# Patient Record
Sex: Male | Born: 1955 | Race: Black or African American | Hispanic: No | Marital: Single | Smoking: Current every day smoker
Health system: Southern US, Community
[De-identification: ages and names within clinical notes are randomized; demographics above are authoritative.]

## PROBLEM LIST (undated history)

## (undated) DIAGNOSIS — K4030 Unilateral inguinal hernia, with obstruction, without gangrene, not specified as recurrent: Secondary | ICD-10-CM

## (undated) DIAGNOSIS — Z9289 Personal history of other medical treatment: Secondary | ICD-10-CM

## (undated) DIAGNOSIS — F1020 Alcohol dependence, uncomplicated: Secondary | ICD-10-CM

## (undated) HISTORY — PX: NO PAST SURGERIES: SHX2092

---

## 2013-08-23 ENCOUNTER — Emergency Department: Payer: Self-pay | Admitting: Emergency Medicine

## 2013-08-23 LAB — BASIC METABOLIC PANEL
ANION GAP: 7 (ref 7–16)
BUN: 26 mg/dL — AB (ref 7–18)
CALCIUM: 9.5 mg/dL (ref 8.5–10.1)
Chloride: 96 mmol/L — ABNORMAL LOW (ref 98–107)
Co2: 25 mmol/L (ref 21–32)
Creatinine: 0.94 mg/dL (ref 0.60–1.30)
EGFR (African American): >60
EGFR (Non-African Amer.): >60
GLUCOSE: 113 mg/dL — AB (ref 65–99)
Osmolality: 263 (ref 275–301)
Potassium: 3.3 mmol/L — ABNORMAL LOW (ref 3.5–5.1)
SODIUM: 128 mmol/L — AB (ref 136–145)

## 2013-08-23 LAB — URINALYSIS, COMPLETE
BILIRUBIN, UR: NEGATIVE
Glucose,UR: NEGATIVE mg/dL (ref 0–75)
Granular Cast: 3
Leukocyte Esterase: NEGATIVE
NITRITE: NEGATIVE
Ph: 5 (ref 4.5–8.0)
SPECIFIC GRAVITY: 1.025 (ref 1.003–1.030)
Squamous Epithelial: 1

## 2013-08-23 LAB — DRUG SCREEN, URINE

## 2013-08-23 LAB — ETHANOL
Ethanol %: <0.003 % (ref 0.000–0.080)
Ethanol: <3 mg/dL

## 2013-08-23 LAB — PROTIME-INR
INR: 0.9
Prothrombin Time: 12.5 secs (ref 11.5–14.7)

## 2013-08-23 LAB — CBC
HCT: 38.3 % — ABNORMAL LOW (ref 40.0–52.0)
HGB: 13 g/dL (ref 13.0–18.0)
MCH: 33.7 pg (ref 26.0–34.0)
MCHC: 33.8 g/dL (ref 32.0–36.0)
MCV: 100 fL (ref 80–100)
PLATELETS: 106 10*3/uL — AB (ref 150–440)
RBC: 3.85 10*6/uL — ABNORMAL LOW (ref 4.40–5.90)
RDW: 13.2 % (ref 11.5–14.5)
WBC: 8.7 10*3/uL (ref 3.8–10.6)

## 2013-08-23 LAB — PHOSPHORUS: Phosphorus: 2.2 mg/dL — ABNORMAL LOW (ref 2.5–4.9)

## 2013-08-23 LAB — PRO B NATRIURETIC PEPTIDE: B-Type Natriuretic Peptide: 52 pg/mL (ref 0–125)

## 2013-08-23 LAB — MAGNESIUM: MAGNESIUM: 1.4 mg/dL — AB

## 2013-08-23 LAB — TROPONIN I

## 2013-08-28 LAB — CULTURE, BLOOD (SINGLE)

## 2016-06-21 ENCOUNTER — Inpatient Hospital Stay
Admission: EM | Admit: 2016-06-21 | Discharge: 2016-06-25 | DRG: 897 | Disposition: A | Discharge status: Home Health | Payer: Self-pay | Attending: Internal Medicine | Admitting: Internal Medicine

## 2016-06-21 ENCOUNTER — Encounter: Payer: Self-pay | Admitting: *Deleted

## 2016-06-21 ENCOUNTER — Emergency Department: Payer: Self-pay

## 2016-06-21 DIAGNOSIS — F10230 Alcohol dependence with withdrawal, uncomplicated: Principal | ICD-10-CM | POA: Diagnosis present

## 2016-06-21 DIAGNOSIS — Y93E1 Activity, personal bathing and showering: Secondary | ICD-10-CM

## 2016-06-21 DIAGNOSIS — F10939 Alcohol use, unspecified with withdrawal, unspecified: Secondary | ICD-10-CM | POA: Diagnosis present

## 2016-06-21 DIAGNOSIS — D6959 Other secondary thrombocytopenia: Secondary | ICD-10-CM | POA: Diagnosis present

## 2016-06-21 DIAGNOSIS — R197 Diarrhea, unspecified: Secondary | ICD-10-CM | POA: Diagnosis present

## 2016-06-21 DIAGNOSIS — R531 Weakness: Secondary | ICD-10-CM | POA: Diagnosis present

## 2016-06-21 DIAGNOSIS — F10239 Alcohol dependence with withdrawal, unspecified: Secondary | ICD-10-CM | POA: Diagnosis present

## 2016-06-21 DIAGNOSIS — F17210 Nicotine dependence, cigarettes, uncomplicated: Secondary | ICD-10-CM | POA: Diagnosis present

## 2016-06-21 DIAGNOSIS — R7989 Other specified abnormal findings of blood chemistry: Secondary | ICD-10-CM | POA: Diagnosis present

## 2016-06-21 DIAGNOSIS — Y92002 Bathroom of unspecified non-institutional (private) residence single-family (private) house as the place of occurrence of the external cause: Secondary | ICD-10-CM

## 2016-06-21 DIAGNOSIS — I861 Scrotal varices: Secondary | ICD-10-CM | POA: Diagnosis present

## 2016-06-21 DIAGNOSIS — N50819 Testicular pain, unspecified: Secondary | ICD-10-CM

## 2016-06-21 DIAGNOSIS — K4090 Unilateral inguinal hernia, without obstruction or gangrene, not specified as recurrent: Secondary | ICD-10-CM

## 2016-06-21 DIAGNOSIS — F10930 Alcohol use, unspecified with withdrawal, uncomplicated: Secondary | ICD-10-CM

## 2016-06-21 DIAGNOSIS — W182XXA Fall in (into) shower or empty bathtub, initial encounter: Secondary | ICD-10-CM | POA: Diagnosis present

## 2016-06-21 DIAGNOSIS — D7589 Other specified diseases of blood and blood-forming organs: Secondary | ICD-10-CM | POA: Diagnosis present

## 2016-06-21 HISTORY — DX: Alcohol dependence, uncomplicated: F10.20

## 2016-06-21 LAB — PHOSPHORUS: PHOSPHORUS: 3.7 mg/dL (ref 2.5–4.6)

## 2016-06-21 LAB — COMPREHENSIVE METABOLIC PANEL
ALBUMIN: 3.7 g/dL (ref 3.5–5.0)
ALK PHOS: 116 U/L (ref 38–126)
ALT: 86 U/L — AB (ref 17–63)
AST: 133 U/L — AB (ref 15–41)
Anion gap: 10 (ref 5–15)
BUN: 6 mg/dL (ref 6–20)
CALCIUM: 8.6 mg/dL — AB (ref 8.9–10.3)
CHLORIDE: 101 mmol/L (ref 101–111)
CO2: 24 mmol/L (ref 22–32)
CREATININE: 0.52 mg/dL — AB (ref 0.61–1.24)
GFR calc Af Amer: >60 mL/min (ref >60)
GFR calc non Af Amer: >60 mL/min (ref >60)
GLUCOSE: 96 mg/dL (ref 65–99)
Potassium: 4 mmol/L (ref 3.5–5.1)
SODIUM: 135 mmol/L (ref 135–145)
Total Bilirubin: 0.5 mg/dL (ref 0.3–1.2)
Total Protein: 7.1 g/dL (ref 6.5–8.1)

## 2016-06-21 LAB — URINALYSIS, COMPLETE (UACMP) WITH MICROSCOPIC
Bacteria, UA: NONE SEEN
Bilirubin Urine: NEGATIVE
GLUCOSE, UA: NEGATIVE mg/dL
Hgb urine dipstick: NEGATIVE
Ketones, ur: NEGATIVE mg/dL
Leukocytes, UA: NEGATIVE
Nitrite: NEGATIVE
PROTEIN: NEGATIVE mg/dL
RBC / HPF: NONE SEEN RBC/hpf (ref 0–5)
SPECIFIC GRAVITY, URINE: 1.005 (ref 1.005–1.030)
SQUAMOUS EPITHELIAL / LPF: NONE SEEN
WBC, UA: NONE SEEN WBC/hpf (ref 0–5)
pH: 6 (ref 5.0–8.0)

## 2016-06-21 LAB — CBC
HCT: 38.8 % — ABNORMAL LOW (ref 40.0–52.0)
Hemoglobin: 13.3 g/dL (ref 13.0–18.0)
MCH: 34.6 pg — AB (ref 26.0–34.0)
MCHC: 34.4 g/dL (ref 32.0–36.0)
MCV: 100.6 fL — AB (ref 80.0–100.0)
PLATELETS: 104 10*3/uL — AB (ref 150–440)
RBC: 3.86 MIL/uL — AB (ref 4.40–5.90)
RDW: 15.1 % — ABNORMAL HIGH (ref 11.5–14.5)
WBC: 4.8 10*3/uL (ref 3.8–10.6)

## 2016-06-21 LAB — AMMONIA: AMMONIA: 19 umol/L (ref 9–35)

## 2016-06-21 LAB — URINE DRUG SCREEN, QUALITATIVE (ARMC ONLY)
AMPHETAMINES, UR SCREEN: NOT DETECTED
BENZODIAZEPINE, UR SCRN: NOT DETECTED
Barbiturates, Ur Screen: NOT DETECTED
CANNABINOID 50 NG, UR ~~LOC~~: NOT DETECTED
Cocaine Metabolite,Ur ~~LOC~~: NOT DETECTED
MDMA (Ecstasy)Ur Screen: NOT DETECTED
Methadone Scn, Ur: NOT DETECTED
Opiate, Ur Screen: NOT DETECTED
PHENCYCLIDINE (PCP) UR S: NOT DETECTED
TRICYCLIC, UR SCREEN: NOT DETECTED

## 2016-06-21 LAB — ETHANOL: ALCOHOL ETHYL (B): 126 mg/dL — AB (ref <5)

## 2016-06-21 LAB — MAGNESIUM: Magnesium: 1.8 mg/dL (ref 1.7–2.4)

## 2016-06-21 LAB — LIPASE, BLOOD: Lipase: 42 U/L (ref 11–51)

## 2016-06-21 MED ORDER — LORAZEPAM 2 MG/ML IJ SOLN
1.0000 mg | Freq: Four times a day (QID) | INTRAMUSCULAR | Status: AC | PRN
  Administered 2016-06-21: 1 mg via INTRAVENOUS
  Filled 2016-06-21: qty 1

## 2016-06-21 MED ORDER — LORAZEPAM 1 MG PO TABS: 1.0000 mg | ORAL_TABLET | Freq: Four times a day (QID) | ORAL | Status: AC | PRN

## 2016-06-21 MED ORDER — LORAZEPAM 2 MG/ML IJ SOLN
4.0000 mg | Freq: Once | INTRAMUSCULAR | Status: AC
  Administered 2016-06-21: 4 mg via INTRAVENOUS
  Filled 2016-06-21: qty 2

## 2016-06-21 MED ORDER — DOCUSATE SODIUM 100 MG PO CAPS
100.0000 mg | ORAL_CAPSULE | Freq: Two times a day (BID) | ORAL | Status: DC
  Administered 2016-06-21 – 2016-06-22 (×2): 100 mg via ORAL
  Filled 2016-06-21 (×2): qty 1

## 2016-06-21 MED ORDER — LORAZEPAM 2 MG/ML IJ SOLN
0.0000 mg | Freq: Two times a day (BID) | INTRAMUSCULAR | Status: AC
  Administered 2016-06-23 – 2016-06-24 (×2): 2 mg via INTRAVENOUS
  Filled 2016-06-21: qty 1

## 2016-06-21 MED ORDER — ADULT MULTIVITAMIN W/MINERALS CH
1.0000 | ORAL_TABLET | Freq: Every day | ORAL | Status: DC
  Administered 2016-06-22 – 2016-06-25 (×4): 1 via ORAL
  Filled 2016-06-21 (×4): qty 1

## 2016-06-21 MED ORDER — TRAMADOL HCL 50 MG PO TABS: 50.0000 mg | ORAL_TABLET | Freq: Four times a day (QID) | ORAL | Status: DC | PRN

## 2016-06-21 MED ORDER — ONDANSETRON HCL 4 MG/2ML IJ SOLN
4.0000 mg | Freq: Four times a day (QID) | INTRAMUSCULAR | Status: DC | PRN
  Administered 2016-06-21: 4 mg via INTRAVENOUS
  Filled 2016-06-21: qty 2

## 2016-06-21 MED ORDER — SODIUM CHLORIDE 0.9 % IV SOLN: Freq: Once | INTRAVENOUS | Status: DC

## 2016-06-21 MED ORDER — VITAMIN B-1 100 MG PO TABS
100.0000 mg | ORAL_TABLET | Freq: Every day | ORAL | Status: DC
  Administered 2016-06-22 – 2016-06-25 (×4): 100 mg via ORAL
  Filled 2016-06-21 (×4): qty 1

## 2016-06-21 MED ORDER — THIAMINE HCL 100 MG/ML IJ SOLN: 100.0000 mg | Freq: Every day | INTRAMUSCULAR | Status: DC

## 2016-06-21 MED ORDER — LORAZEPAM 2 MG/ML IJ SOLN
0.0000 mg | Freq: Four times a day (QID) | INTRAMUSCULAR | Status: AC
  Administered 2016-06-21: 1 mg via INTRAVENOUS
  Administered 2016-06-21: 0 mg via INTRAVENOUS
  Administered 2016-06-22: 2 mg via INTRAVENOUS
  Administered 2016-06-22: 1 mg via INTRAVENOUS
  Administered 2016-06-22 – 2016-06-23 (×3): 2 mg via INTRAVENOUS
  Filled 2016-06-21 (×7): qty 1

## 2016-06-21 MED ORDER — FOLIC ACID 1 MG PO TABS
1.0000 mg | ORAL_TABLET | Freq: Every day | ORAL | Status: DC
  Administered 2016-06-22 – 2016-06-25 (×4): 1 mg via ORAL
  Filled 2016-06-21 (×4): qty 1

## 2016-06-21 MED ORDER — ENOXAPARIN SODIUM 40 MG/0.4ML ~~LOC~~ SOLN
40.0000 mg | SUBCUTANEOUS | Status: DC
  Administered 2016-06-21 – 2016-06-24 (×4): 40 mg via SUBCUTANEOUS
  Filled 2016-06-21 (×4): qty 0.4

## 2016-06-21 MED ORDER — ONDANSETRON HCL 4 MG PO TABS: 4.0000 mg | ORAL_TABLET | Freq: Four times a day (QID) | ORAL | Status: DC | PRN

## 2016-06-21 MED ORDER — LORAZEPAM 2 MG/ML IJ SOLN
2.0000 mg | Freq: Once | INTRAMUSCULAR | Status: AC
  Administered 2016-06-21: 2 mg via INTRAVENOUS
  Filled 2016-06-21: qty 1

## 2016-06-21 MED ORDER — THIAMINE HCL 100 MG/ML IJ SOLN
Freq: Once | INTRAVENOUS | Status: AC
  Administered 2016-06-21: 13:00:00 via INTRAVENOUS
  Filled 2016-06-21: qty 1000

## 2016-06-21 NOTE — ED Triage Notes (Signed)
Pt drinks a case of beer a day, pt reports diarrhea and fell last night in shower, pt denies hitting head or LOC, pt complains of swollen testicles, pt denies pain, pt speech is slurred, daughter reports speech is  normal for pt

## 2016-06-21 NOTE — ED Notes (Signed)
ED Provider at bedside. 

## 2016-06-21 NOTE — ED Provider Notes (Signed)
[facility] Emergency Department Provider Note  ____________________________________________   First MD Initiated Contact with Patient 06/21/16 1143     (approximate)  I have reviewed the triage vital signs and the nursing notes.   HISTORY  Chief Complaint Fall and Diarrhea    HPI [name] is a 61 y.o. male who comes to the emergency department with multiple issues. He says that for the past several days he "has felt kind of bad". He said that several years ago she had a pneumonia and he is concerned he may have pneumonia again. he's had no fevers or chills and no cough. He also reports 3 weeks or so of 2-3 episodes of loose watery stools a day. He drinks at least a 12 pack of beer every day and last drank this morning. He said he becomes tremulous whenever he does not drink alcohol. His daughter has 2 main concerns. She said that for the past 6 months or so he has had scrotal swelling or disease never had before. She primarily to come to the emergency department today because he slipped and fell and struck his head after getting out of the shower this morning.   Past Medical History:  Diagnosis Date  . EtOH dependence [facility])     Patient Active Problem List   Diagnosis Date Noted  . Alcohol withdrawal (HCC) 06/21/2016    History reviewed. No pertinent surgical history.  Prior to Admission medications   Not on File    Allergies Patient has no known allergies.  No family history on file.  Social History Social History  Substance Use Topics  . Smoking status: Current Every Day Smoker    Packs/day: 1.00    Types: Cigarettes  . Smokeless tobacco: Not on file  . Alcohol use Not on file    Review of Systems Constitutional: No fever/chills Eyes: No visual changes. ENT: No sore throat. Cardiovascular: Denies chest pain. Respiratory: Denies shortness of breath. Gastrointestinal: No abdominal pain.  Positive nausea, no vomiting.  No  diarrhea.  No constipation. Genitourinary: Negative for dysuria. Musculoskeletal: Negative for back pain. Skin: Negative for rash. Neurological: Negative for headaches, focal weakness or numbness.  10-point ROS otherwise negative.  ____________________________________________   PHYSICAL EXAM:  VITAL SIGNS: ED Triage Vitals  Enc Vitals Group     BP 06/21/16 1042 (!) 154/71     Pulse Rate 06/21/16 1042 94     Resp 06/21/16 1042 20     Temp 06/21/16 1042 98.8 F (37.1 C)     Temp Source 06/21/16 1042 Oral     SpO2 06/21/16 1042 97 %     Weight 06/21/16 1043 160 lb (72.6 kg)     Height 06/21/16 1043 5\' 7"  (1.702 m)     Head Circumference --      Peak Flow --      Pain Score --      Pain Loc --      Pain Edu? --      Excl. in GC? --     Constitutional: Pleasant in no distress. Speaks in slow methodical sentences. Cachectic Eyes: PERRL EOMI. Head: Atraumatic. Nose: No congestion/rhinnorhea. Mouth/Throat: No trismus significant tongue fasciculations with no lacerations Neck: No stridor.   Cardiovascular: Normal rate, regular rhythm. Grossly normal heart sounds.  Good peripheral circulation. Respiratory: Normal respiratory effort.  No retractions. Lungs CTAB and moving good air Gastrointestinal: Soft nondistended nontender no rebound no guarding no peritonitis no McBurney's tenderness negative Rovsing's no costovertebral  tenderness negative Murphy's Musculoskeletal: Moderate hand tremors Neurologic:   No gross focal neurologic deficits are appreciated. Skin:  Skin is warm, dry and intact. No rash noted.   ____________________________________________   LABS (all labs ordered are listed, but only abnormal results are displayed)  Labs Reviewed  COMPREHENSIVE METABOLIC PANEL - Abnormal; Notable for the following:       Result Value   Creatinine, Ser 0.52 (*)    Calcium 8.6 (*)    AST 133 (*)    ALT 86 (*)    All other components within normal limits  CBC - Abnormal;  Notable for the following:    RBC 3.86 (*)    HCT 38.8 (*)    MCV 100.6 (*)    MCH 34.6 (*)    RDW 15.1 (*)    Platelets 104 (*)    All other components within normal limits  URINALYSIS, COMPLETE (UACMP) WITH MICROSCOPIC - Abnormal; Notable for the following:    Color, Urine STRAW (*)    APPearance CLEAR (*)    All other components within normal limits  ETHANOL - Abnormal; Notable for the following:    Alcohol, Ethyl (B) 126 (*)    All other components within normal limits  LIPASE, BLOOD  AMMONIA  MAGNESIUM  PHOSPHORUS  URINE DRUG SCREEN, QUALITATIVE ([facility] ONLY)   ____________________________________________  EKG   ____________________________________________  RADIOLOGY  CT head with no acute disease ____________________________________________   PROCEDURES  Procedure(s) performed: no  Procedures  Critical Care performed: no  ____________________________________________   INITIAL IMPRESSION / ASSESSMENT AND PLAN / ED COURSE  Pertinent labs & imaging results that were available during my care of the patient were reviewed by me and considered in my medical decision making (see chart for details).  On arrival the patient is tremulous and cachectic. He has a significant alcohol history and says he gets the shakes whenever he stops drinking. He no longer wants to continue drinking. Head CT is fortunately negative for acute bleed. I discussed with the patient that we could either detox and outpatient or inpatient and he prefers to be admitted which I think is entirely reasonable given his escalating doses of intravenous Ativan. I discussed the case with the hospitalist was graciously agreed to admit the patient to her service.      ____________________________________________   FINAL CLINICAL IMPRESSION(S) / ED DIAGNOSES  Final diagnoses:  Alcohol withdrawal syndrome without complication (HCC)      NEW MEDICATIONS STARTED DURING THIS VISIT:  There are no  discharge medications for this patient.    Note:  This document was prepared using Dragon voice recognition software and may include unintentional dictation errors.     Merrily BrittleNeil Kenard Morawski, MD 06/22/16 1159

## 2016-06-21 NOTE — Progress Notes (Signed)
Clinical Child psychotherapistocial Worker (CSW) received ETOH consult. CSW attempted to meet with patient however he was not alert and oriented. RN at bedside reported that patient has not been able to answer questions and just mumbles. CSW will attempt to see patient at a later date.   Baker Hughes IncorporatedBailey Kahlani Graber, LCSW [phone]

## 2016-06-21 NOTE — Progress Notes (Signed)
Patient noted to have pulled out PIV. Nurse placed new PIV and documented this. New tubing hung and IV fluids started again. Patient was reoriented to place and situation and is now calm in room eating dinner. Will continue to monitor.

## 2016-06-21 NOTE — ED Notes (Signed)
Pt drinks 12-24 beers per day. Last drink 900 am. Mechanical fall in shower last night. Denies pain from fall. Here because of diarrhea.

## 2016-06-21 NOTE — H&P (Addendum)
[facility] [place] at [facility]   PATIENT NAME: [name]    MR#:  [id number]  DATE OF BIRTH:  November 02, 1955  DATE OF ADMISSION:  06/21/2016  PRIMARY CARE PHYSICIAN: No PCP Per Patient   REQUESTING/REFERRING PHYSICIAN:Dr rifenbarke  CHIEF COMPLAINT:   Fall and diarrhea.  HISTORY OF PRESENT ILLNESS:  [name]  is a 61 y.o. male with a known history ofChronic alcoholism comes to the emergency room with not feeling well and had a fall in the shower yesterday. Denies any injury. CT head negative. Patient consumes about 12 pack of beer every day and his last drink was this morning. He becomes very tremulous when he does not drink his alcohol.  No family present emergency room. Patient recently received 4 mg of IV Ativan he is sedated I am not able to wake him up. He appears to be somewhat tachycardic heart rate in the 150s. He is being admitted for alcohol withdrawal/delirium   CT head negative.  PAST MEDICAL HISTORY:   Past Medical History:  Diagnosis Date  . EtOH dependence (HCC)     PAST SURGICAL HISTOIRY:  History reviewed. No pertinent surgical history.  SOCIAL HISTORY:   Social History  Substance Use Topics  . Smoking status: Current Every Day Smoker    Packs/day: 1.00    Types: Cigarettes  . Smokeless tobacco: Not on file  . Alcohol use Not on file    FAMILY HISTORY:  No family history on file.  DRUG ALLERGIES:  No Known Allergies  REVIEW OF SYSTEMS:  Review of Systems  Unable to perform ROS: Mental status change     MEDICATIONS AT HOME:   Prior to Admission medications   Not on File      VITAL SIGNS:  Blood pressure (!) 150/79, pulse 86, temperature 98.8 F (37.1 C), temperature source Oral, resp. rate 16, height 5\' 7"  (1.702 m), weight 72.6 kg (160 lb), SpO2 93 %.  PHYSICAL EXAMINATION:  GENERAL:  61 y.o.-year-old patient lying in the bed with no acute distress.  EYES: Pupils equal, round, reactive to light and  accommodation. No scleral icterus. Extraocular muscles intact.  HEENT: Head atraumatic, normocephalic. Oropharynx and nasopharynx clear.  NECK:  Supple, no jugular venous distention. No thyroid enlargement, no tenderness.  LUNGS: Normal breath sounds bilaterally, no wheezing, rales,rhonchi or crepitation. No use of accessory muscles of respiration.  CARDIOVASCULAR: S1, S2 normal. No murmurs, rubs, or gallops. Tachycardia  ABDOMEN: Soft, nontender, nondistended. Bowel sounds present. No organomegaly or mass.  EXTREMITIES: No pedal edema, cyanosis, or clubbing.  NEUROLOGIC:Unable to assess since patient is sedated  psychiatry: The patient issedated with IV Ativan  SKIN: No obvious rash, lesion, or ulcer.   LABORATORY PANEL:   CBC  Recent Labs Lab 06/21/16 1053  WBC 4.8  HGB 13.3  HCT 38.8*  PLT 104*   ------------------------------------------------------------------------------------------------------------------  Chemistries   Recent Labs Lab 06/21/16 1053  NA 135  K 4.0  CL 101  CO2 24  GLUCOSE 96  BUN 6  CREATININE 0.52*  CALCIUM 8.6*  MG 1.8  AST 133*  ALT 86*  ALKPHOS 116  BILITOT 0.5   ------------------------------------------------------------------------------------------------------------------  Cardiac Enzymes No results for input(s): TROPONINI in the last 168 hours. ------------------------------------------------------------------------------------------------------------------  RADIOLOGY:  Dg Chest 1 View  Result Date: 06/21/2016 CLINICAL DATA:  Fall. EXAM: CHEST 1 VIEW COMPARISON:  08/23/2013 FINDINGS: Heart and mediastinal contours are within normal limits. No focal opacities or effusions. No acute bony abnormality. IMPRESSION: No  active disease. Electronically Signed   By: Charlett Nose M.D.   On: 06/21/2016 12:21   Ct Head Wo Contrast  Result Date: 06/21/2016 CLINICAL DATA:  61 year old male status post fall in shower last night. Diarrhea.  Initial encounter. EXAM: CT HEAD WITHOUT CONTRAST TECHNIQUE: Contiguous axial images were obtained from the base of the skull through the vertex without intravenous contrast. COMPARISON:  None. FINDINGS: Brain: Scattered dural calcifications. No midline shift, ventriculomegaly, mass effect, evidence of mass lesion, intracranial hemorrhage or evidence of cortically based acute infarction. Gray-white matter differentiation is within normal limits throughout the brain. No cortical encephalomalacia. Vascular: Mild Calcified atherosclerosis at the skull base. No suspicious intracranial vascular hyperdensity. Skull: No skull fracture identified. No acute osseous abnormality identified. Chronic left lamina papyracea fracture. Sinuses/Orbits: Partially visible sphenoid and maxillary mucoperiosteal thickening. Chronic left lamina papyracea fracture. Mild left ethmoid and bilateral frontal sinus mucosal thickening. Bilateral tympanic cavities and mastoids are clear. Other: No scalp hematoma identified. Visualized orbit soft tissues are within normal limits. IMPRESSION: 1. Negative non contrast appearance of the brain. No acute traumatic injury identified. 2. Chronic left lamina papyracea fracture. Partially visible chronic or acute on chronic paranasal sinusitis. Electronically Signed   By: Odessa Fleming M.D.   On: 06/21/2016 12:15    EKG:   Tachycardia noted on telemetry  IMPRESSION AND PLAN:   C[name]  is a 61 y.o. male with a known history ofChronic alcoholism comes to the emergency room with not feeling well and had a fall in the shower yesterday. Denies any injury. CT head negative. Patient consumes about 12 pack of beer every day and his last drink was this morning.   1. Acute alcohol intoxication with withdrawal/DTs -Patient presented with weakness and unsteady gait and had a fall in the shower yesterday. CT head negative. -Serum ethanol level pending. Urine drug screen pending -Per family earlier who spoke  with the ER physician patient gets goes into DTs if he does not drink. We'll admit patient to medical floor -IV Ativan -Initiate CIWA protocol  2. 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 Macrocytosis due to alcohol -Continue folic acid  4. DVT prophylaxis subcutaneous Lovenox  No family present in the emergency room. Daughter was here earlier who left according to the RN.  All the records are reviewed and case discussed with ED provider. Management plans discussed with the patient, family and they are in agreement.  CODE STATUS: full  TOTAL TIME TAKING CARE OF THIS PATIENT: *50 minutes.    Oluwatamilore Starnes M.D on 06/21/2016 at 1:46 PM  Between 7am to 6pm - Pager - [phone]  After 6pm go to [email or web address] - password [email or web address] [facility]  [facility]  Office  [phone]  CC: Primary care physician; No PCP Per Patient

## 2016-06-22 ENCOUNTER — Inpatient Hospital Stay: Payer: Self-pay

## 2016-06-22 MED ORDER — NICOTINE 21 MG/24HR TD PT24
21.0000 mg | MEDICATED_PATCH | Freq: Every day | TRANSDERMAL | Status: DC
  Administered 2016-06-22 – 2016-06-25 (×4): 21 mg via TRANSDERMAL
  Filled 2016-06-22 (×4): qty 1

## 2016-06-22 MED ORDER — TAMSULOSIN HCL 0.4 MG PO CAPS
0.4000 mg | ORAL_CAPSULE | Freq: Every day | ORAL | Status: DC
  Administered 2016-06-22 – 2016-06-24 (×3): 0.4 mg via ORAL
  Filled 2016-06-22 (×3): qty 1

## 2016-06-22 NOTE — Care Management Note (Addendum)
Case Management Note  Patient Details  Name: [name] MRN: [id number] Date of Birth: April 07, 1956  Subjective/Objective:   Met with patient at bedside to discuss discharge planning. He is uninsured. Application given for [facility] and [facility]. Referral sent to both facilities.  May need MATCH if discharges over the weekend.                Action/Plan:   Expected Discharge Date:                  Expected Discharge Plan:  Home/Self Care  In-House Referral:     Discharge planning Services  CM Consult, [facility], Medication Assistance  Post Acute Care Choice:    Choice offered to:  Patient  DME Arranged:    DME Agency:     HH Arranged:    [place] Agency:     Status of Service:  Completed, signed off  If discussed at [employer], dates discussed:    Additional Comments:  Jolly Mango, RN 06/22/2016, 2:20 PM

## 2016-06-22 NOTE — Clinical Social Work Note (Signed)
Clinical Social Work Assessment  Patient Details  Name: [name] MRN: [id number] Date of Birth: [birth year]  Date of referral:  06/22/16               Reason for consult:  Substance Use/ETOH Abuse                Permission sought to share information with:    Permission granted to share information::     Name::        Agency::     Relationship::     Contact Information:     Housing/Transportation Living arrangements for the past 2 months:  Single Family Home Source of Information:  Patient Patient Interpreter Needed:  None Criminal Activity/Legal Involvement Pertinent to Current Situation/Hospitalization:  No - Comment as needed Significant Relationships:  Adult Children Lives with:  Self Do you feel safe going back to the place where you live?  Yes Need for family participation in patient care:  Yes (Comment)  Care giving concerns:  [place]    Social Worker assessment / plan:  Holiday representative (CSW) received consult for ETOH. CSW attempted to meet with patient yesterday however he was not oriented. CSW met with patient this morning to complete assessment. Patient was alert and oriented X4 and was sitting up eating breakfast. CSW introduced self and explained role of CSW department. Patient reported that he lives alone in [name] and does not drive. Patient reported that he does not have a driver's license and is planning on taking a class to get it back. Patient reported that his daughter [name] is his primary support and provides transportation. Patient reported that he receives social security. Patient reported that he drinks a 12 pack of beer per day and denied drug use. Patient reported that he would like to get help to stop drinking. CSW provided patient with a list of outpatient substance abuse resources and talked to him about [facility] specifically. Patient appeared motivated to follow up. Patient reported that he has no health  insurance. Patient reported no other needs at this time. CSW will continue to follow and assist as needed.    Employment status:  Disabled (Comment on whether or not currently receiving Disability), Retired Forensic scientist:  Self Pay (Medicaid Pending) PT Recommendations:  Not assessed at this time Information / Referral to community resources:  Outpatient Substance Abuse Treatment Options  Patient/Family's Response to care:  Patient appeared motivated to follow up on substance abuse resources.   Patient/Family's Understanding of and Emotional Response to Diagnosis, Current Treatment, and Prognosis:  Patient was pleasant and thanked CSW for visit.   Emotional Assessment Appearance:  Appears stated age Attitude/Demeanor/Rapport:    Affect (typically observed):  Accepting, Adaptable, Pleasant Orientation:  Oriented to Self, Oriented to Place, Oriented to  Time, Oriented to Situation Alcohol / Substance use:  Alcohol Use Psych involvement (Current and /or in the community):  No (Comment)  Discharge Needs  Concerns to be addressed:  Discharge Planning Concerns Readmission within the last 30 days:  No Current discharge risk:  Substance Abuse Barriers to Discharge:  Continued Medical Work up   [employer], Veronia Beets, LCSW 06/22/2016, 9:52 AM

## 2016-06-22 NOTE — Progress Notes (Signed)
Notified Dr. Renae GlossWieting that patient has had 3 loose stools within the last couple hours. MD acknowledged. Orders placed

## 2016-06-22 NOTE — Progress Notes (Signed)
Pt continues to be impulsive, 1mg  ativan given per order. Swollen scrotom, US performed. Monitoring performed.

## 2016-06-22 NOTE — Evaluation (Signed)
Physical Therapy Evaluation Patient Details Name: [name] MRN: [id number] DOB: 08/02/1955 Today's Date: 06/22/2016   History of Present Illness  Pt is a 61 y.o. male presenting to hospital s/p fall out of shower hitting head, loose watery stools, and scrotal swelling.  Pt admitted with alcohol withdrawal/delirium.  PMH includes EtOH dependence (pt reports about 12 pack beer every day)  Clinical Impression  Prior to hospital admission, pt reports being independent with functional mobility.  Pt lives with his cousin in 1 level home with a couple steps to enter.  Currently pt is SBA with bed mobility and CGA with transfers and ambulation short distances in room with RW.  Improved steadiness with ambulation noted with distance/repetition.  Pt would benefit from skilled PT to address noted impairments and functional limitations.  Recommend pt discharge to home with support of family when medically appropriate.     Follow Up Recommendations Home health PT;Supervision for mobility/OOB    Equipment Recommendations  Rolling walker with 5" wheels    Recommendations for Other Services       Precautions / Restrictions Precautions Precautions: Fall Restrictions Weight Bearing Restrictions: No      Mobility  Bed Mobility Overal bed mobility: Needs Assistance Bed Mobility: Supine to Sit;Sit to Supine     Supine to sit: Supervision;HOB elevated Sit to supine: Supervision;HOB elevated   General bed mobility comments: no difficulty noted  Transfers Overall transfer level: Needs assistance Equipment used: Rolling walker (2 wheeled) Transfers: Sit to/from [employer] CorporationStand;Stand Pivot Transfers Sit to Stand: Min guard Stand pivot transfers: Min guard (no AD stand step turn bed to/from bedside commode)       General transfer comment: posterior lean noted upon standing 1st attempt so pt sat back down and then able to stand 2nd time (and subsequent times) without difficulty using  RW  Ambulation/Gait Ambulation/Gait assistance: Min guard Ambulation Distance (Feet):  (30 feet x3 trials) Assistive device: Rolling walker (2 wheeled)   Gait velocity: decreased   General Gait Details: pt requiring occasional cueing to stay within RW with turns; initially unsteady but improved balance noted with distance/repetition  Stairs            Wheelchair Mobility    Modified Rankin (Stroke Patients Only)       Balance Overall balance assessment: History of Falls;Needs assistance Sitting-balance support: No upper extremity supported;Feet supported Sitting balance-Leahy Scale: Good Sitting balance - Comments: sitting edge of bed reaching outside BOS   Standing balance support: Single extremity supported Standing balance-Leahy Scale: Good Standing balance comment: reaching outside BOS with other UE on RW                             Pertinent Vitals/Pain Pain Assessment: No/denies pain  Vitals stable and WFL throughout treatment session.    Home Living Family/patient expects to be discharged to:: Private residence Living Arrangements: Other relatives (Cousin)   Type of Home: House Home Access: Stairs to enter   [employer] CorporationEntrance Stairs-Number of Steps: 1-2 steps with B posts Home Layout: One level Home Equipment: None      Prior Function Level of Independence: Independent         Comments: (-) driving.  Pt denies other than most recent fall, no other falls in past 6 months.     Hand Dominance        Extremity/Trunk Assessment   Upper Extremity Assessment Upper Extremity Assessment: Generalized weakness    Lower Extremity  Assessment Lower Extremity Assessment: Generalized weakness       Communication   Communication: No difficulties  Cognition Arousal/Alertness: Awake/alert Behavior During Therapy: Impulsive Overall Cognitive Status:  (Oriented to person (pt did not answer other cognitive questions))                       General Comments   Nursing cleared pt for participation in physical therapy.  Pt agreeable to PT session.    Exercises  Transfer and functional gait training.   Assessment/Plan    PT Assessment Patient needs continued PT services  PT Problem List Decreased balance;Decreased mobility;Decreased knowledge of use of DME       PT Treatment Interventions DME instruction;Gait training;Stair training;Functional mobility training;Therapeutic activities;Therapeutic exercise;Balance training;Patient/family education    PT Goals (Current goals can be found in the Care Plan section)  Acute Rehab PT Goals Patient Stated Goal: to be able to walk more PT Goal Formulation: With patient Time For Goal Achievement: 07/06/16 Potential to Achieve Goals: Good    Frequency Min 2X/week   Barriers to discharge        Co-evaluation               End of Session Equipment Utilized During Treatment: Gait belt Activity Tolerance: Patient tolerated treatment well Patient left: in bed;with call bell/phone within reach;with bed alarm set Nurse Communication: Mobility status;Precautions PT Visit Diagnosis: History of falling (Z91.81);Other abnormalities of gait and mobility (R26.89)         Time: 1533-1610 PT Time Calculation (min) (ACUTE ONLY): 37 min   Charges:   PT Evaluation $PT Eval Low Complexity: 1 Procedure PT Treatments $Therapeutic Activity: 23-37 mins   PT G CodesHendricks Limes, PT 06/22/16, 4:51 PM [phone]

## 2016-06-22 NOTE — Progress Notes (Signed)
Patient ID: Curtis [name], male   DOB: 1955-04-24, 61 y.o.   MRN: [id number]  [facility] PROGRESS NOTE  Curtis [name] WUJ:[id number] DOB: 08-22-1955 DOA: 06/21/2016 PCP: No PCP Per Patient  HPI/Subjective: Patient feeling okay. Still very shaky. Swelling and scrotum.  Objective: Vitals:   06/22/16 0445 06/22/16 0809  BP: (!) 142/82 (!) 158/78  Pulse: 88 84  Resp: 18   Temp: 98.2 F (36.8 C) 98.2 F (36.8 C)    Filed Weights   06/21/16 1043 06/21/16 1511  Weight: 72.6 kg (160 lb) 72.6 kg (160 lb)    ROS: Review of Systems  Constitutional: Negative for chills and fever.  Eyes: Negative for blurred vision.  Respiratory: Negative for cough and shortness of breath.   Cardiovascular: Negative for chest pain.  Gastrointestinal: Negative for abdominal pain, constipation, diarrhea, nausea and vomiting.  Genitourinary: Negative for dysuria.  Musculoskeletal: Negative for joint pain.  Neurological: Negative for dizziness and headaches.   Exam: Physical Exam  HENT:  Nose: No mucosal edema.  Mouth/Throat: No oropharyngeal exudate or posterior oropharyngeal edema.  Eyes: Conjunctivae, EOM and lids are normal. Pupils are equal, round, and reactive to light.  Neck: No JVD present. Carotid bruit is not present. No edema present. No thyroid mass and no thyromegaly present.  Cardiovascular: S1 normal and S2 normal.  Exam reveals no gallop.   No murmur heard. Pulses:      Dorsalis pedis pulses are 2+ on the right side, and 2+ on the left side.  Respiratory: No respiratory distress. He has no wheezes. He has no rhonchi. He has no rales.  GI: Soft. Bowel sounds are normal. There is no tenderness.  Genitourinary:  Genitourinary Comments: Right varicocele.  Musculoskeletal:       Right ankle: He exhibits no swelling.       Left ankle: He exhibits no swelling.  Lymphadenopathy:    He has no cervical adenopathy.  Neurological: He is alert.  Skin: Skin is warm. No rash noted. Nails  show no clubbing.  Psychiatric: His affect is blunt.      Data Reviewed: Basic Metabolic Panel:  Recent Labs Lab 06/21/16 1053  NA 135  K 4.0  CL 101  CO2 24  GLUCOSE 96  BUN 6  CREATININE 0.52*  CALCIUM 8.6*  MG 1.8  PHOS 3.7   Liver Function Tests:  Recent Labs Lab 06/21/16 1053  AST 133*  ALT 86*  ALKPHOS 116  BILITOT 0.5  PROT 7.1  ALBUMIN 3.7    Recent Labs Lab 06/21/16 1053  LIPASE 42    Recent Labs Lab 06/21/16 1402  AMMONIA 19   CBC:  Recent Labs Lab 06/21/16 1053  WBC 4.8  HGB 13.3  HCT 38.8*  MCV 100.6*  PLT 104*     Studies: Dg Chest 1 View  Result Date: 06/21/2016 CLINICAL DATA:  Fall. EXAM: CHEST 1 VIEW COMPARISON:  08/23/2013 FINDINGS: Heart and mediastinal contours are within normal limits. No focal opacities or effusions. No acute bony abnormality. IMPRESSION: No active disease. Electronically Signed   By: Charlett Nose M.D.   On: 06/21/2016 12:21   Ct Head Wo Contrast  Result Date: 06/21/2016 CLINICAL DATA:  61 year old male status post fall in shower last night. Diarrhea. Initial encounter. EXAM: CT HEAD WITHOUT CONTRAST TECHNIQUE: Contiguous axial images were obtained from the base of the skull through the vertex without intravenous contrast. COMPARISON:  None. FINDINGS: Brain: Scattered dural calcifications. No midline shift, ventriculomegaly, mass effect, evidence of mass lesion,  intracranial hemorrhage or evidence of cortically based acute infarction. Gray-white matter differentiation is within normal limits throughout the brain. No cortical encephalomalacia. Vascular: Mild Calcified atherosclerosis at the skull base. No suspicious intracranial vascular hyperdensity. Skull: No skull fracture identified. No acute osseous abnormality identified. Chronic left lamina papyracea fracture. Sinuses/Orbits: Partially visible sphenoid and maxillary mucoperiosteal thickening. Chronic left lamina papyracea fracture. Mild left ethmoid and  bilateral frontal sinus mucosal thickening. Bilateral tympanic cavities and mastoids are clear. Other: No scalp hematoma identified. Visualized orbit soft tissues are within normal limits. IMPRESSION: 1. Negative non contrast appearance of the brain. No acute traumatic injury identified. 2. Chronic left lamina papyracea fracture. Partially visible chronic or acute on chronic paranasal sinusitis. Electronically Signed   By: Odessa FlemingH  Hall M.D.   On: 06/21/2016 12:15    Scheduled Meds: . sodium chloride   Intravenous Once  . enoxaparin (LOVENOX) injection  40 mg Subcutaneous Q24H  . folic acid  1 mg Oral Daily  . LORazepam  0-4 mg Intravenous Q6H   Followed by  . [START ON 06/23/2016] LORazepam  0-4 mg Intravenous Q12H  . multivitamin with minerals  1 tablet Oral Daily  . tamsulosin  0.4 mg Oral QPC supper  . thiamine  100 mg Oral Daily   Or  . thiamine  100 mg Intravenous Daily    Assessment/Plan:  1. Alcohol withdrawal. Continue [place] protocol. Continue thiamine. 2. Diarrhea. Send off stool studies. 3. Elevated liver function tests secondary to alcohol 4. Large right sided varicocele get an ultrasound of the scrotum 5. Daughter complains of urinary frequency will start Flomax 6. Weakness. Physical therapy evaluation 7. 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     Family Communication: Daughter on the phone Disposition Plan: We'll go through alcohol withdrawal here  Time spent: 28 minutes  Alford HighlandWIETING, Nehemyah Foushee  [employer]

## 2016-06-23 DIAGNOSIS — K4090 Unilateral inguinal hernia, without obstruction or gangrene, not specified as recurrent: Secondary | ICD-10-CM

## 2016-06-23 LAB — COMPREHENSIVE METABOLIC PANEL
ALT: 66 U/L — ABNORMAL HIGH (ref 17–63)
AST: 92 U/L — AB (ref 15–41)
Albumin: 3 g/dL — ABNORMAL LOW (ref 3.5–5.0)
Alkaline Phosphatase: 98 U/L (ref 38–126)
Anion gap: 8 (ref 5–15)
BILIRUBIN TOTAL: 1.2 mg/dL (ref 0.3–1.2)
BUN: 7 mg/dL (ref 6–20)
CHLORIDE: 101 mmol/L (ref 101–111)
CO2: 24 mmol/L (ref 22–32)
CREATININE: 0.39 mg/dL — AB (ref 0.61–1.24)
Calcium: 8.8 mg/dL — ABNORMAL LOW (ref 8.9–10.3)
Glucose, Bld: 111 mg/dL — ABNORMAL HIGH (ref 65–99)
Potassium: 3.5 mmol/L (ref 3.5–5.1)
Sodium: 133 mmol/L — ABNORMAL LOW (ref 135–145)
TOTAL PROTEIN: 6 g/dL — AB (ref 6.5–8.1)

## 2016-06-23 LAB — CBC
HCT: 35.8 % — ABNORMAL LOW (ref 40.0–52.0)
Hemoglobin: 12.4 g/dL — ABNORMAL LOW (ref 13.0–18.0)
MCH: 34.2 pg — AB (ref 26.0–34.0)
MCHC: 34.8 g/dL (ref 32.0–36.0)
MCV: 98.4 fL (ref 80.0–100.0)
PLATELETS: 94 10*3/uL — AB (ref 150–440)
RBC: 3.64 MIL/uL — ABNORMAL LOW (ref 4.40–5.90)
RDW: 14.5 % (ref 11.5–14.5)
WBC: 4.3 10*3/uL (ref 3.8–10.6)

## 2016-06-23 NOTE — Consult Note (Signed)
Surgical Consultation  06/23/2016  Curtis [name] is an 61 y.o. male.   CC: Right scrotal hernia  HPI: Aspect prime doc to evaluate a patient with a right scrotal hernia. Discussed with the patient it is long-standing and is never caused him any pain. He drinks a 12 pack of beer per day and has never had any obstructive symptoms or pain in his groin.  Past Medical History:  Diagnosis Date  . EtOH dependence ([place])     History reviewed. No pertinent surgical history.  No family history on file.  Social History:  reports that he has been smoking Cigarettes.  He has been smoking about 1.00 pack per day. He does not have any smokeless tobacco history on file. His alcohol and drug histories are not on file.  Allergies: No Known Allergies  Medications reviewed.   Review of Systems:   Review of Systems  Constitutional: Negative for chills, fever and weight loss.  HENT: Negative.   Eyes: Negative.   Respiratory: Negative.   Cardiovascular: Negative.   Gastrointestinal: Negative.   Genitourinary: Negative.   Musculoskeletal: Negative.   Skin: Negative.   Neurological: Negative.   Endo/Heme/Allergies: Negative.   Psychiatric/Behavioral: Negative.      Physical Exam:  BP (!) 145/80 (BP Location: Left Arm)   Pulse 91   Temp 98.8 F (37.1 C) (Oral)   Resp 18   Ht _0  (1.702 m)   Wt 160 lb (72.6 kg)   SpO2 97%   BMI 25.06 kg/m   Physical Exam  Constitutional: No distress.  Chronically ill-appearing male patient eating a regular diet at the time of exam  HENT:  Head: Normocephalic and atraumatic.  Neck: Normal range of motion.  Abdominal: Soft. He exhibits no distension. There is no tenderness. There is no rebound and no guarding.  Right scrotal hernia which is nontender  Genitourinary: Penis normal.  Genitourinary Comments: Right scrotal hernia nonerythematous nontender partially reducible normal testicles  Lymphadenopathy:    He has no cervical adenopathy.  Skin:  Skin is warm and dry. He is not diaphoretic. No erythema.  Vitals reviewed.     Results for orders placed or performed during the hospital encounter of 06/21/16 (from the past 48 hour(s))  Lipase, blood     Status: None   Collection Time: 06/21/16 10:53 AM  Result Value Ref Range   Lipase 42 11 - 51 U/L  Comprehensive metabolic panel     Status: Abnormal   Collection Time: 06/21/16 10:53 AM  Result Value Ref Range   Sodium 135 135 - 145 mmol/L   Potassium 4.0 3.5 - 5.1 mmol/L   Chloride 101 101 - 111 mmol/L   CO2 24 22 - 32 mmol/L   Glucose, Bld 96 65 - 99 mg/dL   BUN 6 6 - 20 mg/dL   Creatinine, Ser 0.52 (L) 0.61 - 1.24 mg/dL   Calcium 8.6 (L) 8.9 - 10.3 mg/dL   Total Protein 7.1 6.5 - 8.1 g/dL   Albumin 3.7 3.5 - 5.0 g/dL   AST 133 (H) 15 - 41 U/L   ALT 86 (H) 17 - 63 U/L   Alkaline Phosphatase 116 38 - 126 U/L   Total Bilirubin 0.5 0.3 - 1.2 mg/dL   GFR calc non Af Amer >60 >60 mL/min   GFR calc Af Amer >60 >60 mL/min    Comment: (NOTE) The eGFR has been calculated using the CKD EPI equation. This calculation has not been validated in all clinical situations. eGFR's  persistently <60 mL/min signify possible Chronic Kidney Disease.    Anion gap 10 5 - 15  CBC     Status: Abnormal   Collection Time: 06/21/16 10:53 AM  Result Value Ref Range   WBC 4.8 3.8 - 10.6 K/uL   RBC 3.86 (L) 4.40 - 5.90 MIL/uL   Hemoglobin 13.3 13.0 - 18.0 g/dL   HCT 38.8 (L) 40.0 - 52.0 %   MCV 100.6 (H) 80.0 - 100.0 fL   MCH 34.6 (H) 26.0 - 34.0 pg   MCHC 34.4 32.0 - 36.0 g/dL   RDW 15.1 (H) 11.5 - 14.5 %   Platelets 104 (L) 150 - 440 K/uL  Urinalysis, Complete w Microscopic     Status: Abnormal   Collection Time: 06/21/16 10:53 AM  Result Value Ref Range   Color, Urine STRAW (A) YELLOW   APPearance CLEAR (A) CLEAR   Specific Gravity, Urine 1.005 1.005 - 1.030   pH 6.0 5.0 - 8.0   Glucose, UA NEGATIVE NEGATIVE mg/dL   Hgb urine dipstick NEGATIVE NEGATIVE   Bilirubin Urine NEGATIVE  NEGATIVE   Ketones, ur NEGATIVE NEGATIVE mg/dL   Protein, ur NEGATIVE NEGATIVE mg/dL   Nitrite NEGATIVE NEGATIVE   Leukocytes, UA NEGATIVE NEGATIVE   RBC / HPF NONE SEEN 0 - 5 RBC/hpf   WBC, UA NONE SEEN 0 - 5 WBC/hpf   Bacteria, UA NONE SEEN NONE SEEN   Squamous Epithelial / LPF NONE SEEN NONE SEEN  Magnesium     Status: None   Collection Time: 06/21/16 10:53 AM  Result Value Ref Range   Magnesium 1.8 1.7 - 2.4 mg/dL  Phosphorus     Status: None   Collection Time: 06/21/16 10:53 AM  Result Value Ref Range   Phosphorus 3.7 2.5 - 4.6 mg/dL  Urine Drug Screen, Qualitative ([facility] only)     Status: None   Collection Time: 06/21/16 10:53 AM  Result Value Ref Range   Tricyclic, Ur Screen NONE DETECTED NONE DETECTED   Amphetamines, Ur Screen NONE DETECTED NONE DETECTED   MDMA (Ecstasy)Ur Screen NONE DETECTED NONE DETECTED   Cocaine Metabolite,Ur [place] NONE DETECTED NONE DETECTED   Opiate, Ur Screen NONE DETECTED NONE DETECTED   Phencyclidine (PCP) Ur S NONE DETECTED NONE DETECTED   Cannabinoid 50 Ng, Ur [place] NONE DETECTED NONE DETECTED   Barbiturates, Ur Screen NONE DETECTED NONE DETECTED   Benzodiazepine, Ur Scrn NONE DETECTED NONE DETECTED   Methadone Scn, Ur NONE DETECTED NONE DETECTED    Comment: (NOTE) 161  Tricyclics, urine               Cutoff 1000 ng/mL 200  Amphetamines, urine             Cutoff 1000 ng/mL 300  MDMA (Ecstasy), urine           Cutoff 500 ng/mL 400  Cocaine Metabolite, urine       Cutoff 300 ng/mL 500  Opiate, urine                   Cutoff 300 ng/mL 600  Phencyclidine (PCP), urine      Cutoff 25 ng/mL 700  Cannabinoid, urine              Cutoff 50 ng/mL 800  Barbiturates, urine             Cutoff 200 ng/mL 900  Benzodiazepine, urine           Cutoff 200 ng/mL 1000 Methadone, urine  Cutoff 300 ng/mL 1100 1200 The urine drug screen provides only a preliminary, unconfirmed 1300 analytical test result and should not be used for non-medical 1400  purposes. Clinical consideration and professional judgment should 1500 be applied to any positive drug screen result due to possible 1600 interfering substances. A more specific alternate chemical method 1700 must be used in order to obtain a confirmed analytical result.  1800 Gas chromato graphy / mass spectrometry (GC/MS) is the preferred 1900 confirmatory method.   Ammonia     Status: None   Collection Time: 06/21/16  2:02 PM  Result Value Ref Range   Ammonia 19 9 - 35 umol/L  Ethanol     Status: Abnormal   Collection Time: 06/21/16  2:02 PM  Result Value Ref Range   Alcohol, Ethyl (B) 126 (H) <5 mg/dL    Comment:        LOWEST DETECTABLE LIMIT FOR SERUM ALCOHOL IS 5 mg/dL FOR MEDICAL PURPOSES ONLY   CBC     Status: Abnormal   Collection Time: 06/23/16  3:14 AM  Result Value Ref Range   WBC 4.3 3.8 - 10.6 K/uL   RBC 3.64 (L) 4.40 - 5.90 MIL/uL   Hemoglobin 12.4 (L) 13.0 - 18.0 g/dL   HCT 35.8 (L) 40.0 - 52.0 %   MCV 98.4 80.0 - 100.0 fL   MCH 34.2 (H) 26.0 - 34.0 pg   MCHC 34.8 32.0 - 36.0 g/dL   RDW 14.5 11.5 - 14.5 %   Platelets 94 (L) 150 - 440 K/uL  Comprehensive metabolic panel     Status: Abnormal   Collection Time: 06/23/16  3:14 AM  Result Value Ref Range   Sodium 133 (L) 135 - 145 mmol/L   Potassium 3.5 3.5 - 5.1 mmol/L   Chloride 101 101 - 111 mmol/L   CO2 24 22 - 32 mmol/L   Glucose, Bld 111 (H) 65 - 99 mg/dL   BUN 7 6 - 20 mg/dL   Creatinine, Ser 0.39 (L) 0.61 - 1.24 mg/dL   Calcium 8.8 (L) 8.9 - 10.3 mg/dL   Total Protein 6.0 (L) 6.5 - 8.1 g/dL   Albumin 3.0 (L) 3.5 - 5.0 g/dL   AST 92 (H) 15 - 41 U/L   ALT 66 (H) 17 - 63 U/L   Alkaline Phosphatase 98 38 - 126 U/L   Total Bilirubin 1.2 0.3 - 1.2 mg/dL   GFR calc non Af Amer >60 >60 mL/min   GFR calc Af Amer >60 >60 mL/min    Comment: (NOTE) The eGFR has been calculated using the CKD EPI equation. This calculation has not been validated in all clinical situations. eGFR's persistently <60 mL/min  signify possible Chronic Kidney Disease.    Anion gap 8 5 - 15   Dg Chest 1 View  Result Date: 06/21/2016 CLINICAL DATA:  Fall. EXAM: CHEST 1 VIEW COMPARISON:  08/23/2013 FINDINGS: Heart and mediastinal contours are within normal limits. No focal opacities or effusions. No acute bony abnormality. IMPRESSION: No active disease. Electronically Signed   By: Rolm Baptise M.D.   On: 06/21/2016 12:21   Ct Head Wo Contrast  Result Date: 06/21/2016 CLINICAL DATA:  61 year old male status post fall in shower last night. Diarrhea. Initial encounter. EXAM: CT HEAD WITHOUT CONTRAST TECHNIQUE: Contiguous axial images were obtained from the base of the skull through the vertex without intravenous contrast. COMPARISON:  None. FINDINGS: Brain: Scattered dural calcifications. No midline shift, ventriculomegaly, mass effect, evidence of mass lesion, intracranial  hemorrhage or evidence of cortically based acute infarction. Gray-white matter differentiation is within normal limits throughout the brain. No cortical encephalomalacia. Vascular: Mild Calcified atherosclerosis at the skull base. No suspicious intracranial vascular hyperdensity. Skull: No skull fracture identified. No acute osseous abnormality identified. Chronic left lamina papyracea fracture. Sinuses/Orbits: Partially visible sphenoid and maxillary mucoperiosteal thickening. Chronic left lamina papyracea fracture. Mild left ethmoid and bilateral frontal sinus mucosal thickening. Bilateral tympanic cavities and mastoids are clear. Other: No scalp hematoma identified. Visualized orbit soft tissues are within normal limits. IMPRESSION: 1. Negative non contrast appearance of the brain. No acute traumatic injury identified. 2. Chronic left lamina papyracea fracture. Partially visible chronic or acute on chronic paranasal sinusitis. Electronically Signed   By: Genevie Ann M.D.   On: 06/21/2016 12:15   US Scrotum  Result Date: 06/22/2016 CLINICAL DATA:  Varicocele.   Testicular pain. EXAM: SCROTAL ULTRASOUND DOPPLER ULTRASOUND OF THE TESTICLES TECHNIQUE: Complete ultrasound examination of the testicles, epididymis, and other scrotal structures was performed. Color and spectral Doppler ultrasound were also utilized to evaluate blood flow to the testicles. COMPARISON:  None. FINDINGS: Right testicle Measurements: 4.6 x 2.2 x 3.0 cm, within normal limits. No mass or microlithiasis visualized. Left testicle Measurements: 5.2 x 1.8 x 3.2 cm, within normal limits. No mass or microlithiasis visualized. Right epididymis:  Normal in size and appearance. Left epididymis:  Normal in size and appearance. Hydrocele:  Small bilateral hydroceles are present. Varicocele:  None visualized. Pulsed Doppler interrogation of both testes demonstrates normal low resistance arterial and venous waveforms bilaterally. Peristalsing bowel can be seen within the right inguinal canal and hemiscrotum IMPRESSION: 1. Peristalsing bowel within the right inguinal canal and hemiscrotum compatible with a artery hernia. 2. No varicocele. 3. Small hydrocele bilaterally. 4. Normal arterial and venous Doppler waveforms in the testicles bilaterally. Electronically Signed   By: San Morelle M.D.   On: 06/22/2016 20:37   [place] Art/ven Flow Abd Pelv Doppler  Result Date: 06/22/2016 CLINICAL DATA:  Varicocele.  Testicular pain. EXAM: SCROTAL ULTRASOUND DOPPLER ULTRASOUND OF THE TESTICLES TECHNIQUE: Complete ultrasound examination of the testicles, epididymis, and other scrotal structures was performed. Color and spectral Doppler ultrasound were also utilized to evaluate blood flow to the testicles. COMPARISON:  None. FINDINGS: Right testicle Measurements: 4.6 x 2.2 x 3.0 cm, within normal limits. No mass or microlithiasis visualized. Left testicle Measurements: 5.2 x 1.8 x 3.2 cm, within normal limits. No mass or microlithiasis visualized. Right epididymis:  Normal in size and appearance. Left epididymis:  Normal in  size and appearance. Hydrocele:  Small bilateral hydroceles are present. Varicocele:  None visualized. Pulsed Doppler interrogation of both testes demonstrates normal low resistance arterial and venous waveforms bilaterally. Peristalsing bowel can be seen within the right inguinal canal and hemiscrotum IMPRESSION: 1. Peristalsing bowel within the right inguinal canal and hemiscrotum compatible with a artery hernia. 2. No varicocele. 3. Small hydrocele bilaterally. 4. Normal arterial and venous Doppler waveforms in the testicles bilaterally. Electronically Signed   By: San Morelle M.D.   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 Patient has no pain with this is never had any pain is never had any obstructive symptoms. His relative thrombocytopenia and is a severe alcohol abuser. I see no surgical indications at this time he can follow-up on an as-needed basis he would not be a surgical candidate with his alcoholism and thrombocytopenia at this time is discussed with Dr. Bridgett Larsson.  Jaiona Simien E  Burt Knack, MD, FACS

## 2016-06-23 NOTE — Progress Notes (Signed)
Patient ID: Curtis [name] Schafer, male   DOB: 02/06/1956, 61 y.o.   MRN: [id number]  [facility] PROGRESS NOTE  Curtis [name] Burciaga JXB:[id number] DOB: 02/06/1956 DOA: 06/21/2016 PCP: No PCP Per Patient  HPI/Subjective: Patient has tremor. Swelling and scrotum.  Objective: Vitals:   06/23/16 0735 06/23/16 1507  BP: (!) 145/80 132/74  Pulse: 91 88  Resp: 18 18  Temp: 98.8 F (37.1 C) 98.7 F (37.1 C)    Filed Weights   06/21/16 1043 06/21/16 1511  Weight: 160 lb (72.6 kg) 160 lb (72.6 kg)    ROS: Review of Systems  Constitutional: Negative for chills and fever.  Eyes: Negative for blurred vision.  Respiratory: Negative for cough and shortness of breath.   Cardiovascular: Negative for chest pain.  Gastrointestinal: Negative for abdominal pain, constipation, diarrhea, nausea and vomiting.  Genitourinary: Negative for dysuria.  Musculoskeletal: Negative for joint pain.  Neurological: Positive for tremors. Negative for dizziness and headaches.   Exam: Physical Exam  HENT:  Nose: No mucosal edema.  Mouth/Throat: No oropharyngeal exudate or posterior oropharyngeal edema.  Eyes: Conjunctivae, EOM and lids are normal. Pupils are equal, round, and reactive to light.  Neck: No JVD present. Carotid bruit is not present. No edema present. No thyroid mass and no thyromegaly present.  Cardiovascular: S1 normal and S2 normal.  Exam reveals no gallop.   No murmur heard. Pulses:      Dorsalis pedis pulses are 2+ on the right side, and 2+ on the left side.  Respiratory: No respiratory distress. He has no wheezes. He has no rhonchi. He has no rales.  GI: Soft. Bowel sounds are normal. There is no tenderness.  Genitourinary:  Genitourinary Comments: Right varicocele.  Musculoskeletal:       Right ankle: He exhibits no swelling.       Left ankle: He exhibits no swelling.  Lymphadenopathy:    He has no cervical adenopathy.  Neurological: He is alert.  Skin: Skin is warm. No rash noted. Nails  show no clubbing.  Psychiatric: His affect is blunt.      Data Reviewed: Basic Metabolic Panel:  Recent Labs Lab 06/21/16 1053 06/23/16 0314  NA 135 133*  K 4.0 3.5  CL 101 101  CO2 24 24  GLUCOSE 96 111*  BUN 6 7  CREATININE 0.52* 0.39*  CALCIUM 8.6* 8.8*  MG 1.8  --   PHOS 3.7  --    Liver Function Tests:  Recent Labs Lab 06/21/16 1053 06/23/16 0314  AST 133* 92*  ALT 86* 66*  ALKPHOS 116 98  BILITOT 0.5 1.2  PROT 7.1 6.0*  ALBUMIN 3.7 3.0*    Recent Labs Lab 06/21/16 1053  LIPASE 42    Recent Labs Lab 06/21/16 1402  AMMONIA 19   CBC:  Recent Labs Lab 06/21/16 1053 06/23/16 0314  WBC 4.8 4.3  HGB 13.3 12.4*  HCT 38.8* 35.8*  MCV 100.6* 98.4  PLT 104* 94*     Studies: [place] Scrotum  Result Date: 06/22/2016 CLINICAL DATA:  Varicocele.  Testicular pain. EXAM: SCROTAL ULTRASOUND DOPPLER ULTRASOUND OF THE TESTICLES TECHNIQUE: Complete ultrasound examination of the testicles, epididymis, and other scrotal structures was performed. Color and spectral Doppler ultrasound were also utilized to evaluate blood flow to the testicles. COMPARISON:  None. FINDINGS: Right testicle Measurements: 4.6 x 2.2 x 3.0 cm, within normal limits. No mass or microlithiasis visualized. Left testicle Measurements: 5.2 x 1.8 x 3.2 cm, within normal limits. No mass or microlithiasis visualized. Right epididymis:  Normal in size and appearance. Left epididymis:  Normal in size and appearance. Hydrocele:  Small bilateral hydroceles are present. Varicocele:  None visualized. Pulsed Doppler interrogation of both testes demonstrates normal low resistance arterial and venous waveforms bilaterally. Peristalsing bowel can be seen within the right inguinal canal and hemiscrotum IMPRESSION: 1. Peristalsing bowel within the right inguinal canal and hemiscrotum compatible with a artery hernia. 2. No varicocele. 3. Small hydrocele bilaterally. 4. Normal arterial and venous Doppler waveforms in the  testicles bilaterally. Electronically Signed   By: Marin Roberts M.D.   On: 06/22/2016 20:37   [place] Art/ven Flow Abd Pelv Doppler  Result Date: 06/22/2016 CLINICAL DATA:  Varicocele.  Testicular pain. EXAM: SCROTAL ULTRASOUND DOPPLER ULTRASOUND OF THE TESTICLES TECHNIQUE: Complete ultrasound examination of the testicles, epididymis, and other scrotal structures was performed. Color and spectral Doppler ultrasound were also utilized to evaluate blood flow to the testicles. COMPARISON:  None. FINDINGS: Right testicle Measurements: 4.6 x 2.2 x 3.0 cm, within normal limits. No mass or microlithiasis visualized. Left testicle Measurements: 5.2 x 1.8 x 3.2 cm, within normal limits. No mass or microlithiasis visualized. Right epididymis:  Normal in size and appearance. Left epididymis:  Normal in size and appearance. Hydrocele:  Small bilateral hydroceles are present. Varicocele:  None visualized. Pulsed Doppler interrogation of both testes demonstrates normal low resistance arterial and venous waveforms bilaterally. Peristalsing bowel can be seen within the right inguinal canal and hemiscrotum IMPRESSION: 1. Peristalsing bowel within the right inguinal canal and hemiscrotum compatible with a artery hernia. 2. No varicocele. 3. Small hydrocele bilaterally. 4. Normal arterial and venous Doppler waveforms in the testicles bilaterally. Electronically Signed   By: Marin Roberts M.D.   On: 06/22/2016 20:37    Scheduled Meds: . enoxaparin (LOVENOX) injection  40 mg Subcutaneous Q24H  . folic acid  1 mg Oral Daily  . LORazepam  0-4 mg Intravenous Q12H  . multivitamin with minerals  1 tablet Oral Daily  . nicotine  21 mg Transdermal Daily  . tamsulosin  0.4 mg Oral QPC supper  . thiamine  100 mg Oral Daily   Or  . thiamine  100 mg Intravenous Daily    Assessment/Plan:  1. Alcohol withdrawal. Continue Ciwa protocol. Continue thiamine. 2. Diarrhea. No BM for 2 days. 3. Elevated liver function tests  secondary to alcohol 4. Large right sided varicocele get an ultrasound of the scrotum. he would not be a surgical candidate with his alcoholism and thrombocytopenia per Dr. Excell Seltzer. 5. Daughter complains of urinary frequency, started Flomax 6. Weakness. Physical therapy evaluation: HHPT. 7. 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 Excell Seltzer. Code Status:     Code Status Orders        Start     Ordered   06/21/16 1512  Full code  Continuous     06/21/16 1511    Code Status History    Date Active Date Inactive Code Status Order ID Comments User Context   This patient has a current code status but no historical code status.     Family Communication: Daughter on the phone Disposition Plan: [place] go through alcohol withdrawal here  Time spent: 28 minutes  Shaune Pollack  [employer]

## 2016-06-24 ENCOUNTER — Encounter: Payer: Self-pay | Admitting: *Deleted

## 2016-06-24 NOTE — Progress Notes (Addendum)
Patient ID: Curtis [name], male   DOB: September 27, 1955, 61 y.o.   MRN: [id number]  [facility] PROGRESS NOTE  Curtis [name] [id number]:[id number] DOB: 09/11/1955 DOA: 06/21/2016 PCP: No PCP Per Patient  HPI/Subjective: Patient has tremor.  Objective: Vitals:   06/24/16 0458 06/24/16 0725  BP: (!) 152/76 118/72  Pulse: 82 83  Resp: 18 18  Temp: 98 F (36.7 C) 98 F (36.7 C)    Filed Weights   06/21/16 1043 06/21/16 1511  Weight: 160 lb (72.6 kg) 160 lb (72.6 kg)    ROS: Review of Systems  Constitutional: Negative for chills and fever.  Eyes: Negative for blurred vision.  Respiratory: Negative for cough and shortness of breath.   Cardiovascular: Negative for chest pain.  Gastrointestinal: Negative for abdominal pain, constipation, diarrhea, nausea and vomiting.  Genitourinary: Negative for dysuria.  Musculoskeletal: Negative for joint pain.  Neurological: Positive for tremors. Negative for dizziness and headaches.   Exam: Physical Exam  HENT:  Nose: No mucosal edema.  Mouth/Throat: No oropharyngeal exudate or posterior oropharyngeal edema.  Eyes: Conjunctivae, EOM and lids are normal. Pupils are equal, round, and reactive to light.  Neck: No JVD present. Carotid bruit is not present. No edema present. No thyroid mass and no thyromegaly present.  Cardiovascular: S1 normal and S2 normal.  Exam reveals no gallop.   No murmur heard. Pulses:      Dorsalis pedis pulses are 2+ on the right side, and 2+ on the left side.  Respiratory: No respiratory distress. He has no wheezes. He has no rhonchi. He has no rales.  GI: Soft. Bowel sounds are normal. There is no tenderness.  Genitourinary:  Genitourinary Comments: Right varicocele.  Musculoskeletal:       Right ankle: He exhibits no swelling.       Left ankle: He exhibits no swelling.  Lymphadenopathy:    He has no cervical adenopathy.  Neurological: He is alert.  Skin: Skin is warm. No rash noted. Nails show no clubbing.   Psychiatric: His affect is not blunt.      Data Reviewed: Basic Metabolic Panel:  Recent Labs Lab 06/21/16 1053 06/23/16 0314  NA 135 133*  K 4.0 3.5  CL 101 101  CO2 24 24  GLUCOSE 96 111*  BUN 6 7  CREATININE 0.52* 0.39*  CALCIUM 8.6* 8.8*  MG 1.8  --   PHOS 3.7  --    Liver Function Tests:  Recent Labs Lab 06/21/16 1053 06/23/16 0314  AST 133* 92*  ALT 86* 66*  ALKPHOS 116 98  BILITOT 0.5 1.2  PROT 7.1 6.0*  ALBUMIN 3.7 3.0*    Recent Labs Lab 06/21/16 1053  LIPASE 42    Recent Labs Lab 06/21/16 1402  AMMONIA 19   CBC:  Recent Labs Lab 06/21/16 1053 06/23/16 0314  WBC 4.8 4.3  HGB 13.3 12.4*  HCT 38.8* 35.8*  MCV 100.6* 98.4  PLT 104* 94*     Studies: US Scrotum  Result Date: 06/22/2016 CLINICAL DATA:  Varicocele.  Testicular pain. EXAM: SCROTAL ULTRASOUND DOPPLER ULTRASOUND OF THE TESTICLES TECHNIQUE: Complete ultrasound examination of the testicles, epididymis, and other scrotal structures was performed. Color and spectral Doppler ultrasound were also utilized to evaluate blood flow to the testicles. COMPARISON:  None. FINDINGS: Right testicle Measurements: 4.6 x 2.2 x 3.0 cm, within normal limits. No mass or microlithiasis visualized. Left testicle Measurements: 5.2 x 1.8 x 3.2 cm, within normal limits. No mass or microlithiasis visualized. Right epididymis:  Normal in  size and appearance. Left epididymis:  Normal in size and appearance. Hydrocele:  Small bilateral hydroceles are present. Varicocele:  None visualized. Pulsed Doppler interrogation of both testes demonstrates normal low resistance arterial and venous waveforms bilaterally. Peristalsing bowel can be seen within the right inguinal canal and hemiscrotum IMPRESSION: 1. Peristalsing bowel within the right inguinal canal and hemiscrotum compatible with a artery hernia. 2. No varicocele. 3. Small hydrocele bilaterally. 4. Normal arterial and venous Doppler waveforms in the testicles  bilaterally. Electronically Signed   By: Marin Robertshristopher  Mattern M.D.   On: 06/22/2016 20:37   [place] Art/ven Flow Abd Pelv Doppler  Result Date: 06/22/2016 CLINICAL DATA:  Varicocele.  Testicular pain. EXAM: SCROTAL ULTRASOUND DOPPLER ULTRASOUND OF THE TESTICLES TECHNIQUE: Complete ultrasound examination of the testicles, epididymis, and other scrotal structures was performed. Color and spectral Doppler ultrasound were also utilized to evaluate blood flow to the testicles. COMPARISON:  None. FINDINGS: Right testicle Measurements: 4.6 x 2.2 x 3.0 cm, within normal limits. No mass or microlithiasis visualized. Left testicle Measurements: 5.2 x 1.8 x 3.2 cm, within normal limits. No mass or microlithiasis visualized. Right epididymis:  Normal in size and appearance. Left epididymis:  Normal in size and appearance. Hydrocele:  Small bilateral hydroceles are present. Varicocele:  None visualized. Pulsed Doppler interrogation of both testes demonstrates normal low resistance arterial and venous waveforms bilaterally. Peristalsing bowel can be seen within the right inguinal canal and hemiscrotum IMPRESSION: 1. Peristalsing bowel within the right inguinal canal and hemiscrotum compatible with a artery hernia. 2. No varicocele. 3. Small hydrocele bilaterally. 4. Normal arterial and venous Doppler waveforms in the testicles bilaterally. Electronically Signed   By: Marin Robertshristopher  Mattern M.D.   On: 06/22/2016 20:37    Scheduled Meds: . enoxaparin (LOVENOX) injection  40 mg Subcutaneous Q24H  . folic acid  1 mg Oral Daily  . LORazepam  0-4 mg Intravenous Q12H  . multivitamin with minerals  1 tablet Oral Daily  . nicotine  21 mg Transdermal Daily  . tamsulosin  0.4 mg Oral QPC supper  . thiamine  100 mg Oral Daily   Or  . thiamine  100 mg Intravenous Daily    Assessment/Plan:  1. Alcohol withdrawal. Continue Ciwa protocol. Continue thiamine. 2. Diarrhea. No BM for 3 days. Discontinue C. difficile test and contact  isolation. Start constipation medication. 3. Elevated liver function tests secondary to alcohol 4. Large right sided varicocele get an ultrasound of the scrotum. he would not be a surgical candidate with his alcoholism and thrombocytopenia per Dr. Excell Seltzerooper. 5. Daughter complains of urinary frequency, started Flomax 6. Weakness. Physical therapy evaluation: HHPT. 7. 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 Smoking cessation was counseled for 4 minutes. Nicotine patch. PT: HHPT, need reevaluation. Code Status:     Code Status Orders        Start     Ordered   06/21/16 1512  Full code  Continuous     06/21/16 1511    Code Status History    Date Active Date Inactive Code Status Order ID Comments User Context   This patient has a current code status but no historical code status.     Disposition Plan:  Possible discharge to home in 1 day.  Time spent: 25 minutes  Shaune Pollackhen, Curtis [name]  [employer] [facility]

## 2016-06-25 MED ORDER — ADULT MULTIVITAMIN W/MINERALS CH: 1.0000 | ORAL_TABLET | Freq: Every day | ORAL | 0 refills | Status: DC

## 2016-06-25 MED ORDER — THIAMINE HCL 100 MG PO TABS: 100.0000 mg | ORAL_TABLET | Freq: Every day | ORAL | 0 refills | Status: DC

## 2016-06-25 MED ORDER — SENNOSIDES-DOCUSATE SODIUM 8.6-50 MG PO TABS
1.0000 | ORAL_TABLET | Freq: Two times a day (BID) | ORAL | Status: DC
  Administered 2016-06-25: 1 via ORAL
  Filled 2016-06-25: qty 1

## 2016-06-25 MED ORDER — BISACODYL 5 MG PO TBEC: 5.0000 mg | DELAYED_RELEASE_TABLET | Freq: Every day | ORAL | Status: DC | PRN

## 2016-06-25 MED ORDER — FOLIC ACID 1 MG PO TABS: 1.0000 mg | ORAL_TABLET | Freq: Every day | ORAL | 0 refills | Status: DC

## 2016-06-25 NOTE — Discharge Summary (Signed)
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 EtOH dependence (HCC)    HOSPITAL COURSE:   1. Alcohol withdrawal. on Ciwa protocol. Continue thiamine. Improved. 2. Diarrhea. No BM for 3 days. Discontinued C. difficile test and contact isolation. Started constipation medication. 3. Elevated liver function tests secondary to alcohol 4. Large right sided varicocele get an ultrasound of the scrotum. he would not be a surgical candidate with his alcoholism and thrombocytopenia per Dr. Excell Seltzerooper. 5. Daughter complains of urinary frequency, started Flomax 6. Weakness. Physical therapy evaluation: HHPT. But per CM, the patient has no PCP and insurance, cannot set up home health and PT. 7. 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 Smoking cessation was counseled for 4 minutes. Nicotine patch. DISCHARGE CONDITIONS:  Stable, discharge to home today. CONSULTS OBTAINED:   DRUG ALLERGIES:  No Known Allergies DISCHARGE MEDICATIONS:   Allergies as of 06/25/2016   No Known Allergies     Medication List    TAKE these medications   folic acid 1 MG tablet Commonly known as:  FOLVITE Take 1 tablet (1 mg total) by mouth daily.   multivitamin with minerals Tabs tablet Take 1 tablet by mouth daily.   thiamine 100 MG tablet Take 1 tablet (100 mg total) by mouth daily.        DISCHARGE INSTRUCTIONS:  See AVS.  If you experience worsening of your  admission symptoms, develop shortness of breath, life threatening emergency, suicidal or homicidal thoughts you must seek medical attention immediately by calling 911 or calling your MD immediately  if symptoms less severe.  You Must read complete instructions/literature along with all the possible adverse reactions/side effects for all the Medicines you take and that have been prescribed to you. Take any new Medicines after you have completely understood and accpet all the possible adverse reactions/side effects.   Please note  You were cared for by a hospitalist during your hospital stay. If you have any questions about your discharge medications or the care you received while you were in the hospital after you are discharged, you can call the unit and asked to speak with the hospitalist on call if the hospitalist that took care of you is not available. Once you are discharged, your primary care physician will handle any further medical issues. Please note that NO REFILLS for any discharge medications will be authorized once you are discharged, as it is imperative that you return to your primary care physician (or establish a relationship with a primary care physician if you do not have one) for your aftercare needs so that they can reassess your need for medications and monitor your lab values.    On the day of Discharge:  VITAL SIGNS:  Blood pressure 115/63, pulse 85, temperature 97.8 F (36.6 C), temperature source Oral, resp. rate 16, height 5\' 7"  (1.702 m), weight 160 lb (72.6 kg), SpO2 97 %. PHYSICAL EXAMINATION:  GENERAL:  61 y.o.-year-old patient lying in the bed with no acute distress.  EYES: Pupils equal, round, reactive to light and accommodation. No scleral icterus. Extraocular muscles intact.  HEENT: Head atraumatic, normocephalic. Oropharynx and nasopharynx clear.  NECK:  Supple, no jugular venous distention. No thyroid enlargement, no tenderness.  LUNGS: Normal breath sounds  bilaterally, no wheezing, rales,rhonchi or crepitation. No use of accessory muscles of respiration.  CARDIOVASCULAR: S1, S2 normal. No murmurs, rubs, or gallops.  ABDOMEN: Soft, non-tender, non-distended. Bowel sounds present. No organomegaly or mass.  EXTREMITIES: No pedal edema, cyanosis, or clubbing.  NEUROLOGIC: Cranial nerves II through XII are intact. Muscle strength 5/5 in all extremities. Sensation intact. Gait not checked.  PSYCHIATRIC: The patient is alert and oriented x 3.  SKIN: No obvious rash, lesion, or ulcer.  DATA REVIEW:   CBC  Recent Labs Lab 06/23/16 0314  WBC 4.3  HGB 12.4*  HCT 35.8*  PLT 94*    Chemistries   Recent Labs Lab 06/21/16 1053 06/23/16 0314  NA 135 133*  K 4.0 3.5  CL 101 101  CO2 24 24  GLUCOSE 96 111*  BUN 6 7  CREATININE 0.52* 0.39*  CALCIUM 8.6* 8.8*  MG 1.8  --   AST 133* 92*  ALT 86* 66*  ALKPHOS 116 98  BILITOT 0.5 1.2     Microbiology Results  Results for orders placed or performed in visit on 08/23/13  Culture, blood (single)     Status: None   Collection Time: 08/23/13  6:56 PM  Result Value Ref Range Status   Micro Text Report   Final       COMMENT                   NO GROWTH AEROBICALLY/ANAEROBICALLY IN 5 DAYS   ANTIBIOTIC                                                      Culture, blood (single)     Status: None   Collection Time: 08/23/13  6:56 PM  Result Value Ref Range Status   Micro Text Report   Final       COMMENT                   NO GROWTH AEROBICALLY/ANAEROBICALLY IN 5 DAYS   ANTIBIOTIC                                                        RADIOLOGY:  No results found.   Management plans discussed with the patient, family and they are in agreement.  CODE STATUS: Full Code   TOTAL TIME TAKING CARE OF THIS PATIENT: 32 minutes.    Shaune Pollack M.D on 06/25/2016 at 2:05 PM  Between 7am to 6pm - Pager - [phone]  After 6pm go to [email or web address] - Scientist, research (life sciences)  [facility] [facility]  Office  [phone]  CC: Primary care physician; No PCP Per Patient   Note: This dictation was prepared with Dragon dictation along with smaller phrase technology. Any transcriptional errors that result from this process are unintentional.

## 2016-06-25 NOTE — Discharge Instructions (Signed)
Smoking cessation. Alcohol detox

## 2016-06-25 NOTE — Progress Notes (Signed)
Patient resting quietly, awaiting lunch- states that his Aunt will be by this afternoon to pick him up. Patient aware of pending discharge- denies any pain of discomfort at this time. Will continue to monitor.

## 2016-06-25 NOTE — Progress Notes (Signed)
Patient being discharged, gave permission for this RN to speak with Aunt who is listed as an Emergency Contact. Aunt notified of patient discharge and the need for transportation from hospital. Aunt states that someone from the family will come and pick the patient up.

## 2016-06-25 NOTE — Progress Notes (Signed)
Patient's second cousin arrived to transport patient home via private vehicle, discharge instructions reviewed with patient and family with patient's permission. Rx's given to patient, patient verbalized understanding of all discharge instructions and states that he plans to stay with family until he is able to get his strength up. All belongings sent with patient including cell phone and charger. Patient left the unit via wheelchair, escorted by NT to vehicle.

## 2016-06-26 ENCOUNTER — Inpatient Hospital Stay
Admission: EM | Admit: 2016-06-26 | Discharge: 2016-06-28 | DRG: 897 | Disposition: A | Discharge status: Home / Self Care | Payer: Self-pay | Attending: Internal Medicine | Admitting: Internal Medicine

## 2016-06-26 ENCOUNTER — Inpatient Hospital Stay: Payer: Self-pay

## 2016-06-26 ENCOUNTER — Encounter: Payer: Self-pay | Admitting: *Deleted

## 2016-06-26 DIAGNOSIS — Y900 Blood alcohol level of less than 20 mg/100 ml: Secondary | ICD-10-CM | POA: Diagnosis present

## 2016-06-26 DIAGNOSIS — F10229 Alcohol dependence with intoxication, unspecified: Secondary | ICD-10-CM | POA: Diagnosis present

## 2016-06-26 DIAGNOSIS — F10939 Alcohol use, unspecified with withdrawal, unspecified: Secondary | ICD-10-CM | POA: Diagnosis present

## 2016-06-26 DIAGNOSIS — F10239 Alcohol dependence with withdrawal, unspecified: Principal | ICD-10-CM | POA: Diagnosis present

## 2016-06-26 DIAGNOSIS — G4089 Other seizures: Secondary | ICD-10-CM | POA: Diagnosis present

## 2016-06-26 DIAGNOSIS — R569 Unspecified convulsions: Secondary | ICD-10-CM

## 2016-06-26 DIAGNOSIS — F17210 Nicotine dependence, cigarettes, uncomplicated: Secondary | ICD-10-CM | POA: Diagnosis present

## 2016-06-26 LAB — GLUCOSE, CAPILLARY: GLUCOSE-CAPILLARY: 112 mg/dL — AB (ref 65–99)

## 2016-06-26 LAB — URINALYSIS, COMPLETE (UACMP) WITH MICROSCOPIC
BACTERIA UA: NONE SEEN
Bilirubin Urine: NEGATIVE
GLUCOSE, UA: NEGATIVE mg/dL
KETONES UR: NEGATIVE mg/dL
Leukocytes, UA: NEGATIVE
Nitrite: NEGATIVE
Protein, ur: 100 mg/dL — AB
Specific Gravity, Urine: 1.02 (ref 1.005–1.030)
pH: 5 (ref 5.0–8.0)

## 2016-06-26 LAB — URINE DRUG SCREEN, QUALITATIVE (ARMC ONLY)
AMPHETAMINES, UR SCREEN: NOT DETECTED
BARBITURATES, UR SCREEN: NOT DETECTED
Benzodiazepine, Ur Scrn: NOT DETECTED
CANNABINOID 50 NG, UR ~~LOC~~: NOT DETECTED
COCAINE METABOLITE, UR ~~LOC~~: NOT DETECTED
MDMA (Ecstasy)Ur Screen: NOT DETECTED
Methadone Scn, Ur: NOT DETECTED
OPIATE, UR SCREEN: NOT DETECTED
Phencyclidine (PCP) Ur S: NOT DETECTED
TRICYCLIC, UR SCREEN: NOT DETECTED

## 2016-06-26 LAB — COMPREHENSIVE METABOLIC PANEL
ALBUMIN: 3.5 g/dL (ref 3.5–5.0)
ALK PHOS: 105 U/L (ref 38–126)
ALT: 63 U/L (ref 17–63)
AST: 54 U/L — AB (ref 15–41)
Anion gap: 8 (ref 5–15)
BILIRUBIN TOTAL: 0.6 mg/dL (ref 0.3–1.2)
BUN: 8 mg/dL (ref 6–20)
CALCIUM: 9.3 mg/dL (ref 8.9–10.3)
CO2: 23 mmol/L (ref 22–32)
Chloride: 101 mmol/L (ref 101–111)
Creatinine, Ser: 0.47 mg/dL — ABNORMAL LOW (ref 0.61–1.24)
GFR calc Af Amer: >60 mL/min (ref >60)
GFR calc non Af Amer: >60 mL/min (ref >60)
GLUCOSE: 121 mg/dL — AB (ref 65–99)
Potassium: 3.9 mmol/L (ref 3.5–5.1)
Sodium: 132 mmol/L — ABNORMAL LOW (ref 135–145)
TOTAL PROTEIN: 7.1 g/dL (ref 6.5–8.1)

## 2016-06-26 LAB — CBC
HEMATOCRIT: 37.2 % — AB (ref 40.0–52.0)
Hemoglobin: 12.8 g/dL — ABNORMAL LOW (ref 13.0–18.0)
MCH: 34.7 pg — ABNORMAL HIGH (ref 26.0–34.0)
MCHC: 34.3 g/dL (ref 32.0–36.0)
MCV: 101 fL — AB (ref 80.0–100.0)
PLATELETS: 138 10*3/uL — AB (ref 150–440)
RBC: 3.68 MIL/uL — AB (ref 4.40–5.90)
RDW: 14.6 % — ABNORMAL HIGH (ref 11.5–14.5)
WBC: 5.1 10*3/uL (ref 3.8–10.6)

## 2016-06-26 LAB — ETHANOL: Alcohol, Ethyl (B): 5 mg/dL — ABNORMAL HIGH (ref <5)

## 2016-06-26 MED ORDER — FOLIC ACID 1 MG PO TABS
1.0000 mg | ORAL_TABLET | Freq: Every day | ORAL | Status: DC
  Administered 2016-06-27 – 2016-06-28 (×2): 1 mg via ORAL
  Filled 2016-06-26 (×2): qty 1

## 2016-06-26 MED ORDER — SODIUM CHLORIDE 0.9 % IV BOLUS (SEPSIS)
1000.0000 mL | Freq: Once | INTRAVENOUS | Status: AC
  Administered 2016-06-26: 1000 mL via INTRAVENOUS

## 2016-06-26 MED ORDER — LORAZEPAM 2 MG/ML IJ SOLN
1.0000 mg | Freq: Once | INTRAMUSCULAR | Status: AC
Start: 2016-06-26 — End: 2016-06-26
  Administered 2016-06-26: 1 mg via INTRAVENOUS

## 2016-06-26 MED ORDER — ACETAMINOPHEN 650 MG RE SUPP: 650.0000 mg | Freq: Four times a day (QID) | RECTAL | Status: DC | PRN

## 2016-06-26 MED ORDER — LORAZEPAM 2 MG/ML IJ SOLN: 1.0000 mg | Freq: Four times a day (QID) | INTRAMUSCULAR | Status: DC | PRN

## 2016-06-26 MED ORDER — THIAMINE HCL 100 MG/ML IJ SOLN
100.0000 mg | Freq: Once | INTRAMUSCULAR | Status: AC
  Administered 2016-06-26: 100 mg via INTRAVENOUS
  Filled 2016-06-26: qty 2

## 2016-06-26 MED ORDER — LORAZEPAM 2 MG/ML IJ SOLN: 1.0000 mg | INTRAMUSCULAR | Status: DC | PRN

## 2016-06-26 MED ORDER — ADULT MULTIVITAMIN W/MINERALS CH
1.0000 | ORAL_TABLET | Freq: Every day | ORAL | Status: DC
  Administered 2016-06-27 – 2016-06-28 (×2): 1 via ORAL
  Filled 2016-06-26 (×2): qty 1

## 2016-06-26 MED ORDER — ONDANSETRON HCL 4 MG/2ML IJ SOLN: 4.0000 mg | Freq: Four times a day (QID) | INTRAMUSCULAR | Status: DC | PRN

## 2016-06-26 MED ORDER — CHLORDIAZEPOXIDE HCL 25 MG PO CAPS: 50.0000 mg | ORAL_CAPSULE | Freq: Once | ORAL | Status: DC

## 2016-06-26 MED ORDER — ONDANSETRON HCL 4 MG PO TABS: 4.0000 mg | ORAL_TABLET | Freq: Four times a day (QID) | ORAL | Status: DC | PRN

## 2016-06-26 MED ORDER — CHLORDIAZEPOXIDE HCL 25 MG PO CAPS: ORAL_CAPSULE | ORAL | 0 refills | Status: DC

## 2016-06-26 MED ORDER — VITAMIN B-1 100 MG PO TABS
100.0000 mg | ORAL_TABLET | Freq: Every day | ORAL | Status: DC
  Administered 2016-06-27 – 2016-06-28 (×2): 100 mg via ORAL
  Filled 2016-06-26 (×2): qty 1

## 2016-06-26 MED ORDER — ACETAMINOPHEN 325 MG PO TABS: 650.0000 mg | ORAL_TABLET | Freq: Four times a day (QID) | ORAL | Status: DC | PRN

## 2016-06-26 MED ORDER — ENOXAPARIN SODIUM 40 MG/0.4ML ~~LOC~~ SOLN
40.0000 mg | SUBCUTANEOUS | Status: DC
  Administered 2016-06-26 – 2016-06-27 (×2): 40 mg via SUBCUTANEOUS
  Filled 2016-06-26 (×2): qty 0.4

## 2016-06-26 MED ORDER — SODIUM CHLORIDE 0.9 % IV SOLN
INTRAVENOUS | Status: DC
  Administered 2016-06-26: 23:00:00 via INTRAVENOUS

## 2016-06-26 MED ORDER — LORAZEPAM 1 MG PO TABS
1.0000 mg | ORAL_TABLET | Freq: Four times a day (QID) | ORAL | Status: DC | PRN
  Administered 2016-06-27: 1 mg via ORAL
  Filled 2016-06-26: qty 1

## 2016-06-26 MED ORDER — LORAZEPAM 2 MG/ML IJ SOLN
INTRAMUSCULAR | Status: AC
  Filled 2016-06-26: qty 1

## 2016-06-26 MED ORDER — LORAZEPAM 2 MG/ML IJ SOLN: 0.0000 mg | Freq: Two times a day (BID) | INTRAMUSCULAR | Status: DC

## 2016-06-26 MED ORDER — THIAMINE HCL 100 MG/ML IJ SOLN: 100.0000 mg | Freq: Every day | INTRAMUSCULAR | Status: DC

## 2016-06-26 MED ORDER — SODIUM CHLORIDE 0.9% FLUSH
3.0000 mL | Freq: Two times a day (BID) | INTRAVENOUS | Status: DC
  Administered 2016-06-26 – 2016-06-28 (×4): 3 mL via INTRAVENOUS

## 2016-06-26 MED ORDER — LORAZEPAM 2 MG/ML IJ SOLN
0.0000 mg | Freq: Four times a day (QID) | INTRAMUSCULAR | Status: DC
  Administered 2016-06-26: 1 mg via INTRAVENOUS
  Administered 2016-06-27: 05:00:00 2 mg via INTRAVENOUS
  Filled 2016-06-26: qty 2
  Filled 2016-06-26: qty 1

## 2016-06-26 NOTE — ED Notes (Signed)
Patient had two witnessed seizures back to back. Dr. Don PerkingVeronese at bedside. Patient was incontinent to urine.

## 2016-06-26 NOTE — ED Triage Notes (Signed)
Per EMS report, patient's daughter witnessed patient having a seizure today. Patient was [facility] and [facility] for [facility]. Patient states he can't remember the seizure. Patient states he last drank alcohol yesterday. Patient is alert and oriented upon arrival here and has a steady gait. EMT report that patient was unsteady when walking and appeared confused when the EMTs arrived to his residence.

## 2016-06-26 NOTE — ED Provider Notes (Addendum)
[facility] Emergency Department Provider Note  ____________________________________________  Time seen: Approximately 6:03 PM  I have reviewed the triage vital signs and the nursing notes.   HISTORY  Chief Complaint Seizures   HPI Curtis [name] is a 61 y.o. male with a history of alcohol abuse who presents for evaluation of seizure. Patient was discharged from the hospital yesterday after a 4 day admission for alcohol detox. Patient sustained no complications during the admission with low CIWA scores and no seizure or DTs. He was sent home on thiamine, folate, and a multivitamin yesterday. The minute patient arrived home he started drinking again. His last drink was this afternoon. He was sitting on the couch watching TV when he had a seizure that was witnessed by his daughter. She reports that he had total body shaking activity lasting 5-6 minutes, he was foaming through the mouth and was screaming. Patient does not remember this episode. Patient was then confused when he stopped convulsing. When EMS arrived patient was back to his baseline. Patient denies any recollection of this event. Patient denies any drug use. No headache, no fever or chills, no anxiety, no nausea, no vomiting, no chest pain or shortness of breath, no abdominal pain. No tongue trauma, no urinary or bowel incontinence.  Past Medical History:  Diagnosis Date  . EtOH dependence [facility])     Patient Active Problem List   Diagnosis Date Noted  . Non-recurrent unilateral inguinal hernia without obstruction or gangrene   . Alcohol withdrawal (HCC) 06/21/2016    Past Surgical History:  Procedure Laterality Date  . NO PAST SURGERIES      Prior to Admission medications   Medication Sig Start Date End Date Taking? Authorizing Provider  chlordiazePOXIDE (LIBRIUM) 25 MG capsule 50 mg 3 times a day x 3 days 50 mg 2 times a day for 3 days 50 mg once a day for 3 days stop 06/26/16   Nita Sickle, MD  folic acid (FOLVITE) 1 MG tablet Take 1 tablet (1 mg total) by mouth daily. 06/25/16   Shaune Pollack, MD  Multiple Vitamin (MULTIVITAMIN WITH MINERALS) TABS tablet Take 1 tablet by mouth daily. 06/25/16   Shaune Pollack, MD  thiamine 100 MG tablet Take 1 tablet (100 mg total) by mouth daily. 06/25/16   Shaune Pollack, MD    Allergies Patient has no known allergies.  No family history on file.  Social History Social History  Substance Use Topics  . Smoking status: Current Every Day Smoker    Packs/day: 1.00    Types: Cigarettes  . Smokeless tobacco: Never Used  . Alcohol use 75.6 oz/week    126 Cans of beer per week    Review of Systems  Constitutional: Negative for fever. Eyes: Negative for visual changes. ENT: Negative for sore throat. Neck: No neck pain  Cardiovascular: Negative for chest pain. Respiratory: Negative for shortness of breath. Gastrointestinal: Negative for abdominal pain, vomiting or diarrhea. Genitourinary: Negative for dysuria. Musculoskeletal: Negative for back pain. Skin: Negative for rash. Neurological: Negative for headaches, weakness or numbness. + seizure Psych: No SI or HI  ____________________________________________   PHYSICAL EXAM:  VITAL SIGNS: ED Triage Vitals  Enc Vitals Group     BP 06/26/16 1752 (!) 141/76     Pulse Rate 06/26/16 1752 100     Resp 06/26/16 1752 18     Temp 06/26/16 1752 98 F (36.7 C)     Temp Source 06/26/16 1752 Oral  SpO2 06/26/16 1748 98 %     Weight 06/26/16 1753 150 lb (68 kg)     Height 06/26/16 1753 5\' 7"  (1.702 m)     Head Circumference --      Peak Flow --      Pain Score --      Pain Loc --      Pain Edu? --      Excl. in GC? --     Constitutional: Alert and oriented. Well appearing and in no apparent distress. HEENT:      Head: Normocephalic and atraumatic.         Eyes: Conjunctivae are normal. Sclera is non-icteric. EOMI. PERRL      Mouth/Throat: Mucous membranes are moist.       Neck:  Supple with no signs of meningismus. Cardiovascular: Regular rate and rhythm. No murmurs, gallops, or rubs. 2+ symmetrical distal pulses are present in all extremities. No JVD. Respiratory: Normal respiratory effort. Lungs are clear to auscultation bilaterally. No wheezes, crackles, or rhonchi.  Gastrointestinal: Soft, non tender, and non distended with positive bowel sounds. No rebound or guarding. Musculoskeletal: Nontender with normal range of motion in all extremities. No edema, cyanosis, or erythema of extremities. Neurologic: Normal speech and language. A & O x3, PERRL, no nystagmus, CN II-XII intact, motor testing reveals good tone and bulk throughout. There is no evidence of pronator drift or dysmetria. Muscle strength is 5/5 throughout. Deep tendon reflexes are 2+ throughout with downgoing toes. Sensory examination is intact. Gait is normal. Skin: Skin is warm, dry and intact. No rash noted. Psychiatric: Mood and affect are normal. Speech and behavior are normal.  ____________________________________________   LABS (all labs ordered are listed, but only abnormal results are displayed)  Labs Reviewed  CBC - Abnormal; Notable for the following:       Result Value   RBC 3.68 (*)    Hemoglobin 12.8 (*)    HCT 37.2 (*)    MCV 101.0 (*)    MCH 34.7 (*)    RDW 14.6 (*)    Platelets 138 (*)    All other components within normal limits  ETHANOL - Abnormal; Notable for the following:    Alcohol, Ethyl (B) 5 (*)    All other components within normal limits  GLUCOSE, CAPILLARY - Abnormal; Notable for the following:    Glucose-Capillary 112 (*)    All other components within normal limits  COMPREHENSIVE METABOLIC PANEL - Abnormal; Notable for the following:    Sodium 132 (*)    Glucose, Bld 121 (*)    Creatinine, Ser 0.47 (*)    AST 54 (*)    All other components within normal limits  URINE DRUG SCREEN, QUALITATIVE ([facility] ONLY)  CBG MONITORING, ED    ____________________________________________  EKG  ED ECG REPORT I, Nita Sicklearolina Bobak Oguinn, the attending physician, personally viewed and interpreted this ECG.  Sinus tachycardia, rate of 102, normal intervals, normal axis, no ST elevations or depressions.  ____________________________________________  RADIOLOGY  none  ____________________________________________   PROCEDURES  Procedure(s) performed: None Procedures Critical Care performed:  None ____________________________________________   INITIAL IMPRESSION / ASSESSMENT AND PLAN / ED COURSE   61 y.o. male with a history of alcohol abuse who presents for evaluation of seizure while intoxicated. Patient with no recollection of the event. CIWA of 0. Last drink this afternoon. Normal neuro exam. Alcohol level 5. Discharged yesterday after 4 day admission for alcohol detox which was uncomplicated. Labs with no acute findings.  Patient remains at baseline. Ambulating with no difficulty. Observed in the ED for 2 hours with no further episodes of seizure or complicated withdrawals. Will dc home at the care of his daughter. Discuss signs of complicated withdrawal with patient and daughter recommended return to the emergency room if these develop. Also discussed seizure precautions with patient and his daughter. Patient no longer drives. Recommended the patient takes the medications that he was discharged on which include thiamine, multivitamin, and folate.   Clinical Course as of Jun 26 2012  Tue Jun 26, 2016  2011 As I walked in the room to discharge patient he had two seizures lasting a few minutes each with tongue trauma. Patient received IV ativan with resolution of the seizure. Will admit patient  [CV]    Clinical Course User Index [CV] Nita Sickle, MD    Pertinent labs & imaging results that were available during my care of the patient were reviewed by me and considered in my medical decision making (see chart for  details).    ____________________________________________   FINAL CLINICAL IMPRESSION(S) / ED DIAGNOSES  Final diagnoses:  Alcohol dependence with intoxication with complication (HCC)  Seizure (HCC)      NEW MEDICATIONS STARTED DURING THIS VISIT:  New Prescriptions   CHLORDIAZEPOXIDE (LIBRIUM) 25 MG CAPSULE    50 mg 3 times a day x 3 days 50 mg 2 times a day for 3 days 50 mg once a day for 3 days stop     Note:  This document was prepared using Dragon voice recognition software and may include unintentional dictation errors.    Nita Sickle, MD 06/26/16 2002    Nita Sickle, MD 06/26/16 2014

## 2016-06-26 NOTE — H&P (Signed)
[facility] - [place] at [facility]   PATIENT NAME: Curtis [name]    MR#:  [id number]  DATE OF BIRTH:  March 10, 1956  DATE OF ADMISSION:  06/26/2016  PRIMARY CARE PHYSICIAN: No PCP Per Patient   REQUESTING/REFERRING PHYSICIAN: Don Perking, MD  CHIEF COMPLAINT:   Chief Complaint  Patient presents with  . Seizures    HISTORY OF PRESENT ILLNESS:  Curtis [name]  is a 61 y.o. male who presents with Seizures. Patient was discharged from our facility after several days stay for alcohol withdrawal. At home one day after discharge he had a seizure episode, described by his daughter as whole body jerking motions. Patient states he does not recall the episode. He states that he had a couple of beers since he got home, but was just watching TV in the afternoon when he had the seizure episode. Here in the ED his alcohol level was 5, and his other labs were largely within normal limits. He had 2 more seizure episodes here, and hospitalists were called for admission and further evaluation.  PAST MEDICAL HISTORY:   Past Medical History:  Diagnosis Date  . EtOH dependence (HCC)     PAST SURGICAL HISTORY:   Past Surgical History:  Procedure Laterality Date  . NO PAST SURGERIES      SOCIAL HISTORY:   Social History  Substance Use Topics  . Smoking status: Current Every Day Smoker    Packs/day: 1.00    Types: Cigarettes  . Smokeless tobacco: Never Used  . Alcohol use 75.6 oz/week    126 Cans of beer per week    FAMILY HISTORY:   Family History  Problem Relation Age of Onset  . Family history unknown: Yes    DRUG ALLERGIES:  No Known Allergies  MEDICATIONS AT HOME:   Prior to Admission medications   Medication Sig Start Date End Date Taking? Authorizing Provider  folic acid (FOLVITE) 1 MG tablet Take 1 tablet (1 mg total) by mouth daily. 06/25/16  Yes Shaune Pollack, MD  Multiple Vitamin (MULTIVITAMIN WITH MINERALS) TABS tablet Take 1 tablet by mouth daily.  06/25/16  Yes Shaune Pollack, MD  thiamine 100 MG tablet Take 1 tablet (100 mg total) by mouth daily. 06/25/16  Yes Shaune Pollack, MD  chlordiazePOXIDE (LIBRIUM) 25 MG capsule 50 mg 3 times a day x 3 days 50 mg 2 times a day for 3 days 50 mg once a day for 3 days stop 06/26/16   Nita Sickle, MD    REVIEW OF SYSTEMS:  Review of Systems  Constitutional: Negative for chills, fever, malaise/fatigue and weight loss.  HENT: Negative for ear pain, hearing loss and tinnitus.   Eyes: Negative for blurred vision, double vision, pain and redness.  Respiratory: Negative for cough, hemoptysis and shortness of breath.   Cardiovascular: Negative for chest pain, palpitations, orthopnea and leg swelling.  Gastrointestinal: Negative for abdominal pain, constipation, diarrhea, nausea and vomiting.  Genitourinary: Negative for dysuria, frequency and hematuria.  Musculoskeletal: Negative for back pain, joint pain and neck pain.  Skin:       No acne, rash, or lesions  Neurological: Positive for seizures. Negative for dizziness, tremors, focal weakness and weakness.  Endo/Heme/Allergies: Negative for polydipsia. Does not bruise/bleed easily.  Psychiatric/Behavioral: Negative for depression. The patient is not nervous/anxious and does not have insomnia.      VITAL SIGNS:   Vitals:   06/26/16 1748 06/26/16 1752 06/26/16 1753 06/26/16 2004  BP:  (!) 141/76  (!) 143/71  Pulse:  100  99  Resp:  18  18  Temp:  98 F (36.7 C)    TempSrc:  Oral    SpO2: 98% 97%  98%  Weight:   68 kg (150 lb)   Height:   5\' 7"  (1.702 m)    Wt Readings from Last 3 Encounters:  06/26/16 68 kg (150 lb)  06/21/16 72.6 kg (160 lb)    PHYSICAL EXAMINATION:  Physical Exam  Vitals reviewed. Constitutional: He is oriented to person, place, and time. He appears well-developed and well-nourished. No distress.  HENT:  Head: Normocephalic and atraumatic.  Mouth/Throat: Oropharynx is clear and moist.  Eyes: Conjunctivae and EOM are  normal. Pupils are equal, round, and reactive to light. No scleral icterus.  Neck: Normal range of motion. Neck supple. No JVD present. No thyromegaly present.  Cardiovascular: Normal rate, regular rhythm and intact distal pulses.  Exam reveals no gallop and no friction rub.   No murmur heard. Respiratory: Effort normal and breath sounds normal. No respiratory distress. He has no wheezes. He has no rales.  GI: Soft. Bowel sounds are normal. He exhibits no distension. There is no tenderness.  Musculoskeletal: Normal range of motion. He exhibits no edema.  No arthritis, no gout  Lymphadenopathy:    He has no cervical adenopathy.  Neurological: He is alert and oriented to person, place, and time. No cranial nerve deficit.  No dysarthria, no aphasia  Skin: Skin is warm and dry. No rash noted. No erythema.  Psychiatric: He has a normal mood and affect. His behavior is normal. Judgment and thought content normal.    LABORATORY PANEL:   CBC  Recent Labs Lab 06/26/16 1817  WBC 5.1  HGB 12.8*  HCT 37.2*  PLT 138*   ------------------------------------------------------------------------------------------------------------------  Chemistries   Recent Labs Lab 06/21/16 1053  06/26/16 1906  NA 135  < > 132*  K 4.0  < > 3.9  CL 101  < > 101  CO2 24  < > 23  GLUCOSE 96  < > 121*  BUN 6  < > 8  CREATININE 0.52*  < > 0.47*  CALCIUM 8.6*  < > 9.3  MG 1.8  --   --   AST 133*  < > 54*  ALT 86*  < > 63  ALKPHOS 116  < > 105  BILITOT 0.5  < > 0.6  < > = values in this interval not displayed. ------------------------------------------------------------------------------------------------------------------  Cardiac Enzymes No results for input(s): TROPONINI in the last 168 hours. ------------------------------------------------------------------------------------------------------------------  RADIOLOGY:  No results found.  EKG:   Orders placed or performed during the hospital  encounter of 06/26/16  . EKG 12-Lead  . EKG 12-Lead  . ED EKG  . ED EKG    IMPRESSION AND PLAN:  Principal Problem:   Seizures (HCC) - patient states no prior history of seizures, Ativan in the ED he broke his seizure, we will keep this on board also as part of his CIWA protocol as below to help prevent anymore. CT head was within normal limits, neurology consult and EEG tomorrow Active Problems:   Alcohol withdrawal (HCC) - CIWA protocol with additional when necessary Ativan to help prevent seizures as above  All the records are reviewed and case discussed with ED provider. Management plans discussed with the patient and/or family.  DVT PROPHYLAXIS: SubQ lovenox  GI PROPHYLAXIS: None  ADMISSION STATUS: Inpatient  CODE STATUS: Full Code Status History    Date Active Date Inactive  Code Status Order ID Comments User Context   06/21/2016  3:11 PM 06/25/2016  7:29 PM Full Code [id number]  Enedina Finner, MD Inpatient      TOTAL TIME TAKING CARE OF THIS PATIENT: 45 minutes.    Mcanany, Brittne Kawasaki FIELDING 06/26/2016, 8:35 PM  [facility]  Office  [phone]  CC: Primary care physician; No PCP Per Patient

## 2016-06-26 NOTE — ED Notes (Signed)
Patient's clothing and linens were changed. Patient cleaned with wipes. TV put on per patient's request. Patient responds with one-word answers at this time. Patient moves all extremities equally.

## 2016-06-26 NOTE — ED Notes (Signed)
Patient placed on 2 L O2 via [place]

## 2016-06-27 ENCOUNTER — Inpatient Hospital Stay: Payer: Self-pay

## 2016-06-27 ENCOUNTER — Inpatient Hospital Stay (HOSPITAL_COMMUNITY): Payer: Self-pay

## 2016-06-27 DIAGNOSIS — R569 Unspecified convulsions: Secondary | ICD-10-CM

## 2016-06-27 LAB — CBC
HEMATOCRIT: 34 % — AB (ref 40.0–52.0)
HEMOGLOBIN: 11.7 g/dL — AB (ref 13.0–18.0)
MCH: 34.6 pg — ABNORMAL HIGH (ref 26.0–34.0)
MCHC: 34.3 g/dL (ref 32.0–36.0)
MCV: 100.9 fL — AB (ref 80.0–100.0)
Platelets: 157 10*3/uL (ref 150–440)
RBC: 3.37 MIL/uL — ABNORMAL LOW (ref 4.40–5.90)
RDW: 14.3 % (ref 11.5–14.5)
WBC: 5.5 10*3/uL (ref 3.8–10.6)

## 2016-06-27 LAB — BASIC METABOLIC PANEL
ANION GAP: 5 (ref 5–15)
BUN: 7 mg/dL (ref 6–20)
CHLORIDE: 109 mmol/L (ref 101–111)
CO2: 21 mmol/L — ABNORMAL LOW (ref 22–32)
Calcium: 8.6 mg/dL — ABNORMAL LOW (ref 8.9–10.3)
Creatinine, Ser: 0.61 mg/dL (ref 0.61–1.24)
GFR calc Af Amer: >60 mL/min (ref >60)
GFR calc non Af Amer: >60 mL/min (ref >60)
GLUCOSE: 91 mg/dL (ref 65–99)
Potassium: 3.7 mmol/L (ref 3.5–5.1)
Sodium: 135 mmol/L (ref 135–145)

## 2016-06-27 LAB — MAGNESIUM: Magnesium: 1.8 mg/dL (ref 1.7–2.4)

## 2016-06-27 LAB — PHOSPHORUS: PHOSPHORUS: 3.3 mg/dL (ref 2.5–4.6)

## 2016-06-27 MED ORDER — LORAZEPAM 2 MG/ML IJ SOLN: 1.0000 mg | Freq: Once | INTRAMUSCULAR | Status: DC

## 2016-06-27 MED ORDER — GADOBENATE DIMEGLUMINE 529 MG/ML IV SOLN
15.0000 mL | Freq: Once | INTRAVENOUS | Status: AC | PRN
  Administered 2016-06-27: 15:00:00 12 mL via INTRAVENOUS

## 2016-06-27 NOTE — Consult Note (Signed)
Reason for Consult:Seizures Referring Physician: Cherlynn Kaiser  CC: Seizures  HPI: Curtis [name] is an 61 y.o. male with a history of alcohol abuse who was recently admitted on 3/8 after a fall with alcohol withdrawal and unilateral inguinal hernia.  Admitted on 3/8 with a ETOH level of 126.  Reports returning home and only drinking a couple of beers. On yesterday noted by daughter to have what was described as a tonic-clonic seizure.  Patient was brought to the ED where he was noted to have two further seizures.  ETOH level of 5.    Past Medical History:  Diagnosis Date  . EtOH dependence [facility])     Past Surgical History:  Procedure Laterality Date  . NO PAST SURGERIES      Family History  Problem Relation Age of Onset  . Family history unknown: Yes    Social History:  reports that he has been smoking Cigarettes.  He has been smoking about 1.00 pack per day. He has never used smokeless tobacco. He reports that he drinks about 75.6 oz of alcohol per week . He reports that he does not use drugs.  No Known Allergies  Medications:  I have reviewed the patient's current medications. Prior to Admission:  Prescriptions Prior to Admission  Medication Sig Dispense Refill Last Dose  . folic acid (FOLVITE) 1 MG tablet Take 1 tablet (1 mg total) by mouth daily. 30 tablet 0   . Multiple Vitamin (MULTIVITAMIN WITH MINERALS) TABS tablet Take 1 tablet by mouth daily. 30 tablet 0   . thiamine 100 MG tablet Take 1 tablet (100 mg total) by mouth daily. 30 tablet 0    Scheduled: . enoxaparin (LOVENOX) injection  40 mg Subcutaneous Q24H  . folic acid  1 mg Oral Daily  . LORazepam  0-4 mg Intravenous Q6H   Followed by  . [START ON 06/28/2016] LORazepam  0-4 mg Intravenous Q12H  . multivitamin with minerals  1 tablet Oral Daily  . sodium chloride flush  3 mL Intravenous Q12H  . thiamine  100 mg Oral Daily   Or  . thiamine  100 mg Intravenous Daily    ROS: History obtained from the  patient  General ROS: negative for - chills, fatigue, fever, night sweats, weight gain or weight loss Psychological ROS: negative for - behavioral disorder, hallucinations, memory difficulties, mood swings or suicidal ideation Ophthalmic ROS: negative for - blurry vision, double vision, eye pain or loss of vision ENT ROS: negative for - epistaxis, nasal discharge, oral lesions, sore throat, tinnitus or vertigo Allergy and Immunology ROS: negative for - hives or itchy/watery eyes Hematological and Lymphatic ROS: negative for - bleeding problems, bruising or swollen lymph nodes Endocrine ROS: negative for - galactorrhea, hair pattern changes, polydipsia/polyuria or temperature intolerance Respiratory ROS: negative for - cough, hemoptysis, shortness of breath or wheezing Cardiovascular ROS: negative for - chest pain, dyspnea on exertion, edema or irregular heartbeat Gastrointestinal ROS: negative for - abdominal pain, diarrhea, hematemesis, nausea/vomiting or stool incontinence Genito-Urinary ROS: negative for - dysuria, hematuria, incontinence or urinary frequency/urgency Musculoskeletal ROS: negative for - joint swelling or muscular weakness Neurological ROS: as noted in HPI Dermatological ROS: negative for rash and skin lesion changes  Physical Examination: Blood pressure (!) 128/57, pulse 74, temperature 98.8 F (37.1 C), resp. rate 20, height 5\' 7"  (1.702 m), weight 63.4 kg (139 lb 12.8 oz), SpO2 100 %.  HEENT-  Normocephalic, no lesions, without obvious abnormality.  Normal external eye and conjunctiva.  Normal TM's  bilaterally.  Normal auditory canals and external ears. Normal external nose, mucus membranes and septum.  Normal pharynx. Cardiovascular- S1, S2 normal, pulses palpable throughout   Lungs- chest clear, no wheezing, rales, normal symmetric air entry Abdomen- soft, non-tender; bowel sounds normal; no masses,  no organomegaly Extremities- no edema Lymph-no adenopathy  palpable Musculoskeletal-no joint tenderness, deformity or swelling Skin-warm and dry, no hyperpigmentation, vitiligo, or suspicious lesions  Neurological Examination   Mental Status: Alert, oriented, thought content appropriate.  Speech fluent without evidence of aphasia.  Able to follow 3 step commands without difficulty. Cranial Nerves: II: Discs flat bilaterally; Visual fields grossly normal, pupils equal, round, reactive to light and accommodation III,IV, VI: ptosis not present, extra-ocular motions intact bilaterally V,VII: smile symmetric, facial light touch sensation normal bilaterally VIII: hearing normal bilaterally IX,X: gag reflex present XI: bilateral shoulder shrug XII: midline tongue extension Motor: Right : Upper extremity   5/5    Left:     Upper extremity   5/5  Lower extremity   5/5     Lower extremity   5/5 Tone and bulk:normal tone throughout; no atrophy noted.  Tremulous Sensory: Pinprick and light touch intact throughout, bilaterally Deep Tendon Reflexes: 1+ and symmetric with absent AJ's bilaterally Plantars: Right: downgoing   Left: downgoing Cerebellar: Normal finger-to-nose and normal heel-to-shin testing bilaterally Gait: not tested due to safety concerns    Laboratory Studies:   Basic Metabolic Panel:  Recent Labs Lab 06/21/16 1053 06/23/16 0314 06/26/16 1906 06/27/16 0442  NA 135 133* 132* 135  K 4.0 3.5 3.9 3.7  CL 101 101 101 109  CO2 24 24 23  21*  GLUCOSE 96 111* 121* 91  BUN 6 7 8 7   CREATININE 0.52* 0.39* 0.47* 0.61  CALCIUM 8.6* 8.8* 9.3 8.6*  MG 1.8  --   --   --   PHOS 3.7  --   --   --     Liver Function Tests:  Recent Labs Lab 06/21/16 1053 06/23/16 0314 06/26/16 1906  AST 133* 92* 54*  ALT 86* 66* 63  ALKPHOS 116 98 105  BILITOT 0.5 1.2 0.6  PROT 7.1 6.0* 7.1  ALBUMIN 3.7 3.0* 3.5    Recent Labs Lab 06/21/16 1053  LIPASE 42    Recent Labs Lab 06/21/16 1402  AMMONIA 19    CBC:  Recent Labs Lab  06/21/16 1053 06/23/16 0314 06/26/16 1817 06/27/16 0442  WBC 4.8 4.3 5.1 5.5  HGB 13.3 12.4* 12.8* 11.7*  HCT 38.8* 35.8* 37.2* 34.0*  MCV 100.6* 98.4 101.0* 100.9*  PLT 104* 94* 138* 157    Cardiac Enzymes: No results for input(s): CKTOTAL, CKMB, CKMBINDEX, TROPONINI in the last 168 hours.  BNP: Invalid input(s): POCBNP  CBG:  Recent Labs Lab 06/26/16 1805  GLUCAP 112*    Microbiology: Results for orders placed or performed in visit on 08/23/13  Culture, blood (single)     Status: None   Collection Time: 08/23/13  6:56 PM  Result Value Ref Range Status   Micro Text Report   Final       COMMENT                   NO GROWTH AEROBICALLY/ANAEROBICALLY IN 5 DAYS   ANTIBIOTIC  Culture, blood (single)     Status: None   Collection Time: 08/23/13  6:56 PM  Result Value Ref Range Status   Micro Text Report   Final       COMMENT                   NO GROWTH AEROBICALLY/ANAEROBICALLY IN 5 DAYS   ANTIBIOTIC                                                        Coagulation Studies: No results for input(s): LABPROT, INR in the last 72 hours.  Urinalysis:  Recent Labs Lab 06/21/16 1053 06/26/16 2118  COLORURINE STRAW* YELLOW*  LABSPEC 1.005 1.020  PHURINE 6.0 5.0  GLUCOSEU NEGATIVE NEGATIVE  HGBUR NEGATIVE MODERATE*  BILIRUBINUR NEGATIVE NEGATIVE  KETONESUR NEGATIVE NEGATIVE  PROTEINUR NEGATIVE 100*  NITRITE NEGATIVE NEGATIVE  LEUKOCYTESUR NEGATIVE NEGATIVE    Lipid Panel:  No results found for: CHOL, TRIG, HDL, CHOLHDL, VLDL, LDLCALC  HgbA1C: No results found for: HGBA1C  Urine Drug Screen:     Component Value Date/Time   LABOPIA NONE DETECTED 06/26/2016 1752   COCAINSCRNUR NONE DETECTED 06/26/2016 1752   LABBENZ NONE DETECTED 06/26/2016 1752   AMPHETMU NONE DETECTED 06/26/2016 1752   THCU NONE DETECTED 06/26/2016 1752   LABBARB NONE DETECTED 06/26/2016 1752    Alcohol Level:  Recent Labs Lab  06/21/16 1402 06/26/16 1814  ETH 126* 5*    Other results: EKG: sinus tachycardia.at 102 bpm  Imaging: Ct Head Wo Contrast  Result Date: 06/26/2016 CLINICAL DATA:  61 year old male with seizures EXAM: CT HEAD WITHOUT CONTRAST TECHNIQUE: Contiguous axial images were obtained from the base of the skull through the vertex without intravenous contrast. COMPARISON:  Head CT dated 06/21/2016 FINDINGS: Evaluation of this exam is limited due to motion artifact. Brain: The ventricles and sulci appropriate size for patient's age. Age mild periventricular and deep white matter chronic microvascular ischemic changes noted. There is no acute intracranial hemorrhage. No mass effect or midline shift noted. Vascular: No hyperdense vessel or unexpected calcification. Skull: Normal. Negative for fracture or focal lesion. Sinuses/Orbits: There is partial opacification of the right maxillary sinus. Old left lamina papyracea fracture. The mastoid air cells are clear. Other: None IMPRESSION: No acute intracranial pathology. Electronically Signed   By: Elgie CollardArash  Radparvar M.D.   On: 06/26/2016 21:04     Assessment/Plan: 61 year old male with a history of ETOH abuse presenting with seizures.  Patient with a 30+ history of drinking daily per his report.  Admission ETOH level of 5.  Suspect ETOH withdrawal seizures.  Neurological examination is nonfocal.  Head CT reviewed and shows no acute changes.    Recommendations: 1.  EEG 2.  MRI of the brain 3.  If above unremarkable AED therapy not indicated at this time.  4.  Seizure precautions 5.  Ativan prn seizure activity 6.  Agree with CIWA 7.  Patient unable to drive, operate heavy machinery, perform activities at heights and participate in water activities until release by outpatient physician.  Thana FarrLeslie Kylee Umana, MD Neurology [phone] 06/27/2016, 11:31 AM

## 2016-06-27 NOTE — Progress Notes (Signed)
[facility] [place] [facility]   PATIENT NAME: Curtis [name]    MR#:  [id number]  DATE OF BIRTH:  1955-05-07  SUBJECTIVE:   Patient is here due to seizures. Patient was noted to have 2 witnessed tonic-clonic seizures yesterday. Overnight has had no seizures. No other complaints or events overnight.   REVIEW OF SYSTEMS:    Review of Systems  Constitutional: Negative for chills and fever.  HENT: Negative for congestion and tinnitus.   Eyes: Negative for blurred vision and double vision.  Respiratory: Negative for cough, shortness of breath and wheezing.   Cardiovascular: Negative for chest pain, orthopnea and PND.  Gastrointestinal: Negative for abdominal pain, diarrhea, nausea and vomiting.  Genitourinary: Negative for dysuria and hematuria.  Neurological: Negative for dizziness, sensory change and focal weakness.  All other systems reviewed and are negative.   Nutrition: Regular Tolerating Diet: Yes Tolerating PT: Await Eval.    DRUG ALLERGIES:  No Known Allergies  VITALS:  Blood pressure (!) 119/54, pulse 92, temperature 99.2 F (37.3 C), temperature source Oral, resp. rate 18, height 5\' 7"  (1.702 m), weight 63.4 kg (139 lb 12.8 oz), SpO2 100 %.  PHYSICAL EXAMINATION:   Physical Exam  GENERAL:  61 y.o.-year-old patient lying in bed in no acute distress.  EYES: Pupils equal, round, reactive to light and accommodation. No scleral icterus. Extraocular muscles intact.  HEENT: Head atraumatic, normocephalic. Oropharynx and nasopharynx clear.  NECK:  Supple, no jugular venous distention. No thyroid enlargement, no tenderness.  LUNGS: Normal breath sounds bilaterally, no wheezing, rales, rhonchi. No use of accessory muscles of respiration.  CARDIOVASCULAR: S1, S2 normal. No murmurs, rubs, or gallops.  ABDOMEN: Soft, nontender, nondistended. Bowel sounds present. No organomegaly or mass.  EXTREMITIES: No cyanosis, clubbing or edema b/l.    NEUROLOGIC:  Cranial nerves II through XII are intact. No focal Motor or sensory deficits b/l.   PSYCHIATRIC: The patient is alert and oriented x 1.  SKIN: No obvious rash, lesion, or ulcer.    LABORATORY PANEL:   CBC  Recent Labs Lab 06/27/16 0442  WBC 5.5  HGB 11.7*  HCT 34.0*  PLT 157   ------------------------------------------------------------------------------------------------------------------  Chemistries   Recent Labs Lab 06/21/16 1053  06/26/16 1906 06/27/16 0442  NA 135  < > 132* 135  K 4.0  < > 3.9 3.7  CL 101  < > 101 109  CO2 24  < > 23 21*  GLUCOSE 96  < > 121* 91  BUN 6  < > 8 7  CREATININE 0.52*  < > 0.47* 0.61  CALCIUM 8.6*  < > 9.3 8.6*  MG 1.8  --   --   --   AST 133*  < > 54*  --   ALT 86*  < > 63  --   ALKPHOS 116  < > 105  --   BILITOT 0.5  < > 0.6  --   < > = values in this interval not displayed. ------------------------------------------------------------------------------------------------------------------  Cardiac Enzymes No results for input(s): TROPONINI in the last 168 hours. ------------------------------------------------------------------------------------------------------------------  RADIOLOGY:  Ct Head Wo Contrast  Result Date: 06/26/2016 CLINICAL DATA:  61 year old male with seizures EXAM: CT HEAD WITHOUT CONTRAST TECHNIQUE: Contiguous axial images were obtained from the base of the skull through the vertex without intravenous contrast. COMPARISON:  Head CT dated 06/21/2016 FINDINGS: Evaluation of this exam is limited due to motion artifact. Brain: The ventricles and sulci appropriate size for patient's age. Age mild periventricular  and deep white matter chronic microvascular ischemic changes noted. There is no acute intracranial hemorrhage. No mass effect or midline shift noted. Vascular: No hyperdense vessel or unexpected calcification. Skull: Normal. Negative for fracture or focal lesion. Sinuses/Orbits: There is partial opacification  of the right maxillary sinus. Old left lamina papyracea fracture. The mastoid air cells are clear. Other: None IMPRESSION: No acute intracranial pathology. Electronically Signed   By: Elgie CollardArash  Radparvar M.D.   On: 06/26/2016 21:04     ASSESSMENT AND PLAN:   61 year old male with past medical history of alcohol abuse who was just discharged from the hospital after alcohol withdrawal returns to the hospital due to recurrent seizures.  1. Seizures-suspected to be secondary to alcohol withdrawal. -Continue CIWA protocol, continue Ativan. CT head negative, appreciate neurology input and plan for MRI of the brain, EEG today. -If workup is negative patient likely will not need antiepileptic therapy. -Continue seizure precautions.  2. Alcohol abuse-continue CIWA protocol. - cont. Thiamine, Folate.     All the records are reviewed and case discussed with Care Management/Social Worker. Management plans discussed with the patient, family and they are in agreement.  CODE STATUS: Full code  DVT Prophylaxis: Lovenox  TOTAL TIME TAKING CARE OF THIS PATIENT: 30 minutes.   POSSIBLE D/C IN 1-2 DAYS, DEPENDING ON CLINICAL CONDITION.   Houston SirenSAINANI,Jaiyden Laur J M.D on 06/27/2016 at 1:14 PM  Between 7am to 6pm - Pager - [phone]  After 6pm go to [email or web address] - Scientist, research (life sciences)password EPAS [facility]  [facility]  Office  [phone]  CC: Primary care physician; No PCP Per Patient

## 2016-06-28 MED ORDER — CHLORDIAZEPOXIDE HCL 5 MG PO CAPS: 5.0000 mg | ORAL_CAPSULE | Freq: Three times a day (TID) | ORAL | 0 refills | Status: DC

## 2016-06-28 NOTE — Clinical Social Work Note (Signed)
CSW was consulted for current substance abuse. Pt is ready for discharge today and will return home. Per RN, pt's family will provide transporation. CSW met with pt who endorses ETOH use and plans to stop drinking. CSW provided resources for substance abuse. CSW is signing off as no further needs identified.   Curtis [name], MSW, LCSW  Clinical Social Worker  [phone]

## 2016-06-28 NOTE — Plan of Care (Signed)
Problem: Physical Regulation: Goal: Ability to maintain clinical measurements within normal limits will improve Outcome: Progressing CIWA 0

## 2016-06-28 NOTE — Discharge Instructions (Addendum)
Seizures may happen at any time. It is important to take certain precautions to maintain your safety.   Follow up with your doctor in 1 week  During a seizure, a person may injure himself or herself. Seizure precautions are guidelines that a person can follow in order to minimize injury during a seizure. For any activity, it is important to ask, "What would happen if I had a seizure while doing this?" Follow the below precautions.  Bathroom Safety  A person with seizures may want to shower instead of bathe to avoid accidental drowning. If falls occur during the patient's typical seizure, a person should use a shower seat, preferably one with a safety strap.  Use nonskid strips in your shower or tub.  Never use electrical equipment near water. This prevents accidental electrocution.  Consider changing glass in shower doors to shatterproof glass.  Secondary school teacherKitchen Safety If possible, cook when someone else is nearby.  Use the back burners of the stove to prevent accidental burns.  Use shatterproof containers as much as possible. For instance, sauces can be transferred from glass bottles to plastic containers for use.  Limit time that is required using knives or other sharp objects. If possible, buy foods that are already cut, or ask someone to help in meal preparation.   General Safety at Home Do not smoke or light fires in the fireplace unless someone else is present.  Do not use space heaters that can be accidentally overturned.  When alone, avoid using step stools or ladders, and do not clean rooftop gutters.  Purchase power tools and motorized Risk managerlawn equipment which have a safety switch that will stop the machine if you release the handle (a 'dead man's' switch).   Driving and Transportation Avoid driving unless your seizures are well controlled and/or you have permission to drive from your state's Department of Motor Vehicles  [facility]([employer]). Each state has different laws. Please refer to the following link  on the Epilepsy Foundation of America's website for more information: http://www.epilepsyfoundation.org/answerplace/Social/driving/drivingu.cfm  If you ride a bicycle, wear a helmet and any other necessary protective gear.  When taking public transportation like the bus or subway, stay clear of the platform edge.   Outdoor Theatre managerand Sports Safety Swimming is okay, but does present certain risks. Never swim alone, and tell friends what to do if you have a seizure while swimming.  Wear appropriate protective equipment.  Ski with a friend. If a seizure occurs, your friend can seek help, if needed. He or she can also help to get you out of the cold. Consider using a safety hook or belt while riding the ski lift.   No driving for 6 months.

## 2016-06-28 NOTE — Progress Notes (Signed)
Subjective: Patient awake and alert.  No further seizures noted.    Objective: Current vital signs: BP 132/67 (BP Location: Left Arm)   Pulse 79   Temp 98 F (36.7 C) (Oral)   Resp 18   Ht 5\' 7"  (1.702 m)   Wt 63.4 kg (139 lb 12.8 oz)   SpO2 96%   BMI 21.90 kg/m  Vital signs in last 24 hours: Temp:  [98 F (36.7 C)-99.2 F (37.3 C)] 98 F (36.7 C) (03/15 0434) Pulse Rate:  [79-92] 79 (03/15 0434) Resp:  [17-19] 18 (03/15 0434) BP: (118-135)/(52-67) 132/67 (03/15 0434) SpO2:  [96 %-100 %] 96 % (03/15 0434)  Intake/Output from previous day: 03/14 0701 - 03/15 0700 In: 480 [P.O.:480] Out: 850 [Urine:850] Intake/Output this shift: Total I/O In: 240 [P.O.:240] Out: 350 [Urine:350] Nutritional status: Diet regular Room service appropriate? Yes; Fluid consistency: Thin  Neurologic Exam: Mental Status: Alert, oriented, thought content appropriate.  Speech fluent without evidence of aphasia.  Able to follow 3 step commands without difficulty. Cranial Nerves: II: Discs flat bilaterally; Visual fields grossly normal, pupils equal, round, reactive to light and accommodation III,IV, VI: ptosis not present, extra-ocular motions intact bilaterally V,VII: smile symmetric, facial light touch sensation normal bilaterally VIII: hearing normal bilaterally IX,X: gag reflex present XI: bilateral shoulder shrug XII: midline tongue extension Motor: Right :  Upper extremity   5/5                                      Left:     Upper extremity   5/5             Lower extremity   5/5                                                  Lower extremity   5/5 Tone and bulk:normal tone throughout; no atrophy noted.  Tremulous but improved  Lab Results: Basic Metabolic Panel:  Recent Labs Lab 06/21/16 1053 06/23/16 0314 06/26/16 1906 06/27/16 0442 06/27/16 1309  NA 135 133* 132* 135  --   K 4.0 3.5 3.9 3.7  --   CL 101 101 101 109  --   CO2 24 24 23  21*  --   GLUCOSE 96 111* 121* 91  --    BUN 6 7 8 7   --   CREATININE 0.52* 0.39* 0.47* 0.61  --   CALCIUM 8.6* 8.8* 9.3 8.6*  --   MG 1.8  --   --   --  1.8  PHOS 3.7  --   --   --  3.3    Liver Function Tests:  Recent Labs Lab 06/21/16 1053 06/23/16 0314 06/26/16 1906  AST 133* 92* 54*  ALT 86* 66* 63  ALKPHOS 116 98 105  BILITOT 0.5 1.2 0.6  PROT 7.1 6.0* 7.1  ALBUMIN 3.7 3.0* 3.5    Recent Labs Lab 06/21/16 1053  LIPASE 42    Recent Labs Lab 06/21/16 1402  AMMONIA 19    CBC:  Recent Labs Lab 06/21/16 1053 06/23/16 0314 06/26/16 1817 06/27/16 0442  WBC 4.8 4.3 5.1 5.5  HGB 13.3 12.4* 12.8* 11.7*  HCT 38.8* 35.8* 37.2* 34.0*  MCV 100.6* 98.4 101.0* 100.9*  PLT 104* 94* 138*  157    Cardiac Enzymes: No results for input(s): CKTOTAL, CKMB, CKMBINDEX, TROPONINI in the last 168 hours.  Lipid Panel: No results for input(s): CHOL, TRIG, HDL, CHOLHDL, VLDL, LDLCALC in the last 168 hours.  CBG:  Recent Labs Lab 06/26/16 1805  GLUCAP 112*    Microbiology: Results for orders placed or performed in visit on 08/23/13  Culture, blood (single)     Status: None   Collection Time: 08/23/13  6:56 PM  Result Value Ref Range Status   Micro Text Report   Final       COMMENT                   NO GROWTH AEROBICALLY/ANAEROBICALLY IN 5 DAYS   ANTIBIOTIC                                                      Culture, blood (single)     Status: None   Collection Time: 08/23/13  6:56 PM  Result Value Ref Range Status   Micro Text Report   Final       COMMENT                   NO GROWTH AEROBICALLY/ANAEROBICALLY IN 5 DAYS   ANTIBIOTIC                                                        Coagulation Studies: No results for input(s): LABPROT, INR in the last 72 hours.  Imaging: Ct Head Wo Contrast  Result Date: 06/26/2016 CLINICAL DATA:  61 year old male with seizures EXAM: CT HEAD WITHOUT CONTRAST TECHNIQUE: Contiguous axial images were obtained from the base of the skull through the vertex  without intravenous contrast. COMPARISON:  Head CT dated 06/21/2016 FINDINGS: Evaluation of this exam is limited due to motion artifact. Brain: The ventricles and sulci appropriate size for patient's age. Age mild periventricular and deep white matter chronic microvascular ischemic changes noted. There is no acute intracranial hemorrhage. No mass effect or midline shift noted. Vascular: No hyperdense vessel or unexpected calcification. Skull: Normal. Negative for fracture or focal lesion. Sinuses/Orbits: There is partial opacification of the right maxillary sinus. Old left lamina papyracea fracture. The mastoid air cells are clear. Other: None IMPRESSION: No acute intracranial pathology. Electronically Signed   By: Elgie CollardArash  Radparvar M.D.   On: 06/26/2016 21:04   Mr Laqueta JeanBrain W WUWo Contrast  Result Date: 06/27/2016 CLINICAL DATA:  Seizure.  Alcohol withdrawal. EXAM: MRI HEAD WITHOUT AND WITH CONTRAST TECHNIQUE: Multiplanar, multiecho pulse sequences of the brain and surrounding structures were obtained without and with intravenous contrast. CONTRAST:  12mL MULTIHANCE GADOBENATE DIMEGLUMINE 529 MG/ML IV SOLN COMPARISON:  Head CT from yesterday FINDINGS: Brain: No acute infarction, hemorrhage, hydrocephalus, extra-axial collection or mass lesion. No cortical finding to explain seizure. No evidence of osmotic demyelination. Mamillary bodies were not well covered on fluid sensitive sequences. No abnormal intracranial enhancement. Vascular: No acute finding. Strongly dominant left transverse sigmoid system. Skull and upper cervical spine: No marrow lesion noted. Prominent spondylosis. Sinuses/Orbits: Remote blowout fracture of the medial wall left orbit. Moderate mucosal edema in the paranasal sinuses, especially the  maxillary antra. IMPRESSION: 1. No acute finding or explanation for seizure. 2. Moderate chronic sinusitis. Electronically Signed   By: Marnee Spring M.D.   On: 06/27/2016 15:15    Medications:  I have  reviewed the patient's current medications. Scheduled: . enoxaparin (LOVENOX) injection  40 mg Subcutaneous Q24H  . folic acid  1 mg Oral Daily  . LORazepam  0-4 mg Intravenous Q6H   Followed by  . LORazepam  0-4 mg Intravenous Q12H  . LORazepam  1 mg Intravenous Once  . multivitamin with minerals  1 tablet Oral Daily  . sodium chloride flush  3 mL Intravenous Q12H  . thiamine  100 mg Oral Daily   Or  . thiamine  100 mg Intravenous Daily    Assessment/Plan: No further seizures noted.  MRI of the brain reviewed and shows no acute changes.  EEG unremarkable.    Recommendations: 1.  Seizures likely provoked.  AED therapy not indicated at this time.   2.  Patient unable to drive, operate heavy machinery, perform activities at heights and participate in water activities until release by outpatient physician.   LOS: 2 days   Thana Farr, MD Neurology [phone] 06/28/2016  11:21 AM

## 2016-06-28 NOTE — Care Management (Signed)
[facility]. Application given [facility] and [facility]. Referrals sent to both agencies. Patient needs to follow up.

## 2016-06-28 NOTE — Discharge Summary (Signed)
[facility] [place] [facility]   PATIENT NAME: Curtis [name]    MR#:  [id number]  DATE OF BIRTH:  [birth year]  DATE OF ADMISSION:  06/26/2016 ADMITTING PHYSICIAN: Oralia Manis, MD  DATE OF DISCHARGE: 06/28/2016 12:21 PM  PRIMARY CARE PHYSICIAN: No PCP Per Patient   ADMISSION DIAGNOSIS:  Seizure (HCC) [R56.9] Seizures (HCC) [R56.9] Alcohol dependence with intoxication with complication (HCC) [F10.229]  DISCHARGE DIAGNOSIS:  Principal Problem:   Seizures (HCC) Active Problems:   Alcohol withdrawal (HCC)   SECONDARY DIAGNOSIS:   Past Medical History:  Diagnosis Date  . EtOH dependence (HCC)      ADMITTING HISTORY  HISTORY OF PRESENT ILLNESS:  Curtis [name]  is a 61 y.o. male who presents with Seizures. Patient was discharged from our facility after several days stay for alcohol withdrawal. At home one day after discharge he had a seizure episode, described by his daughter as whole body jerking motions. Patient states he does not recall the episode. He states that he had a couple of beers since he got home, but was just watching TV in the afternoon when he had the seizure episode. Here in the ED his alcohol level was 5, and his other labs were largely within normal limits. He had 2 more seizure episodes here, and hospitalists were called for admission and further evaluation.   HOSPITAL COURSE:   * Alcohol abuse and withdrawal seizures Patient was admitted onto medical floor with telemetry monitoring. Started on benzodiazepines. He did need some as needed Ativan in the hospital. Seen by neurology. No antiepileptic started. He had an MRI of the brain which showed nothing acute. EEG showed some generalized slowing but no epileptiform waves. No further seizures in the hospital. After discussing with Dr. Thad Ranger of neurology patient is being discharged home with Librium for alcohol withdrawals. No antiepileptics. I have advised him not to drive for the next 6  months.  Stable for discharge home.  CONSULTS OBTAINED:  Treatment Team:  Thana Farr, MD  DRUG ALLERGIES:  No Known Allergies  DISCHARGE MEDICATIONS:   Discharge Medication List as of 06/28/2016 11:07 AM    CONTINUE these medications which have CHANGED   Details  chlordiazePOXIDE (LIBRIUM) 5 MG capsule Take 1 capsule (5 mg total) by mouth 3 (three) times daily., Starting Thu 06/28/2016, Print      CONTINUE these medications which have NOT CHANGED   Details  folic acid (FOLVITE) 1 MG tablet Take 1 tablet (1 mg total) by mouth daily., Starting Mon 06/25/2016, Print    Multiple Vitamin (MULTIVITAMIN WITH MINERALS) TABS tablet Take 1 tablet by mouth daily., Starting Mon 06/25/2016, Print    thiamine 100 MG tablet Take 1 tablet (100 mg total) by mouth daily., Starting Mon 06/25/2016, Print        Today   VITAL SIGNS:  Blood pressure 132/67, pulse 79, temperature 98 F (36.7 C), temperature source Oral, resp. rate 18, height 5\' 7"  (1.702 m), weight 63.4 kg (139 lb 12.8 oz), SpO2 96 %.  I/O:   Intake/Output Summary (Last 24 hours) at 06/28/16 1321 Last data filed at 06/28/16 0800  Gross per 24 hour  Intake              720 ml  Output             1000 ml  Net             -280 ml    PHYSICAL EXAMINATION:  Physical Exam  GENERAL:  61 y.o.-year-old patient lying in the bed with no acute distress.  LUNGS: Normal breath sounds bilaterally, no wheezing, rales,rhonchi or crepitation. No use of accessory muscles of respiration.  CARDIOVASCULAR: S1, S2 normal. No murmurs, rubs, or gallops.  ABDOMEN: Soft, non-tender, non-distended. Bowel sounds present. No organomegaly or mass.  NEUROLOGIC: Moves all 4 extremities. PSYCHIATRIC: The patient is alert and oriented x 3.  SKIN: No obvious rash, lesion, or ulcer.   DATA REVIEW:   CBC  Recent Labs Lab 06/27/16 0442  WBC 5.5  HGB 11.7*  HCT 34.0*  PLT 157    Chemistries   Recent Labs Lab 06/26/16 1906 06/27/16 0442  06/27/16 1309  NA 132* 135  --   K 3.9 3.7  --   CL 101 109  --   CO2 23 21*  --   GLUCOSE 121* 91  --   BUN 8 7  --   CREATININE 0.47* 0.61  --   CALCIUM 9.3 8.6*  --   MG  --   --  1.8  AST 54*  --   --   ALT 63  --   --   ALKPHOS 105  --   --   BILITOT 0.6  --   --     Cardiac Enzymes No results for input(s): TROPONINI in the last 168 hours.  Microbiology Results  Results for orders placed or performed in visit on 08/23/13  Culture, blood (single)     Status: None   Collection Time: 08/23/13  6:56 PM  Result Value Ref Range Status   Micro Text Report   Final       COMMENT                   NO GROWTH AEROBICALLY/ANAEROBICALLY IN 5 DAYS   ANTIBIOTIC                                                      Culture, blood (single)     Status: None   Collection Time: 08/23/13  6:56 PM  Result Value Ref Range Status   Micro Text Report   Final       COMMENT                   NO GROWTH AEROBICALLY/ANAEROBICALLY IN 5 DAYS   ANTIBIOTIC                                                        RADIOLOGY:  Ct Head Wo Contrast  Result Date: 06/26/2016 CLINICAL DATA:  61 year old male with seizures EXAM: CT HEAD WITHOUT CONTRAST TECHNIQUE: Contiguous axial images were obtained from the base of the skull through the vertex without intravenous contrast. COMPARISON:  Head CT dated 06/21/2016 FINDINGS: Evaluation of this exam is limited due to motion artifact. Brain: The ventricles and sulci appropriate size for patient's age. Age mild periventricular and deep white matter chronic microvascular ischemic changes noted. There is no acute intracranial hemorrhage. No mass effect or midline shift noted. Vascular: No hyperdense vessel or unexpected calcification. Skull: Normal. Negative for fracture or focal lesion. Sinuses/Orbits: There is partial opacification of the right maxillary  sinus. Old left lamina papyracea fracture. The mastoid air cells are clear. Other: None IMPRESSION: No acute  intracranial pathology. Electronically Signed   By: Elgie Collard M.D.   On: 06/26/2016 21:04   Mr Laqueta Jean ZO Contrast  Result Date: 06/27/2016 CLINICAL DATA:  Seizure.  Alcohol withdrawal. EXAM: MRI HEAD WITHOUT AND WITH CONTRAST TECHNIQUE: Multiplanar, multiecho pulse sequences of the brain and surrounding structures were obtained without and with intravenous contrast. CONTRAST:  12mL MULTIHANCE GADOBENATE DIMEGLUMINE 529 MG/ML IV SOLN COMPARISON:  Head CT from yesterday FINDINGS: Brain: No acute infarction, hemorrhage, hydrocephalus, extra-axial collection or mass lesion. No cortical finding to explain seizure. No evidence of osmotic demyelination. Mamillary bodies were not well covered on fluid sensitive sequences. No abnormal intracranial enhancement. Vascular: No acute finding. Strongly dominant left transverse sigmoid system. Skull and upper cervical spine: No marrow lesion noted. Prominent spondylosis. Sinuses/Orbits: Remote blowout fracture of the medial wall left orbit. Moderate mucosal edema in the paranasal sinuses, especially the maxillary antra. IMPRESSION: 1. No acute finding or explanation for seizure. 2. Moderate chronic sinusitis. Electronically Signed   By: Marnee Spring M.D.   On: 06/27/2016 15:15    Follow up with PCP in 1 week.  Management plans discussed with the patient, family and they are in agreement.  CODE STATUS:     Code Status Orders        Start     Ordered   06/26/16 2324  Full code  Continuous     06/26/16 2324    Code Status History    Date Active Date Inactive Code Status Order ID Comments User Context   06/21/2016  3:11 PM 06/25/2016  7:29 PM Full Code [id number]  Enedina Finner, MD Inpatient      TOTAL TIME TAKING CARE OF THIS PATIENT ON DAY OF DISCHARGE: more than 30 minutes.   Milagros Loll R M.D on 06/28/2016 at 1:21 PM  Between 7am to 6pm - Pager - [phone]  After 6pm go to [email or web address] - password [email or web address]  [facility] [place] [facility]   Office  [phone]  CC: Primary care physician; No PCP Per Patient  Note: This dictation was prepared with Dragon dictation along with smaller phrase technology. Any transcriptional errors that result from this process are unintentional.

## 2016-06-28 NOTE — Progress Notes (Signed)
Discharge instructions along with home medications and follow up gone over with patient and family members. Both verbalize that they understood instructions. One prescription given to patient. IV's removed. Pt being discharged home on room air, no distress noted. Otilio JeffersonMadelyn S Fenton, RN

## 2016-11-01 ENCOUNTER — Inpatient Hospital Stay
Admission: EM | Admit: 2016-11-01 | Discharge: 2016-11-03 | DRG: 351 | Disposition: A | Discharge status: Home / Self Care | Payer: Self-pay | Attending: Surgery | Admitting: Surgery

## 2016-11-01 ENCOUNTER — Emergency Department: Payer: Self-pay | Admitting: Anesthesiology

## 2016-11-01 ENCOUNTER — Encounter
Admission: EM | Disposition: A | Discharge status: Home / Self Care | Payer: Self-pay | Source: Home / Self Care | Attending: Surgery

## 2016-11-01 ENCOUNTER — Emergency Department: Payer: Self-pay

## 2016-11-01 DIAGNOSIS — F17210 Nicotine dependence, cigarettes, uncomplicated: Secondary | ICD-10-CM | POA: Diagnosis present

## 2016-11-01 DIAGNOSIS — E440 Moderate protein-calorie malnutrition: Secondary | ICD-10-CM | POA: Insufficient documentation

## 2016-11-01 DIAGNOSIS — R188 Other ascites: Secondary | ICD-10-CM | POA: Diagnosis present

## 2016-11-01 DIAGNOSIS — K56699 Other intestinal obstruction unspecified as to partial versus complete obstruction: Secondary | ICD-10-CM

## 2016-11-01 DIAGNOSIS — Z79899 Other long term (current) drug therapy: Secondary | ICD-10-CM

## 2016-11-01 DIAGNOSIS — F1020 Alcohol dependence, uncomplicated: Secondary | ICD-10-CM | POA: Diagnosis present

## 2016-11-01 DIAGNOSIS — K4030 Unilateral inguinal hernia, with obstruction, without gangrene, not specified as recurrent: Principal | ICD-10-CM

## 2016-11-01 DIAGNOSIS — K460 Unspecified abdominal hernia with obstruction, without gangrene: Secondary | ICD-10-CM | POA: Diagnosis present

## 2016-11-01 DIAGNOSIS — Z6825 Body mass index (BMI) 25.0-25.9, adult: Secondary | ICD-10-CM

## 2016-11-01 DIAGNOSIS — K56609 Unspecified intestinal obstruction, unspecified as to partial versus complete obstruction: Secondary | ICD-10-CM | POA: Insufficient documentation

## 2016-11-01 HISTORY — PX: INGUINAL HERNIA REPAIR: SHX194

## 2016-11-01 LAB — CBC
HEMATOCRIT: 40.1 % (ref 40.0–52.0)
HEMOGLOBIN: 13.9 g/dL (ref 13.0–18.0)
MCH: 33.8 pg (ref 26.0–34.0)
MCHC: 34.8 g/dL (ref 32.0–36.0)
MCV: 97 fL (ref 80.0–100.0)
Platelets: 181 10*3/uL (ref 150–440)
RBC: 4.13 MIL/uL — AB (ref 4.40–5.90)
RDW: 14.5 % (ref 11.5–14.5)
WBC: 9.5 10*3/uL (ref 3.8–10.6)

## 2016-11-01 LAB — LIPASE, BLOOD: LIPASE: 49 U/L (ref 11–51)

## 2016-11-01 LAB — URINALYSIS, COMPLETE (UACMP) WITH MICROSCOPIC
BILIRUBIN URINE: NEGATIVE
Glucose, UA: 50 mg/dL — AB
Hgb urine dipstick: NEGATIVE
KETONES UR: 5 mg/dL — AB
Leukocytes, UA: NEGATIVE
Nitrite: NEGATIVE
PH: 5 (ref 5.0–8.0)
PROTEIN: 30 mg/dL — AB
Specific Gravity, Urine: 1.016 (ref 1.005–1.030)

## 2016-11-01 LAB — COMPREHENSIVE METABOLIC PANEL
ALBUMIN: 3.7 g/dL (ref 3.5–5.0)
ALT: 33 U/L (ref 17–63)
ANION GAP: 16 — AB (ref 5–15)
AST: 48 U/L — ABNORMAL HIGH (ref 15–41)
Alkaline Phosphatase: 100 U/L (ref 38–126)
BUN: 7 mg/dL (ref 6–20)
CHLORIDE: 93 mmol/L — AB (ref 101–111)
CO2: 22 mmol/L (ref 22–32)
Calcium: 8.6 mg/dL — ABNORMAL LOW (ref 8.9–10.3)
Creatinine, Ser: 0.56 mg/dL — ABNORMAL LOW (ref 0.61–1.24)
GFR calc non Af Amer: >60 mL/min (ref >60)
GLUCOSE: 187 mg/dL — AB (ref 65–99)
Potassium: 3.2 mmol/L — ABNORMAL LOW (ref 3.5–5.1)
SODIUM: 131 mmol/L — AB (ref 135–145)
Total Bilirubin: 0.7 mg/dL (ref 0.3–1.2)
Total Protein: 7.3 g/dL (ref 6.5–8.1)

## 2016-11-01 SURGERY — REPAIR, HERNIA, INGUINAL, INCARCERATED
Anesthesia: General | Site: Inguinal | Laterality: Right | Wound class: Clean Contaminated

## 2016-11-01 MED ORDER — SODIUM CHLORIDE 0.9 % IV BOLUS (SEPSIS)
1000.0000 mL | Freq: Once | INTRAVENOUS | Status: AC
  Administered 2016-11-01: 1000 mL via INTRAVENOUS

## 2016-11-01 MED ORDER — LORAZEPAM 2 MG PO TABS
0.0000 mg | ORAL_TABLET | Freq: Four times a day (QID) | ORAL | Status: DC
  Administered 2016-11-02: 1 mg via ORAL
  Filled 2016-11-01: qty 1

## 2016-11-01 MED ORDER — FENTANYL CITRATE (PF) 100 MCG/2ML IJ SOLN
INTRAMUSCULAR | Status: AC
  Filled 2016-11-01: qty 2

## 2016-11-01 MED ORDER — VITAMIN B-1 100 MG PO TABS
100.0000 mg | ORAL_TABLET | Freq: Every day | ORAL | Status: DC
  Administered 2016-11-02: 100 mg via ORAL
  Filled 2016-11-01 (×2): qty 1

## 2016-11-01 MED ORDER — LORAZEPAM 2 MG PO TABS
0.0000 mg | ORAL_TABLET | Freq: Two times a day (BID) | ORAL | Status: DC
Start: 2016-11-04 — End: 2016-11-03

## 2016-11-01 MED ORDER — LORAZEPAM 2 MG/ML IJ SOLN: 0.0000 mg | Freq: Four times a day (QID) | INTRAMUSCULAR | Status: DC

## 2016-11-01 MED ORDER — FENTANYL CITRATE (PF) 100 MCG/2ML IJ SOLN
INTRAMUSCULAR | Status: DC | PRN
  Administered 2016-11-01 – 2016-11-02 (×4): 50 ug via INTRAVENOUS

## 2016-11-01 MED ORDER — LORAZEPAM 2 MG/ML IJ SOLN: 0.0000 mg | Freq: Two times a day (BID) | INTRAMUSCULAR | Status: DC

## 2016-11-01 MED ORDER — THIAMINE HCL 100 MG/ML IJ SOLN
100.0000 mg | Freq: Every day | INTRAMUSCULAR | Status: DC
  Administered 2016-11-03: 100 mg via INTRAVENOUS
  Filled 2016-11-01: qty 2

## 2016-11-01 MED ORDER — PIPERACILLIN-TAZOBACTAM 3.375 G IVPB 30 MIN
3.3750 g | Freq: Three times a day (TID) | INTRAVENOUS | Status: DC
  Administered 2016-11-02 (×2): 3.375 g via INTRAVENOUS
  Filled 2016-11-01 (×6): qty 50

## 2016-11-01 MED ORDER — MIDAZOLAM HCL 2 MG/2ML IJ SOLN
INTRAMUSCULAR | Status: AC
  Filled 2016-11-01: qty 2

## 2016-11-01 MED ORDER — LACTATED RINGERS IV SOLN
INTRAVENOUS | Status: DC | PRN
  Administered 2016-11-01 – 2016-11-02 (×2): via INTRAVENOUS

## 2016-11-01 MED ORDER — PROPOFOL 10 MG/ML IV BOLUS
INTRAVENOUS | Status: DC | PRN
  Administered 2016-11-01: 150 mg via INTRAVENOUS

## 2016-11-01 MED ORDER — LIDOCAINE HCL (CARDIAC) 20 MG/ML IV SOLN
INTRAVENOUS | Status: DC | PRN
  Administered 2016-11-01: 80 mg via INTRAVENOUS

## 2016-11-01 MED ORDER — SUCCINYLCHOLINE CHLORIDE 20 MG/ML IJ SOLN
INTRAMUSCULAR | Status: DC | PRN
  Administered 2016-11-01: 100 mg via INTRAVENOUS

## 2016-11-01 MED ORDER — PROPOFOL 10 MG/ML IV BOLUS
INTRAVENOUS | Status: AC
Start: 2016-11-01 — End: 2016-11-01
  Filled 2016-11-01: qty 20

## 2016-11-01 MED ORDER — MIDAZOLAM HCL 2 MG/2ML IJ SOLN
INTRAMUSCULAR | Status: DC | PRN
  Administered 2016-11-01: 2 mg via INTRAVENOUS

## 2016-11-01 SURGICAL SUPPLY — 39 items
BLADE CLIPPER SURG (BLADE) ×3 IMPLANT
BULB RESERV EVAC DRAIN JP 100C (MISCELLANEOUS) ×3 IMPLANT
CANISTER SUCT 1200ML W/VALVE (MISCELLANEOUS) ×3 IMPLANT
CHLORAPREP W/TINT 26ML (MISCELLANEOUS) ×3 IMPLANT
DERMABOND ADVANCED (GAUZE/BANDAGES/DRESSINGS) ×2
DERMABOND ADVANCED .7 DNX12 (GAUZE/BANDAGES/DRESSINGS) ×1 IMPLANT
DRAIN CHANNEL JP 15F RND 16 (MISCELLANEOUS) ×3 IMPLANT
DRAIN PENROSE 1/4X12 LTX (DRAIN) ×3 IMPLANT
DRAIN PENROSE 5/8X18 LTX STRL (WOUND CARE) ×3 IMPLANT
DRAPE INCISE IOBAN 66X45 STRL (DRAPES) ×3 IMPLANT
DRAPE PED LAPAROTOMY (DRAPES) ×3 IMPLANT
DRSG OPSITE POSTOP 4X8 (GAUZE/BANDAGES/DRESSINGS) ×3 IMPLANT
ELECT REM PT RETURN 9FT ADLT (ELECTROSURGICAL) ×3
ELECTRODE REM PT RTRN 9FT ADLT (ELECTROSURGICAL) ×1 IMPLANT
GLOVE BIO SURGEON STRL SZ7 (GLOVE) ×3 IMPLANT
GOWN STRL REUS W/ TWL LRG LVL3 (GOWN DISPOSABLE) ×1 IMPLANT
GOWN STRL REUS W/TWL LRG LVL3 (GOWN DISPOSABLE) ×2
HOLDER FOLEY CATH W/STRAP (MISCELLANEOUS) ×3 IMPLANT
NEEDLE HYPO 22GX1.5 SAFETY (NEEDLE) ×3 IMPLANT
NS IRRIG 1000ML POUR BTL (IV SOLUTION) ×3 IMPLANT
PACK BASIN MINOR ARMC (MISCELLANEOUS) ×3 IMPLANT
SPONGE KITTNER 5P (MISCELLANEOUS) ×3 IMPLANT
SPONGE LAP 18X18 5 PK (GAUZE/BANDAGES/DRESSINGS) ×6 IMPLANT
SUT ETHIBOND 0 MO6 C/R (SUTURE) ×6 IMPLANT
SUT ETHIBOND NAB MO 7 #0 18IN (SUTURE) ×3 IMPLANT
SUT ETHILON 3-0 FS-10 30 BLK (SUTURE) ×3
SUT MNCRL AB 4-0 PS2 18 (SUTURE) ×6 IMPLANT
SUT SILK 2 0 (SUTURE) ×2
SUT SILK 2-0 18XBRD TIE 12 (SUTURE) ×1 IMPLANT
SUT VIC AB 2-0 SH 27 (SUTURE) ×2
SUT VIC AB 2-0 SH 27XBRD (SUTURE) ×1 IMPLANT
SUT VIC AB 3-0 54X BRD REEL (SUTURE) ×1 IMPLANT
SUT VIC AB 3-0 BRD 54 (SUTURE) ×2
SUT VIC AB 3-0 SH 27 (SUTURE) ×2
SUT VIC AB 3-0 SH 27X BRD (SUTURE) ×1 IMPLANT
SUTURE EHLN 3-0 FS-10 30 BLK (SUTURE) ×1 IMPLANT
SYR 20CC LL (SYRINGE) ×3 IMPLANT
SYR 30ML LL (SYRINGE) ×3 IMPLANT
TRAY FOLEY CATH SILVER 16FR LF (SET/KITS/TRAYS/PACK) ×3 IMPLANT

## 2016-11-01 NOTE — ED Notes (Signed)
Ems pt to lobby via wheelchair, with lower abd pain.

## 2016-11-01 NOTE — H&P (Addendum)
Patient ID: Curtis [name], male   DOB: 21-Jul-1955, 61 y.o.   MRN: [id number]  HPI Curtis [name] is a 61 y.o. male with hx ETOH dependence and long hx of RIH. Comes now w emesis and pain on RIH. HE was drinking apparently when this happened. HE reports the pain is moderate, sharp and worsening w movements, located R groin. HE is a poor historian given his ETOH hx. No fevers or chills. No previous abd surgeries. Last drink was this afternoon. + nausea. ABd KUB  Pers. reviewed concerned for SBO, dilated loop SB, no free air.   Nml WBC and PL. Coags pending , nml creat.   HPI  Past Medical History:  Diagnosis Date  . EtOH dependence [facility])     Past Surgical History:  Procedure Laterality Date  . NO PAST SURGERIES      Family History  Problem Relation Age of Onset  . Family history unknown: Yes    Social History Social History  Substance Use Topics  . Smoking status: Current Every Day Smoker    Packs/day: 1.00    Types: Cigarettes  . Smokeless tobacco: Never Used  . Alcohol use 75.6 oz/week    126 Cans of beer per week    No Known Allergies  Current Facility-Administered Medications  Medication Dose Route Frequency Provider Last Rate Last Dose  . piperacillin-tazobactam (ZOSYN) IVPB 3.375 g  3.375 g Intravenous Q8H Lunden Stieber F, MD      . sodium chloride 0.9 % bolus 1,000 mL  1,000 mL Intravenous Once Atanacio Melnyk, Merri Ray, MD       Current Outpatient Prescriptions  Medication Sig Dispense Refill  . chlordiazePOXIDE (LIBRIUM) 5 MG capsule Take 1 capsule (5 mg total) by mouth 3 (three) times daily. 6 capsule 0  . folic acid (FOLVITE) 1 MG tablet Take 1 tablet (1 mg total) by mouth daily. 30 tablet 0  . Multiple Vitamin (MULTIVITAMIN WITH MINERALS) TABS tablet Take 1 tablet by mouth daily. 30 tablet 0  . thiamine 100 MG tablet Take 1 tablet (100 mg total) by mouth daily. 30 tablet 0     Review of Systems Full ROS  was asked and was negative except for the information on the  HPI  Physical Exam Blood pressure (!) 116/51, pulse 65, temperature 98.6 F (37 C), temperature source Oral, resp. rate 18, height 5\' 7"  (1.702 m), weight 74.8 kg (165 lb), SpO2 99 %. CONSTITUTIONAL: Pt disheveled and malnourished EYES: Pupils are equal, round, and reactive to light, Sclera are non-icteric. EARS, NOSE, MOUTH AND THROAT: The oropharynx is clear. The oral mucosa is pink and moist. Hearing is intact to voice. LYMPH NODES:  Lymph nodes in the neck are normal. RESPIRATORY:  Lungs are clear. There is normal respiratory effort, with equal breath sounds bilaterally, and without pathologic use of accessory muscles. CARDIOVASCULAR: Heart is regular without murmurs, gallops, or rubs. GI: The abdomen is soft, exquisitely tender RIH, non reducible., no overt peritonitis GU: Rectal deferred.   MUSCULOSKELETAL: Normal muscle strength and tone. No cyanosis or edema.   SKIN: Turgor is good and there are no pathologic skin lesions or ulcers. NEUROLOGIC: Motor and sensation is grossly normal. Cranial nerves are grossly intact. PSYCH:  Oriented to person, place and time. Affect is normal. Competent  Data Reviewed  I have personally reviewed the patient's imaging, laboratory findings and medical records.    Assessment Plan 61 yo male w  Hx ETOH dependence and withdrawal, comes with an incarcerated inguinal hernia  and signs of obstruction. Despite his significant risks factors, he has an strangulated hernia causing a bowel obstruction. We do not have any other choice but to do emergency surgery. D/W him in detail about significant risks of morbiditiy and mortality. ( risks factors include smoking, ETOH dependence, hx thrombocytopenia, Malnutrition) Child A. I have explained the procedure, risks, and aftercare of inguinal hernia repair to Curtis [name].   Risks include but are not limited to bleeding, infection, wound problems, anesthesia, recurrence, bladder or intestine injury, urinary retention,  testicular dysfunction, chronic pain, mesh problems.  He  seems to understand and agrees to proceed.  Questions were answered to his stated satisfaction. HE has significant risks perioperative of developing DT and this has a high mortality rate. He understands and is in agreement.  We will start A/Bs and crystalloids and Do emergency surgery tonight.  Sterling Bigiego Latausha Flamm, MD FACS General Surgeon 11/01/2016, 10:57 PM

## 2016-11-01 NOTE — ED Notes (Signed)
Procedure consent signed and placed on pt's chart

## 2016-11-01 NOTE — Anesthesia Preprocedure Evaluation (Signed)
Anesthesia Evaluation  Patient identified by MRN, date of birth, ID band Patient awake    Reviewed: Allergy & Precautions, NPO status , Patient's Chart, lab work & pertinent test results  History of Anesthesia Complications Negative for: history of anesthetic complications  Airway Mallampati: II       Dental  (+) Poor Dentition, Chipped, Missing   Pulmonary Current Smoker,           Cardiovascular negative cardio ROS       Neuro/Psych Seizures - (secondary to ETOH withdrawal),     GI/Hepatic negative GI ROS, (+)     substance abuse  alcohol use,   Endo/Other  negative endocrine ROS  Renal/GU negative Renal ROS     Musculoskeletal   Abdominal   Peds  Hematology negative hematology ROS (+)   Anesthesia Other Findings   Reproductive/Obstetrics                             Anesthesia Physical Anesthesia Plan  ASA: III and emergent  Anesthesia Plan: General   Post-op Pain Management:    Induction: Intravenous and Rapid sequence  PONV Risk Score and Plan: 2 and Ondansetron and Dexamethasone  Airway Management Planned: Oral ETT  Additional Equipment:   Intra-op Plan:   Post-operative Plan:   Informed Consent: I have reviewed the patients History and Physical, chart, labs and discussed the procedure including the risks, benefits and alternatives for the proposed anesthesia with the patient or authorized representative who has indicated his/her understanding and acceptance.     Plan Discussed with:   Anesthesia Plan Comments:         Anesthesia Quick Evaluation

## 2016-11-01 NOTE — ED Provider Notes (Signed)
[facility] Emergency Department Provider Note  ____________________________________________   First MD Initiated Contact with Patient 11/01/16 2152     (approximate)  I have reviewed the triage vital signs and the nursing notes.   HISTORY  Chief Complaint Abdominal Pain   HPI Curtis [name] is a 61 y.o. male with a history of alcoholism as well as incarcerated right inguinal hernia and a small bowel obstruction was presented to emergency department with right lower quadrant abdominal pain as well as nausea and vomiting. He says that he is having no pain at this point during this interview but that earlier in the day when he was moving a potted plant he was having pain to the right lower quadrant. He said that he also vomited. Says that over the past week and a half the swelling to his right lower quadrant inguinal hernia has increased as well.   Past Medical History:  Diagnosis Date  . EtOH dependence [facility])     Patient Active Problem List   Diagnosis Date Noted  . SBO (small bowel obstruction) (HCC)   . Incarcerated right inguinal hernia   . Seizures (HCC) 06/26/2016  . Non-recurrent unilateral inguinal hernia without obstruction or gangrene   . Alcohol withdrawal (HCC) 06/21/2016    Past Surgical History:  Procedure Laterality Date  . NO PAST SURGERIES      Prior to Admission medications   Medication Sig Start Date End Date Taking? Authorizing Provider  chlordiazePOXIDE (LIBRIUM) 5 MG capsule Take 1 capsule (5 mg total) by mouth 3 (three) times daily. 06/28/16   Milagros Loll, MD  folic acid (FOLVITE) 1 MG tablet Take 1 tablet (1 mg total) by mouth daily. 06/25/16   Shaune Pollack, MD  Multiple Vitamin (MULTIVITAMIN WITH MINERALS) TABS tablet Take 1 tablet by mouth daily. 06/25/16   Shaune Pollack, MD  thiamine 100 MG tablet Take 1 tablet (100 mg total) by mouth daily. 06/25/16   Shaune Pollack, MD    Allergies Patient has no known allergies.  Family  History  Problem Relation Age of Onset  . Family history unknown: Yes    Social History Social History  Substance Use Topics  . Smoking status: Current Every Day Smoker    Packs/day: 1.00    Types: Cigarettes  . Smokeless tobacco: Never Used  . Alcohol use 75.6 oz/week    126 Cans of beer per week    Review of Systems  Constitutional: No fever/chills Eyes: No visual changes. ENT: No sore throat. Cardiovascular: Denies chest pain. Respiratory: Denies shortness of breath. Gastrointestinal:  No diarrhea.  No constipation. Genitourinary: Negative for dysuria. Musculoskeletal: Negative for back pain. Skin: Negative for rash. Neurological: Negative for headaches, focal weakness or numbness.   ____________________________________________   PHYSICAL EXAM:  VITAL SIGNS: ED Triage Vitals  Enc Vitals Group     BP 11/01/16 1727 (!) 116/51     Pulse Rate 11/01/16 1727 65     Resp 11/01/16 1727 18     Temp 11/01/16 1727 98.6 F (37 C)     Temp Source 11/01/16 1727 Oral     SpO2 11/01/16 1727 99 %     Weight 11/01/16 1727 165 lb (74.8 kg)     Height 11/01/16 1727 5\' 7"  (1.702 m)     Head Circumference --      Peak Flow --      Pain Score 11/01/16 1726 8     Pain Loc --      Pain  Edu? --      Excl. in GC? --     Constitutional: Alert and oriented. Well appearing and in no acute distress. Eyes: Conjunctivae are normal.  Head: Atraumatic. Nose: No congestion/rhinnorhea. Mouth/Throat: Mucous membranes are moist.  Neck: No stridor.   Cardiovascular: Normal rate, regular rhythm. Grossly normal heart sounds.   Respiratory: Normal respiratory effort.  No retractions. Lungs CTAB. Gastrointestinal: Soft and nontender. No distention.  Genitourinary:  Right groin with large, tense swelling in the inguinal region and extending into the right hemiscrotum. I'm unable to reduce the large hernia with the patient lying flat. He is moderately to severely tender to palpation over this  region. Musculoskeletal: No lower extremity tenderness nor edema.  No joint effusions. Neurologic:  Normal speech and language. No gross focal neurologic deficits are appreciated. Skin:  Skin is warm, dry and intact. No rash noted. Psychiatric: Mood and affect are normal. Speech and behavior are normal.  ____________________________________________   LABS (all labs ordered are listed, but only abnormal results are displayed)  Labs Reviewed  COMPREHENSIVE METABOLIC PANEL - Abnormal; Notable for the following:       Result Value   Sodium 131 (*)    Potassium 3.2 (*)    Chloride 93 (*)    Glucose, Bld 187 (*)    Creatinine, Ser 0.56 (*)    Calcium 8.6 (*)    AST 48 (*)    Anion gap 16 (*)    All other components within normal limits  CBC - Abnormal; Notable for the following:    RBC 4.13 (*)    All other components within normal limits  LIPASE, BLOOD  URINALYSIS, COMPLETE (UACMP) WITH MICROSCOPIC  LACTIC ACID, PLASMA  LACTIC ACID, PLASMA  PROTIME-INR  APTT  TYPE AND SCREEN   ____________________________________________  EKG  ED ECG REPORT I, Arelia LongestSchaevitz,  Leray Garverick M, the attending physician, personally viewed and interpreted this ECG.   Date: 11/01/2016  EKG Time: 2315  Rate: 84  Rhythm: normal sinus rhythm  Axis: Normal  Intervals:none  ST&T Change: No ST segment elevation or depression. Single T-wave inversion in aVL. Single T-wave inversion in aVL ____________________________________________  RADIOLOGY  X-rays reviewed by Dr. Everlene FarrierPabon of surgery who believes that he sees evidence of obstruction. ____________________________________________   PROCEDURES  Procedure(s) performed:   Procedures  Critical Care performed:   ____________________________________________   INITIAL IMPRESSION / ASSESSMENT AND PLAN / ED COURSE  Pertinent labs & imaging results that were available during my care of the patient were reviewed by me and considered in my medical decision  making (see chart for details).  ----------------------------------------- 11:18 PM on 11/01/2016 -----------------------------------------  Surgery, Dr. Everlene FarrierPabon, was called immediately after the examination and he was present in the emergency department at the bedside during the x-ray. Plan is for the patient to be taken to the operating room to fix his incarcerated hernia.      ____________________________________________   FINAL CLINICAL IMPRESSION(S) / ED DIAGNOSES  Final diagnoses:  SBO (small bowel obstruction) (HCC)  Alcoholism. Incarcerated inguinal hernia.  NEW MEDICATIONS STARTED DURING THIS VISIT:  New Prescriptions   No medications on file     Note:  This document was prepared using Dragon voice recognition software and may include unintentional dictation errors.     Myrna BlazerSchaevitz, Harrington Jobe Matthew, MD 11/01/16 [phone]

## 2016-11-01 NOTE — ED Triage Notes (Signed)
Pt reports w/ c/o abd pain that started today. Pt leaned over in chair, clutching side, speaking in full sentences. Pt A/OX4, resp even and unlabored. Pt sts pain began this AM, reports 1 episode of emesis. Pt sts 'I ate something bad' and "it's tearing me up inside". Pt instructed not to lay on ground and stay in wheelchair

## 2016-11-01 NOTE — ED Notes (Signed)
Pt transported to surgery

## 2016-11-02 ENCOUNTER — Encounter: Payer: Self-pay | Admitting: Surgery

## 2016-11-02 DIAGNOSIS — K4030 Unilateral inguinal hernia, with obstruction, without gangrene, not specified as recurrent: Secondary | ICD-10-CM

## 2016-11-02 DIAGNOSIS — K460 Unspecified abdominal hernia with obstruction, without gangrene: Secondary | ICD-10-CM | POA: Diagnosis present

## 2016-11-02 DIAGNOSIS — E440 Moderate protein-calorie malnutrition: Secondary | ICD-10-CM | POA: Insufficient documentation

## 2016-11-02 LAB — COMPREHENSIVE METABOLIC PANEL
ALBUMIN: 3.5 g/dL (ref 3.5–5.0)
ALK PHOS: 85 U/L (ref 38–126)
ALT: 26 U/L (ref 17–63)
ANION GAP: 8 (ref 5–15)
AST: 33 U/L (ref 15–41)
BILIRUBIN TOTAL: 1.2 mg/dL (ref 0.3–1.2)
BUN: 10 mg/dL (ref 6–20)
CALCIUM: 8.3 mg/dL — AB (ref 8.9–10.3)
CO2: 26 mmol/L (ref 22–32)
Chloride: 98 mmol/L — ABNORMAL LOW (ref 101–111)
Creatinine, Ser: 0.73 mg/dL (ref 0.61–1.24)
GFR calc Af Amer: >60 mL/min (ref >60)
GFR calc non Af Amer: >60 mL/min (ref >60)
GLUCOSE: 171 mg/dL — AB (ref 65–99)
POTASSIUM: 4.7 mmol/L (ref 3.5–5.1)
Sodium: 132 mmol/L — ABNORMAL LOW (ref 135–145)
Total Protein: 6.5 g/dL (ref 6.5–8.1)

## 2016-11-02 LAB — CBC
HEMATOCRIT: 35.2 % — AB (ref 40.0–52.0)
Hemoglobin: 12.2 g/dL — ABNORMAL LOW (ref 13.0–18.0)
MCH: 33.5 pg (ref 26.0–34.0)
MCHC: 34.7 g/dL (ref 32.0–36.0)
MCV: 96.4 fL (ref 80.0–100.0)
Platelets: 138 10*3/uL — ABNORMAL LOW (ref 150–440)
RBC: 3.66 MIL/uL — AB (ref 4.40–5.90)
RDW: 13.6 % (ref 11.5–14.5)
WBC: 9.3 10*3/uL (ref 3.8–10.6)

## 2016-11-02 LAB — PHOSPHORUS: Phosphorus: 3.2 mg/dL (ref 2.5–4.6)

## 2016-11-02 LAB — APTT
aPTT: 30 seconds (ref 24–36)
aPTT: 30 seconds (ref 24–36)

## 2016-11-02 LAB — TYPE AND SCREEN
ABO/RH(D): B POS
Antibody Screen: NEGATIVE

## 2016-11-02 LAB — PROTIME-INR
INR: 1.11
INR: 1.11
PROTHROMBIN TIME: 14.3 s (ref 11.4–15.2)
Prothrombin Time: 14.4 seconds (ref 11.4–15.2)

## 2016-11-02 LAB — MAGNESIUM: Magnesium: 1.4 mg/dL — ABNORMAL LOW (ref 1.7–2.4)

## 2016-11-02 LAB — LACTIC ACID, PLASMA: Lactic Acid, Venous: 3.2 mmol/L (ref 0.5–1.9)

## 2016-11-02 MED ORDER — FENTANYL CITRATE (PF) 100 MCG/2ML IJ SOLN: 25.0000 ug | INTRAMUSCULAR | Status: DC | PRN

## 2016-11-02 MED ORDER — BUPIVACAINE LIPOSOME 1.3 % IJ SUSP
INTRAMUSCULAR | Status: AC
  Filled 2016-11-02: qty 20

## 2016-11-02 MED ORDER — DEXAMETHASONE SODIUM PHOSPHATE 10 MG/ML IJ SOLN
INTRAMUSCULAR | Status: AC
  Filled 2016-11-02: qty 1

## 2016-11-02 MED ORDER — OXYCODONE HCL 5 MG PO TABS
5.0000 mg | ORAL_TABLET | ORAL | Status: DC | PRN
  Administered 2016-11-02: 10 mg via ORAL
  Filled 2016-11-02: qty 2

## 2016-11-02 MED ORDER — HEPARIN SODIUM (PORCINE) 5000 UNIT/ML IJ SOLN
5000.0000 [IU] | Freq: Three times a day (TID) | INTRAMUSCULAR | Status: DC
  Administered 2016-11-03: 5000 [IU] via SUBCUTANEOUS
  Filled 2016-11-02: qty 1

## 2016-11-02 MED ORDER — MORPHINE SULFATE (PF) 4 MG/ML IV SOLN: 2.0000 mg | INTRAVENOUS | Status: DC | PRN

## 2016-11-02 MED ORDER — ONDANSETRON 4 MG PO TBDP: 4.0000 mg | ORAL_TABLET | Freq: Four times a day (QID) | ORAL | Status: DC | PRN

## 2016-11-02 MED ORDER — DEXAMETHASONE SODIUM PHOSPHATE 10 MG/ML IJ SOLN
INTRAMUSCULAR | Status: DC | PRN
  Administered 2016-11-02: 10 mg via INTRAVENOUS

## 2016-11-02 MED ORDER — KETOROLAC TROMETHAMINE 30 MG/ML IJ SOLN
30.0000 mg | Freq: Four times a day (QID) | INTRAMUSCULAR | Status: DC | PRN
  Filled 2016-11-02: qty 1

## 2016-11-02 MED ORDER — SUGAMMADEX SODIUM 200 MG/2ML IV SOLN
INTRAVENOUS | Status: DC | PRN
  Administered 2016-11-02: 150 mg via INTRAVENOUS

## 2016-11-02 MED ORDER — ACETAMINOPHEN 500 MG PO TABS
1000.0000 mg | ORAL_TABLET | Freq: Four times a day (QID) | ORAL | Status: DC
  Administered 2016-11-02 – 2016-11-03 (×5): 1000 mg via ORAL
  Filled 2016-11-02 (×5): qty 2

## 2016-11-02 MED ORDER — ALBUMIN HUMAN 5 % IV SOLN
INTRAVENOUS | Status: AC
Start: 2016-11-02 — End: 2016-11-02
  Filled 2016-11-02: qty 250

## 2016-11-02 MED ORDER — ALBUMIN HUMAN 5 % IV SOLN
INTRAVENOUS | Status: DC | PRN
  Administered 2016-11-02: 01:00:00 via INTRAVENOUS

## 2016-11-02 MED ORDER — PIPERACILLIN-TAZOBACTAM 3.375 G IVPB
INTRAVENOUS | Status: AC
Start: 2016-11-02 — End: 2016-11-02
  Filled 2016-11-02: qty 50

## 2016-11-02 MED ORDER — ONDANSETRON HCL 4 MG/2ML IJ SOLN
INTRAMUSCULAR | Status: AC
  Filled 2016-11-02: qty 2

## 2016-11-02 MED ORDER — PANTOPRAZOLE SODIUM 40 MG IV SOLR
40.0000 mg | Freq: Two times a day (BID) | INTRAVENOUS | Status: DC
  Administered 2016-11-02 – 2016-11-03 (×3): 40 mg via INTRAVENOUS
  Filled 2016-11-02 (×3): qty 40

## 2016-11-02 MED ORDER — FENTANYL CITRATE (PF) 100 MCG/2ML IJ SOLN
INTRAMUSCULAR | Status: AC
  Filled 2016-11-02: qty 2

## 2016-11-02 MED ORDER — ONDANSETRON HCL 4 MG/2ML IJ SOLN: 4.0000 mg | Freq: Once | INTRAMUSCULAR | Status: DC | PRN

## 2016-11-02 MED ORDER — ADULT MULTIVITAMIN W/MINERALS CH
1.0000 | ORAL_TABLET | Freq: Every day | ORAL | Status: DC
  Administered 2016-11-02 – 2016-11-03 (×2): 1 via ORAL
  Filled 2016-11-02 (×2): qty 1

## 2016-11-02 MED ORDER — KETOROLAC TROMETHAMINE 30 MG/ML IJ SOLN
30.0000 mg | Freq: Four times a day (QID) | INTRAMUSCULAR | Status: DC
  Administered 2016-11-02 – 2016-11-03 (×6): 30 mg via INTRAVENOUS
  Filled 2016-11-02 (×11): qty 1

## 2016-11-02 MED ORDER — PIPERACILLIN-TAZOBACTAM 3.375 G IVPB
3.3750 g | Freq: Three times a day (TID) | INTRAVENOUS | Status: AC
  Administered 2016-11-02 (×2): 3.375 g via INTRAVENOUS
  Filled 2016-11-02: qty 50

## 2016-11-02 MED ORDER — KETOROLAC TROMETHAMINE 30 MG/ML IJ SOLN
INTRAMUSCULAR | Status: AC
  Filled 2016-11-02: qty 1

## 2016-11-02 MED ORDER — BUPIVACAINE HCL (PF) 0.5 % IJ SOLN
INTRAMUSCULAR | Status: AC
Start: 2016-11-02 — End: 2016-11-02
  Filled 2016-11-02: qty 30

## 2016-11-02 MED ORDER — NICOTINE 14 MG/24HR TD PT24
14.0000 mg | MEDICATED_PATCH | Freq: Every day | TRANSDERMAL | Status: DC
  Administered 2016-11-02 – 2016-11-03 (×2): 14 mg via TRANSDERMAL
  Filled 2016-11-02 (×2): qty 1

## 2016-11-02 MED ORDER — BOOST / RESOURCE BREEZE PO LIQD
1.0000 | Freq: Three times a day (TID) | ORAL | Status: DC
  Administered 2016-11-02 (×2): 1 via ORAL

## 2016-11-02 MED ORDER — DEXTROSE IN LACTATED RINGERS 5 % IV SOLN
INTRAVENOUS | Status: DC
  Administered 2016-11-02 (×3): via INTRAVENOUS

## 2016-11-02 MED ORDER — PHENYLEPHRINE HCL 10 MG/ML IJ SOLN
INTRAMUSCULAR | Status: DC | PRN
  Administered 2016-11-02: 100 ug via INTRAVENOUS

## 2016-11-02 MED ORDER — ONDANSETRON HCL 4 MG/2ML IJ SOLN: 4.0000 mg | Freq: Four times a day (QID) | INTRAMUSCULAR | Status: DC | PRN

## 2016-11-02 MED ORDER — ROCURONIUM BROMIDE 100 MG/10ML IV SOLN
INTRAVENOUS | Status: DC | PRN
  Administered 2016-11-02: 10 mg via INTRAVENOUS
  Administered 2016-11-02: 30 mg via INTRAVENOUS

## 2016-11-02 MED ORDER — SUGAMMADEX SODIUM 200 MG/2ML IV SOLN
INTRAVENOUS | Status: AC
  Filled 2016-11-02: qty 2

## 2016-11-02 MED ORDER — ONDANSETRON HCL 4 MG/2ML IJ SOLN
INTRAMUSCULAR | Status: DC | PRN
  Administered 2016-11-02: 4 mg via INTRAVENOUS

## 2016-11-02 MED ORDER — HYDRALAZINE HCL 20 MG/ML IJ SOLN: 10.0000 mg | INTRAMUSCULAR | Status: DC | PRN

## 2016-11-02 MED ORDER — BUPIVACAINE LIPOSOME 1.3 % IJ SUSP
INTRAMUSCULAR | Status: DC | PRN
  Administered 2016-11-02: 50 mL

## 2016-11-02 MED ORDER — ENSURE ENLIVE PO LIQD
237.0000 mL | Freq: Two times a day (BID) | ORAL | Status: DC
  Administered 2016-11-02 – 2016-11-03 (×2): 237 mL via ORAL

## 2016-11-02 NOTE — Anesthesia Postprocedure Evaluation (Signed)
Anesthesia Post Note  Patient: Curtis [name]  Procedure(s) Performed: Procedure(s) (LRB): HERNIA REPAIR INGUINAL INCARCERATED (Right)  Patient location during evaluation: PACU Anesthesia Type: General Level of consciousness: awake and alert Pain management: pain level controlled Vital Signs Assessment: post-procedure vital signs reviewed and stable Respiratory status: spontaneous breathing and respiratory function stable Cardiovascular status: stable Anesthetic complications: no     Last Vitals:  Vitals:   11/02/16 0157 11/02/16 0212  BP: (!) 154/66 (!) 169/67  Pulse: 91 80  Resp: 20 18  Temp:  36.9 C    Last Pain:  Vitals:   11/02/16 0212  TempSrc:   PainSc: 0-No pain                 KEPHART,WILLIAM K

## 2016-11-02 NOTE — Anesthesia Procedure Notes (Signed)
Procedure Name: Intubation Date/Time: 11/02/2016 11:56 PM Performed by: Aline Brochure Pre-anesthesia Checklist: Patient identified, Emergency Drugs available, Suction available and Patient being monitored Patient Re-evaluated:Patient Re-evaluated prior to induction Oxygen Delivery Method: Circle system utilized Preoxygenation: Pre-oxygenation with 100% oxygen Induction Type: IV induction, Rapid sequence and Cricoid Pressure applied Laryngoscope Size: Mac and 3 Grade View: Grade III Tube type: Oral Tube size: 7.5 mm Number of attempts: 1 Airway Equipment and Method: Stylet Placement Confirmation: positive ETCO2 and breath sounds checked- equal and bilateral Secured at: 22 cm Tube secured with: Tape Dental Injury: Teeth and Oropharynx as per pre-operative assessment

## 2016-11-02 NOTE — Progress Notes (Signed)
Initial Nutrition Assessment  DOCUMENTATION CODES:   Non-severe (moderate) malnutrition in context of chronic illness  INTERVENTION:   Boost Breeze po TID, each supplement provides 250 kcal and 9 grams of protein  Ensure Enlive po BID, each supplement provides 350 kcal and 20 grams of protein  MVI  NUTRITION DIAGNOSIS:   Malnutrition (moderate) related to  (etoh abuse, incarcerated inguinal hernia ) as evidenced by moderate depletion of body fat, moderate depletions of muscle mass, energy intake < or equal to 75% for > or equal to 1 month.  GOAL:   Patient will meet greater than or equal to 90% of their needs  MONITOR:   PO intake, Supplement acceptance, Labs, Weight trends, I & O's  REASON FOR ASSESSMENT:   Malnutrition Screening Tool    ASSESSMENT:    61 y.o. male with a history of alcoholism as well as incarcerated right inguinal hernia and a small bowel obstruction was presented to emergency department with right lower quadrant abdominal pain as well as nausea and vomiting. Now s/p hernia repair 7/20   Met with pt in room today. Pt reports intermittent poor appetite for several months pta. Pt reports that he has lost 10lbs but per chart, he appears to be weight stable. Pt reports that he did not eat much breakfast this morning; he is unhappy with the clear liquid diet. Pt reports that milk products make him vomit; RD explained that Ensure is lactose free. Pt is willing to try Ensure when diet advanced; can keep pt on Boost Breeze if needed.    Medications reviewed and include: heparin, nicotine, protonix, thiamine, zosyn  Labs reviewed: Na 132(L), Cl 98(L), Ca 8.3(L), P 3.2 wnl, Mg 1.4(L) cbgs- 187, 171 x 24 hrs  Nutrition-Focused physical exam completed. Findings are moderate fat depletion in arms, moderate muscle depletion in legs, and no edema.   Diet Order:  Diet clear liquid Room service appropriate? Yes  Skin:  Wound (see comment) (abdominal incision )  Last  BM:  7/18  Height:   Ht Readings from Last 1 Encounters:  11/01/16 5' 7" (1.702 m)    Weight:   Wt Readings from Last 1 Encounters:  11/01/16 165 lb (74.8 kg)    Ideal Body Weight:  67.2 kg  BMI:  Body mass index is 25.84 kg/m.  Estimated Nutritional Needs:   Kcal:  2000-2300kcal/day   Protein:  90-105g/day   Fluid:  >2L/day   EDUCATION NEEDS:   Education needs addressed  Casey Campbell MS, RD, LDN Pager #- [phone] After Hours Pager: [phone]  

## 2016-11-02 NOTE — Progress Notes (Signed)
SURGICAL PROGRESS NOTE  Hospital Day(s): 0.   Post op day(s): 1 Day Post-Op.   Interval History: Patient seen and examined, no acute events or new complaints since surgery. Patient reports +flatus and only mild Right groin "sore"ness, denies any fever/chills, N/V, CP, or SOB. Patient states that he typically drinks ~40 oz beers x 3 daily and feels increasingly "nervous" if he does not drink alcohol. He currently denies such feelings and states he plans to stay with his daughter upon discharge from the hospital. He also says he doesn't anticipate any reason he won't be able to follow-up outpatient in 2 weeks.  Review of Systems:  Constitutional: denies fever, chills  HEENT: denies cough or congestion  Respiratory: denies any shortness of breath  Cardiovascular: denies chest pain or palpitations  Gastrointestinal: abdominal pain, N/V, and bowel function as per interval history Genitourinary: denies burning with urination or urinary frequency Musculoskeletal: denies pain, decreased motor or sensation Integumentary: denies any other rashes or skin discolorations except post-surgical groin wound Neurological: denies HA or vision/hearing changes   Vital signs in last 24 hours: [min-max] current  Temp:  [97.2 F (36.2 C)-98.6 F (37 C)] 97.9 F (36.6 C) (07/20 0431) Pulse Rate:  [65-91] 73 (07/20 0431) Resp:  [14-20] 20 (07/20 0431) BP: (116-169)/(51-76) 144/61 (07/20 0431) SpO2:  [95 %-100 %] 100 % (07/20 0431) Weight:  [165 lb (74.8 kg)] 165 lb (74.8 kg) (07/19 1727)     Height: 5\' 7"  (170.2 cm) Weight: 165 lb (74.8 kg) BMI (Calculated): 25.9   Intake/Output this shift:  No intake/output data recorded. Only small amount of sero-sanguinous fluid in JP drain bulb  Intake/Output last 2 shifts:  @IOLAST2SHIFTS @   Physical Exam:  Constitutional: alert, cooperative and no distress  HENT: normocephalic without obvious abnormality  Eyes: PERRL, EOM's grossly intact and symmetric   Neuro: CN II - XII grossly intact and symmetric without deficit  Respiratory: breathing non-labored at rest  Cardiovascular: regular rate and sinus rhythm  Gastrointestinal: soft and non-distended, mild peri-incisional and peri-drain tenderness to palpation  Musculoskeletal: UE and LE FROM, no edema or wounds, motor and sensation grossly intact, NT   Labs:  CBC Latest Ref Rng & Units 11/02/2016 11/01/2016 06/27/2016  WBC 3.8 - 10.6 K/uL 9.3 9.5 5.5  Hemoglobin 13.0 - 18.0 g/dL 12.2(L) 13.9 11.7(L)  Hematocrit 40.0 - 52.0 % 35.2(L) 40.1 34.0(L)  Platelets 150 - 440 K/uL 138(L) 181 157   CMP Latest Ref Rng & Units 11/02/2016 11/01/2016 06/27/2016  Glucose 65 - 99 mg/dL 161(W) 960(A) 91  BUN 6 - 20 mg/dL 10 7 7   Creatinine 0.61 - 1.24 mg/dL 5.40 9.81(X) 9.14  Sodium 135 - 145 mmol/L 132(L) 131(L) 135  Potassium 3.5 - 5.1 mmol/L 4.7 3.2(L) 3.7  Chloride 101 - 111 mmol/L 98(L) 93(L) 109  CO2 22 - 32 mmol/L 26 22 21(L)  Calcium 8.9 - 10.3 mg/dL 8.3(L) 8.6(L) 8.6(L)  Total Protein 6.5 - 8.1 g/dL 6.5 7.3 -  Total Bilirubin 0.3 - 1.2 mg/dL 1.2 0.7 -  Alkaline Phos 38 - 126 U/L 85 100 -  AST 15 - 41 U/L 33 48(H) -  ALT 17 - 63 U/L 26 33 -   Imaging studies: No new pertinent imaging studies   Assessment/Plan: (ICD-10's: K40.30) 61 y.o. male doing overall well 1 Day Post-Op s/p open primary Shouldice repair of incarcerated Right inguinal hernia for associated small bowel obstruction, complicated by pertinent comorbidities including ongoing chronic alcohol and tobacco dependence/abuse and lack of  routine/preventative medical care.   - pain control prn  - gradually advance diet as tolerated   - monitor and CIWA protocol for alcohol withdrawal (DT) prophylaxis   - cessation of smoking and alcohol advised and discussed, patient declines nicotine patch  - anticipate discharge planning tomorrow +/- drain  - monitor drain output volume  All of the above findings and recommendations were discussed  with the patient, and all of patient's questions were answered to his expressed satisfaction.  -- Scherrie GerlachJason E. Earlene Plateravis, MD, RPVI : [facility] General Surgery - Partnering for exceptional care. Office: [phone]

## 2016-11-02 NOTE — Op Note (Signed)
Open Right Inguinal Hernia Repair ( Tissue)  Curtis [name]  11/02/2016  Pre-operative Diagnosis: Strangulated Right Inguinal Hernia  Post-operative Diagnosis: same  Procedure: Openl repair Right  inguinal hernia ( Tissue)   Surgeon: Curtis Big, MD FACS  Anesthesia: Gen. with endotracheal tube   Findings: 1.  Strangulated RIH Causing mechanical bowel obstruction with TI and Cecum within hernia sac 2.Very mild contusion on the mesentery of the small bowel but viable cecum and TI 3. Giant Indirect IH 4. Ascites within the hernia sac but no ascites in the intra-abdominal cavity  Procedure Details  The patient was seen again in the Holding Room. The benefits, complications, treatment options, and expected outcomes were discussed with the patient. The risks of bleeding, infection, recurrence of symptoms, failure to resolve symptoms, recurrence of hernia, ischemic orchitis, chronic pain syndrome or neuroma, were discussed again. The likelihood of improving the patient's symptoms with return to their baseline status is good.  The patient and/or family concurred with the proposed plan, giving informed consent.  The patient was taken to Operating Room, identified as Curtis [name] and the procedure verified as Inguinal Hernia Repair. Laterality confirmed.  A Time Out was held and the above information confirmed.  Prior to the induction of general anesthesia, antibiotic prophylaxis was administered. VTE prophylaxis was in place. General endotracheal anesthesia was then administered and tolerated well. After the induction, the abdomen was prepped with Chloraprep and draped in the sterile fashion. The patient was positioned in the supine position.  Generous incision was created in the right lower quadrant using a 10 blade knife. The cutter was used to dissect through subcutaneous Tissue and through the external oblique muscle. There was the incarcerated inguinal hernia and the fact that was unable to be  reduced. I opened the sac and also opened some of the external oblique muscle as well as the inguinal ligament to allow reduction of the bowel contents. We expected the bowel and there was only a small minor contusion in the mesentery small bowel. Cecum and small bowel were within the hernia sac. All the bowel contents were pushed into the intra-abdominal cavity in the standard fashion. The hernia sac was dissected and the vessels and the, second structures were preserved in the standard fashion. We please note that we identified the ilioinguinal nerve and it was so fragile and debilitated that we divided it and cauterizing the standard fashion. The hernia sac was excised and at the proximal sac was closed using a 2-0 silk in a pursestring fashion. Adequate dissection show evidence of an indirect inguinal hernia. Because of evidence of previous incarceration and significant risk factors I decided to do a primary repair because of concerns of possible mesh infection. A Shouldice repair was collected and were able to dissect the conjunctiva tender and internal oblique muscles. The repair was use incorporating approximating the conjoined tendon to the shelving edge of the inguinal ligament and more laterally the shelving edge of the inguinal ligament to the internal oblique muscle. There was adequate or construction of the inguinal canal. Because the massive angina hernia and the massive sac I decided to leave a Blake drain. #15 Blake drain was placed in the standard fashion and secured to skin   The external oblique muscle was closed with a 2-0 Vicryl in a running fashion. Some Marcaine was used to block the ilioinguinal nerve and infiltrate the incision site. Skin was closed with staples and a sterile dressing was placed. At the end of the procedure we  ensured that the testicular contents were in place.  Patient tolerated the procedure well. There were no complications. He was taken to the recovery room in  stable condition.               Curtis Bigiego [name], MD, FACS

## 2016-11-02 NOTE — Anesthesia Post-op Follow-up Note (Cosign Needed)
Anesthesia QCDR form completed.        

## 2016-11-02 NOTE — Transfer of Care (Signed)
Immediate Anesthesia Transfer of Care Note  Patient: Curtis [name]  Procedure(s) Performed: Procedure(s): HERNIA REPAIR INGUINAL INCARCERATED (Right)  Patient Location: PACU  Anesthesia Type:General  Level of Consciousness: sedated  Airway & Oxygen Therapy: Patient connected to face mask oxygen  Post-op Assessment: Post -op Vital signs reviewed and stable  Post vital signs: stable  Last Vitals:  Vitals:   11/01/16 2331 11/02/16 0127  BP:  (!) 155/76  Pulse: 91 86  Resp: 15 18  Temp:  (!) 36.2 C    Last Pain:  Vitals:   11/02/16 0127  TempSrc: Temporal  PainSc:          Complications: No apparent anesthesia complications

## 2016-11-03 MED ORDER — OXYCODONE HCL 5 MG PO TABS: 5.0000 mg | ORAL_TABLET | ORAL | 0 refills | Status: DC | PRN

## 2016-11-03 NOTE — Progress Notes (Signed)
Foley catheter removed , will monitor voiding

## 2016-11-03 NOTE — Progress Notes (Signed)
Discharge instruction provided to patient, IV removed. Patient discharge home as per order.

## 2016-11-03 NOTE — Discharge Instructions (Signed)
In addition to included general post-operative instructions for Open Primary Repair of Right Inguinal Hernia (without mesh),  Diet: Resume home heart healthy diet. Abstain from alcohol consumption.  Activity: No heavy lifting >15 pounds (children, pets, laundry, garbage) or strenuous activity until follow-up, but light activity and walking are encouraged. Do not drive or drink alcohol if taking narcotic pain medications.  Wound care: Remove dressing Sunday, 7/22 or Monday, 7/23. Once dressing removed, may shower/get incision wet with soapy water and pat dry (do not rub incisions), but no baths or submerging incision underwater until follow-up.   Medications: Resume all home medications. For mild to moderate pain: acetaminophen (Tylenol) or ibuprofen (if no kidney disease). Combining Tylenol with alcohol can substantially increase your risk of causing liver disease. Narcotic pain medications, if prescribed, can be used for severe pain, though may cause nausea, constipation, and drowsiness. Do not combine Tylenol and Percocet within a 6 hour period as Percocet contains Tylenol. If you do not need the narcotic pain medication, you do not need to fill the prescription.  Call office [phone]) at any time if any questions, worsening pain, fevers/chills, bleeding, drainage from incision site, or other concerns.

## 2016-11-03 NOTE — Progress Notes (Signed)
JP drain removed by MD this am dressing dry and intact

## 2016-11-03 NOTE — Discharge Summary (Signed)
Physician Discharge Summary  Patient ID: Curtis [name] MRN: [id number] DOB/AGE: [birth year] 63 y.o.  Admit date: 11/01/2016 Discharge date: 11/03/2016  Admission Diagnoses:  Discharge Diagnoses:  Active Problems:   Incarcerated inguinal hernia   Incarcerated hernia   Malnutrition of moderate degree   Discharged Condition: good  Hospital Course: 61 year old Male with chronic alcohol dependency/abuse (3 x 40 oz daily) and chronic previously spontaneously reducing Right inguinal hernia presented to [facility] ED with worsening RLQ abdominal pain and vomiting. Abdominal x-ray was concerning for obstruction, and incarcerated Right inguinal hernia was present on exam. Informed consent was obtained, and open primary repair of incarcerated Right inguinal hernia (containing terminal ileum and cecum at time of surgery) was performed as described in operative report by Dr. Everlene FarrierPabon (7/20). Post-operatively, patient was prescribed CIWA protocol for alcohol withdrawal, and his recovery was otherwise uneventful. Patient's pain remained minimal and well-controlled with ongoing +flatus and +BM with <100 mL serosanguinous fluid accumulating in his drain over 24 hours. Accordingly, drain was able to be removed, and discharge planning was initiated. Patient was then safely able to be discharged to his daughter's home and care with appropriate discharge instructions, follow-up, and pain medication. Cessation of alcohol abuse/consumption and smoking were also strongly encouraged and discussed at length.  Consults: None  Significant Diagnostic Studies: radiology: X-Ray: Dilated loop of small bowel in mid-abdomen, concerning for obstruction  Treatments: IV hydration and surgery: Open primary (Shouldice) repair of incarcerated Right inguinal hernia (without mesh)  Discharge Exam: Blood pressure 113/62, pulse 64, temperature 97.8 F (36.6 C), temperature source Oral, resp. rate 20, height 5\' 7"  (1.702 m), weight 165 lb (74.8  kg), SpO2 100 %. General appearance: alert, cooperative and no distress GI: soft and non-distended with minimal Right groin peri-incisional tenderness to palpation, incision well-approximated with no erythema or drainage  Disposition: 01-Home or Self Care   Allergies as of 11/03/2016   No Known Allergies     Medication List    TAKE these medications   chlordiazePOXIDE 5 MG capsule Commonly known as:  LIBRIUM Take 1 capsule (5 mg total) by mouth 3 (three) times daily.   folic acid 1 MG tablet Commonly known as:  FOLVITE Take 1 tablet (1 mg total) by mouth daily.   multivitamin with minerals Tabs tablet Take 1 tablet by mouth daily.   oxyCODONE 5 MG immediate release tablet Commonly known as:  Oxy IR/ROXICODONE Take 1-2 tablets (5-10 mg total) by mouth every 4 (four) hours as needed for moderate pain.   thiamine 100 MG tablet Take 1 tablet (100 mg total) by mouth daily.      Follow-up Information    Pabon, HawaiiDiego F, MD. Schedule an appointment as soon as possible for a visit in 2 week(s).   Specialty:  General Surgery Contact information: [address] [place] [address] [phone]           Signed: Ancil LinseyJason Evan Davis 11/03/2016, 4:53 PM

## 2016-11-05 NOTE — Telephone Encounter (Signed)
Post discharge call to patient made at this time. Pain is controlled at this time. No questions or concerns.   Confirmed patient appointment scheduled. Encouraged patient to call with any questions that arise prior to appointment. 

## 2016-11-06 LAB — SURGICAL PATHOLOGY

## 2016-11-07 NOTE — Telephone Encounter (Signed)
error 

## 2016-11-08 LAB — HIV ANTIBODY (ROUTINE TESTING W REFLEX): HIV SCREEN 4TH GENERATION: NONREACTIVE

## 2017-05-14 ENCOUNTER — Encounter: Payer: Self-pay | Admitting: *Deleted

## 2017-05-14 ENCOUNTER — Emergency Department
Admission: EM | Admit: 2017-05-14 | Discharge: 2017-05-16 | Disposition: A | Discharge status: Home / Self Care | Payer: Self-pay | Attending: Emergency Medicine | Admitting: Emergency Medicine

## 2017-05-14 ENCOUNTER — Other Ambulatory Visit: Payer: Self-pay

## 2017-05-14 ENCOUNTER — Emergency Department: Payer: Self-pay

## 2017-05-14 DIAGNOSIS — F10230 Alcohol dependence with withdrawal, uncomplicated: Secondary | ICD-10-CM | POA: Insufficient documentation

## 2017-05-14 DIAGNOSIS — F17210 Nicotine dependence, cigarettes, uncomplicated: Secondary | ICD-10-CM | POA: Insufficient documentation

## 2017-05-14 DIAGNOSIS — Z79899 Other long term (current) drug therapy: Secondary | ICD-10-CM | POA: Insufficient documentation

## 2017-05-14 DIAGNOSIS — R569 Unspecified convulsions: Secondary | ICD-10-CM | POA: Insufficient documentation

## 2017-05-14 LAB — CBC WITH DIFFERENTIAL/PLATELET
BASOS ABS: 0 10*3/uL (ref 0–0.1)
BASOS PCT: 1 %
EOS ABS: 0.1 10*3/uL (ref 0–0.7)
Eosinophils Relative: 2 %
HEMATOCRIT: 41.1 % (ref 40.0–52.0)
HEMOGLOBIN: 13.9 g/dL (ref 13.0–18.0)
Lymphocytes Relative: 25 %
Lymphs Abs: 1 10*3/uL (ref 1.0–3.6)
MCH: 33.5 pg (ref 26.0–34.0)
MCHC: 34 g/dL (ref 32.0–36.0)
MCV: 98.7 fL (ref 80.0–100.0)
Monocytes Absolute: 0.5 10*3/uL (ref 0.2–1.0)
Monocytes Relative: 11 %
NEUTROS ABS: 2.5 10*3/uL (ref 1.4–6.5)
Neutrophils Relative %: 61 %
Platelets: 96 10*3/uL — ABNORMAL LOW (ref 150–440)
RBC: 4.16 MIL/uL — ABNORMAL LOW (ref 4.40–5.90)
RDW: 14.6 % — AB (ref 11.5–14.5)
WBC: 4.1 10*3/uL (ref 3.8–10.6)

## 2017-05-14 LAB — BASIC METABOLIC PANEL
ANION GAP: 11 (ref 5–15)
BUN: 9 mg/dL (ref 6–20)
CALCIUM: 8.9 mg/dL (ref 8.9–10.3)
CO2: 20 mmol/L — ABNORMAL LOW (ref 22–32)
CREATININE: 0.78 mg/dL (ref 0.61–1.24)
Chloride: 98 mmol/L — ABNORMAL LOW (ref 101–111)
Glucose, Bld: 177 mg/dL — ABNORMAL HIGH (ref 65–99)
Potassium: 3.8 mmol/L (ref 3.5–5.1)
SODIUM: 129 mmol/L — AB (ref 135–145)

## 2017-05-14 LAB — ETHANOL

## 2017-05-14 MED ORDER — SODIUM CHLORIDE 0.9 % IV BOLUS (SEPSIS)
1000.0000 mL | Freq: Once | INTRAVENOUS | Status: AC
  Administered 2017-05-14: 1000 mL via INTRAVENOUS

## 2017-05-14 MED ORDER — CHLORDIAZEPOXIDE HCL 25 MG PO CAPS
25.0000 mg | ORAL_CAPSULE | Freq: Once | ORAL | Status: AC
  Administered 2017-05-14: 25 mg via ORAL
  Filled 2017-05-14: qty 1

## 2017-05-14 NOTE — ED Triage Notes (Addendum)
Pt to ED from home after family reported to EMS they had witnessed a seizure this lasting approx 5 minutes. Family reported pt stared into space and then began having full body jerking. PT has hx of 4 seizures over the past year but to EMS knowledge does not take medication and has not been seen by a neurologist. Pt presents to ED able to follow commands and alert but slow to respond. Pt is shaking currently with fine tremors.   No lose of bowels and no tongue trauma reported.

## 2017-05-14 NOTE — ED Notes (Signed)
Pt reports to this RN that he usually drinks 7-8 beers a day and when asked if he drank today pt reports he does not remember the day.

## 2017-05-14 NOTE — ED Provider Notes (Signed)
[facility] Emergency Department Provider Note ____________________________________________   First MD Initiated Contact with Patient 05/14/17 2237     (approximate)  I have reviewed the triage vital signs and the nursing notes.   HISTORY  Chief Complaint Seizures    HPI Curtis [name] is a 62 y.o. male with past medical history of alcohol abuse, alcohol withdrawal, as well as seizures, with other PMH as noted below, who presents with an apparent seizure.  It was witnessed by the family.  It was described as starting with staring into space, and then full body jerking lasting a few minutes.  The patient states that he remembers earlier today, but does not remember anything leading up to this event.  He states he now feels like his usual self.  Patient states that he does think he has had seizures before, but he denies having had alcohol withdrawal before.  He states that he drinks daily, but does not remember if he drank today.  He states he does not take any medications, and does not see a doctor regularly.  Past Medical History:  Diagnosis Date  . EtOH dependence [facility](HCC)     Patient Active Problem List   Diagnosis Date Noted  . Incarcerated hernia 11/02/2016  . Malnutrition of moderate degree 11/02/2016  . Incarcerated inguinal hernia   . SBO (small bowel obstruction) (HCC)   . Incarcerated right inguinal hernia   . Seizures (HCC) 06/26/2016  . Non-recurrent unilateral inguinal hernia without obstruction or gangrene   . Alcohol withdrawal (HCC) 06/21/2016    Past Surgical History:  Procedure Laterality Date  . INGUINAL HERNIA REPAIR Right 11/01/2016   Procedure: HERNIA REPAIR INGUINAL INCARCERATED;  Surgeon: Leafy RoPabon, Diego F, MD;  Location: [facility] ORS;  Service: General;  Laterality: Right;  . NO PAST SURGERIES      Prior to Admission medications   Medication Sig Start Date End Date Taking? Authorizing Provider  chlordiazePOXIDE (LIBRIUM) 5 MG capsule  Take 1 capsule (5 mg total) by mouth 3 (three) times daily. Patient not taking: Reported on 11/02/2016 06/28/16   Milagros LollSudini, Srikar, MD  folic acid (FOLVITE) 1 MG tablet Take 1 tablet (1 mg total) by mouth daily. Patient not taking: Reported on 11/02/2016 06/25/16   Shaune Pollackhen, Qing, MD  Multiple Vitamin (MULTIVITAMIN WITH MINERALS) TABS tablet Take 1 tablet by mouth daily. Patient not taking: Reported on 11/02/2016 06/25/16   Shaune Pollackhen, Qing, MD  oxyCODONE (OXY IR/ROXICODONE) 5 MG immediate release tablet Take 1-2 tablets (5-10 mg total) by mouth every 4 (four) hours as needed for moderate pain. 11/03/16   Ancil Linseyavis, Jason Evan, MD  thiamine 100 MG tablet Take 1 tablet (100 mg total) by mouth daily. Patient not taking: Reported on 11/02/2016 06/25/16   Shaune Pollackhen, Qing, MD    Allergies Patient has no known allergies.  Family History  Family history unknown: Yes    Social History Social History   Tobacco Use  . Smoking status: Current Every Day Smoker    Packs/day: 1.00    Types: Cigarettes  . Smokeless tobacco: Current User    Types: Chew  Substance Use Topics  . Alcohol use: Yes    Alcohol/week: 75.6 oz    Types: 126 Cans of beer per week    Comment: pt repors he drinks 5 or 6 beers a day.   . Drug use: No    Review of Systems  Constitutional: No fever.  Eyes: No visual changes. ENT: No sore throat. Cardiovascular: Denies chest pain.  Respiratory: Denies shortness of breath. Gastrointestinal: No nausea, no vomiting.  No diarrhea.  Genitourinary: Negative for dysuria.  Musculoskeletal: Negative for back pain. Skin: Negative for rash. Neurological: Negative for headaches, focal weakness or numbness.   ____________________________________________   PHYSICAL EXAM:  VITAL SIGNS: ED Triage Vitals  Enc Vitals Group     BP 05/14/17 2228 (!) 159/77     Pulse Rate 05/14/17 2228 96     Resp 05/14/17 2228 (!) 29     Temp 05/14/17 2228 98.2 F (36.8 C)     Temp Source 05/14/17 2228 Oral     SpO2  05/14/17 2228 98 %     Weight 05/14/17 2229 163 lb 2.3 oz (74 kg)     Height --      Head Circumference --      Peak Flow --      Pain Score --      Pain Loc --      Pain Edu? --      Excl. in GC? --     Constitutional: Alert and oriented.  Relatively comfortable appearing and in no acute distress. Eyes: Conjunctivae are normal.  EOMI.  PERRLA. Head: Atraumatic. Nose: No congestion/rhinnorhea. Mouth/Throat: Mucous membranes are moist.   Neck: Normal range of motion.  Cardiovascular: Normal rate, regular rhythm. Grossly normal heart sounds.  Good peripheral circulation. Respiratory: Normal respiratory effort.  No retractions. Lungs CTAB. Gastrointestinal: Soft and nontender. No distention.  Genitourinary: No flank tenderness. Musculoskeletal: No lower extremity edema.  Extremities warm and well perfused.  Neurologic:  Normal speech and language.  Motor and sensory intact in all extremities.  Normal coordination with no ataxia.  Moderate bilateral tremor, which patient states is chronic.  No tongue fasciculation. Skin:  Skin is warm and dry. No rash noted. Psychiatric: Mood and affect are normal. Speech and behavior are normal.  ____________________________________________   LABS (all labs ordered are listed, but only abnormal results are displayed)  Labs Reviewed  BASIC METABOLIC PANEL - Abnormal; Notable for the following components:      Result Value   Sodium 129 (*)    Chloride 98 (*)    CO2 20 (*)    Glucose, Bld 177 (*)    All other components within normal limits  CBC WITH DIFFERENTIAL/PLATELET - Abnormal; Notable for the following components:   RBC 4.16 (*)    RDW 14.6 (*)    Platelets 96 (*)    All other components within normal limits  ETHANOL   ____________________________________________  EKG   ____________________________________________  RADIOLOGY  CT head: No ICH or other acute  abnormalities  ____________________________________________   PROCEDURES  Procedure(s) performed: No  Procedures  Critical Care performed: No ____________________________________________   INITIAL IMPRESSION / ASSESSMENT AND PLAN / ED COURSE  Pertinent labs & imaging results that were available during my care of the patient were reviewed by me and considered in my medical decision making (see chart for details).  62 year old male with history of alcohol abuse and prior history of seizures presents after an apparent seizure, witnessed by family members.  Patient currently is alert and is without specific complaints.  He states he does not know if he drank today, but states that he drinks almost every day.  He denies having a history of alcohol withdrawal.  I reviewed the past medical records in Epic; the patient was admitted twice in March of last year with seizure and likely alcohol withdrawal.  He has no other ED visits pertinent  to the current presentation.  On exam, the vital signs are normal except for mild hypertension, and the patient has a tremor which she states is chronic.  He has no tongue fasciculation, and he is alert and appropriate.  The remainder of the neuro exam is nonfocal.  He has no visible trauma.  Presentation is consistent with likely generalized seizure; it is unclear whether this could be related to alcohol withdrawal versus an underlying seizure disorder.  Plan: CT head, basic labs, and reassess.  At this time, despite the tremor, based on patient's vital signs, other exam findings, and mental status, there is no evidence of active alcohol withdrawal.  If the workup is negative, and patient remained stable with no signs of developing withdrawal, it is possible that he may be discharged home.  Given that the patient is generally noncompliant with outpatient follow-up, and has no primary care doctor, and does not take medications, there is no indication to start an  antiepileptic from the ED.  We will reassess dispo after the workup is complete.  ----------------------------------------- 11:31 PM on 05/14/2017 -----------------------------------------  Patient CT is negative.  Awaiting lab workup.  I signed the patient out to the oncoming physician Dr. Manson Passey.  Plan to reassess, and if patient continues to have no signs of active withdrawal and no further seizure-like episodes, possible discharge home.  ____________________________________________   FINAL CLINICAL IMPRESSION(S) / ED DIAGNOSES  Final diagnoses:  Seizure (HCC)      NEW MEDICATIONS STARTED DURING THIS VISIT:  New Prescriptions   No medications on file     Note:  This document was prepared using Dragon voice recognition software and may include unintentional dictation errors.    Dionne Bucy, MD 05/14/17 2332

## 2017-05-14 NOTE — ED Notes (Signed)
Seizure pads placed on bed rails and rails are both up. Pt is in eye sight of RN.

## 2017-05-14 NOTE — ED Notes (Signed)
Patient transported to CT 

## 2017-05-14 NOTE — ED Notes (Signed)
MD at bedside to evaluate pt.

## 2017-05-15 MED ORDER — LORAZEPAM 2 MG/ML IJ SOLN: 0.0000 mg | Freq: Two times a day (BID) | INTRAMUSCULAR | Status: DC

## 2017-05-15 MED ORDER — LORAZEPAM 2 MG PO TABS: 0.0000 mg | ORAL_TABLET | Freq: Four times a day (QID) | ORAL | Status: DC

## 2017-05-15 MED ORDER — LORAZEPAM 2 MG PO TABS: 0.0000 mg | ORAL_TABLET | Freq: Two times a day (BID) | ORAL | Status: DC

## 2017-05-15 MED ORDER — CHLORDIAZEPOXIDE HCL 25 MG PO CAPS
50.0000 mg | ORAL_CAPSULE | Freq: Once | ORAL | Status: AC
  Administered 2017-05-16: 50 mg via ORAL
  Filled 2017-05-15: qty 2

## 2017-05-15 MED ORDER — LORAZEPAM 2 MG/ML IJ SOLN
1.0000 mg | Freq: Once | INTRAMUSCULAR | Status: AC
  Administered 2017-05-15: 1 mg via INTRAVENOUS
  Filled 2017-05-15: qty 1

## 2017-05-15 MED ORDER — THIAMINE HCL 100 MG/ML IJ SOLN: 100.0000 mg | Freq: Every day | INTRAMUSCULAR | Status: DC

## 2017-05-15 MED ORDER — LORAZEPAM 2 MG/ML IJ SOLN
INTRAMUSCULAR | Status: AC
  Administered 2017-05-15: 1 mg via INTRAVENOUS
  Filled 2017-05-15: qty 1

## 2017-05-15 MED ORDER — LORAZEPAM 2 MG/ML IJ SOLN
0.0000 mg | Freq: Four times a day (QID) | INTRAMUSCULAR | Status: DC
  Administered 2017-05-15 (×2): 1 mg via INTRAVENOUS
  Filled 2017-05-15 (×2): qty 1

## 2017-05-15 MED ORDER — LORAZEPAM 2 MG/ML IJ SOLN
1.0000 mg | Freq: Once | INTRAMUSCULAR | Status: AC
  Administered 2017-05-15: 1 mg via INTRAVENOUS

## 2017-05-15 MED ORDER — VITAMIN B-1 100 MG PO TABS
100.0000 mg | ORAL_TABLET | Freq: Every day | ORAL | Status: DC
  Administered 2017-05-15: 100 mg via ORAL
  Filled 2017-05-15: qty 1

## 2017-05-15 NOTE — Discharge Instructions (Addendum)
Please follow-up at [facility] for outpatient treatment of your alcoholism.  Return to the emergency department sooner for any concerns.  It was a pleasure to take care of you today, and thank you for coming to our emergency department.  If you have any questions or concerns before leaving please ask the nurse to grab me and I'm more than happy to go through your aftercare instructions again.  If you were prescribed any opioid pain medication today such as Norco, Vicodin, Percocet, morphine, hydrocodone, or oxycodone please make sure you do not drive when you are taking this medication as it can alter your ability to drive safely.  If you have any concerns once you are home that you are not improving or are in fact getting worse before you can make it to your follow-up appointment, please do not hesitate to call 911 and come back for further evaluation.  Merrily Brittle, MD  Results for orders placed or performed during the hospital encounter of 05/14/17  Ethanol  Result Value Ref Range   Alcohol, Ethyl (B) <10 <10 mg/dL  Basic metabolic panel  Result Value Ref Range   Sodium 129 (L) 135 - 145 mmol/L   Potassium 3.8 3.5 - 5.1 mmol/L   Chloride 98 (L) 101 - 111 mmol/L   CO2 20 (L) 22 - 32 mmol/L   Glucose, Bld 177 (H) 65 - 99 mg/dL   BUN 9 6 - 20 mg/dL   Creatinine, Ser 1.61 0.61 - 1.24 mg/dL   Calcium 8.9 8.9 - 09.6 mg/dL   GFR calc non Af Amer >60 >60 mL/min   GFR calc Af Amer >60 >60 mL/min   Anion gap 11 5 - 15  CBC with Differential  Result Value Ref Range   WBC 4.1 3.8 - 10.6 K/uL   RBC 4.16 (L) 4.40 - 5.90 MIL/uL   Hemoglobin 13.9 13.0 - 18.0 g/dL   HCT 04.5 40.9 - 81.1 %   MCV 98.7 80.0 - 100.0 fL   MCH 33.5 26.0 - 34.0 pg   MCHC 34.0 32.0 - 36.0 g/dL   RDW 91.4 (H) 78.2 - 95.6 %   Platelets 96 (L) 150 - 440 K/uL   Neutrophils Relative % 61 %   Neutro Abs 2.5 1.4 - 6.5 K/uL   Lymphocytes Relative 25 %   Lymphs Abs 1.0 1.0 - 3.6 K/uL   Monocytes Relative 11 %   Monocytes  Absolute 0.5 0.2 - 1.0 K/uL   Eosinophils Relative 2 %   Eosinophils Absolute 0.1 0 - 0.7 K/uL   Basophils Relative 1 %   Basophils Absolute 0.0 0 - 0.1 K/uL   Ct Head Wo Contrast  Result Date: 05/14/2017 CLINICAL DATA:  Witness seizure. History of for seizures over the past year. EXAM: CT HEAD WITHOUT CONTRAST TECHNIQUE: Contiguous axial images were obtained from the base of the skull through the vertex without intravenous contrast. COMPARISON:  MRI brain 06/27/2016.  CT head 06/26/2016. FINDINGS: Brain: Mild diffuse cerebral atrophy. Mild ventricular dilatation consistent with central atrophy. No mass effect or midline shift. No abnormal extra-axial fluid collections. Gray-white matter junctions are distinct. Basal cisterns are not effaced. No acute intracranial hemorrhage. Vascular: Intracranial arterial vascular calcifications are present. Skull: Calvarium appears intact. Sinuses/Orbits: Old fracture deformity of the left medial orbital wall. Mucosal thickening throughout the paranasal sinuses. No acute air-fluid levels. Mastoid air cells are clear. Other: None. IMPRESSION: No acute intracranial abnormalities. Chronic atrophy. Old fracture deformity of the left medial orbital wall. Chronic  inflammatory changes in the paranasal sinuses. Electronically Signed   By: Burman NievesWilliam  Stevens M.D.   On: 05/14/2017 23:22

## 2017-05-15 NOTE — ED Provider Notes (Signed)
The patient expresses no desire to stop drinking alcohol.  I discussed with him that if he is not ready to stop drinking rehabilitation would not provide any benefit and he agrees and would like to go home.  He is currently mildly tremulous.  We will give a single dose of chlordiazepoxide now and discharge home.   Merrily Brittleifenbark, Avrohom Mckelvin, MD 05/15/17 [phone]

## 2017-05-15 NOTE — BH Assessment (Signed)
Faxed pt's referral to [facility] [phone]) for review and evaluation. Awaiting  call back.

## 2017-05-15 NOTE — ED Notes (Signed)
Dr Pershing ProudSchaevitz would like pt moved to the [facility] while holding for bed placement at alcohol rehab facility - charge nurse notified and advised this nurse to call [facility] - called [facility] and they states that they would review the chart and return the call

## 2017-05-15 NOTE — ED Notes (Signed)
Pt urinated and had a BM.

## 2017-05-15 NOTE — ED Notes (Signed)
Spoke with [facility], [facility], however he would have to be self pay. Pt states he does not have mean to pay for rehab. [facility] made aware and states they will try another facility.

## 2017-05-15 NOTE — ED Notes (Signed)
Pt assisted to toilet to void 

## 2017-05-15 NOTE — ED Notes (Signed)
Pt given lunch tray and coke to drink.  ?

## 2017-05-15 NOTE — ED Notes (Signed)
Pt given coke to drink 

## 2017-05-15 NOTE — ED Notes (Signed)
Pt given cereal and milk.  

## 2017-05-15 NOTE — ED Notes (Signed)
[facility] states that they are still working on placement for this pt

## 2017-05-15 NOTE — BH Assessment (Signed)
Assessment Note  Curtis [name] is an 62 y.o. male who presents to the ED via EMS. Family reports the pt had a seizure, lasting more than five minutes prior to coming into the ED this evening. 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 The pt reports living in the home with his daughter. Per the pt's nurse the daughter called and reported that the pt drinks 4-5 40oz beers daily but only has two today.  Upon observation of the pt he was trembling and shaking, smelled of alcohol, and had an unkept appearance. When asked by this writer how many beers he drinks daily the pt responded "just two". The pt was a poor historian not remembering much of the day before coming to the ED. He reports not having much of an appetite resulting in the loss of 20-30 lbs over time. He reports being able to sleep well at night and being able to complete his ADL on his own without assistance from others.   Pt denies SI/HI A/V H  Diagnosis: EtOH dependence   Past Medical History:  Past Medical History:  Diagnosis Date  . EtOH dependence [facility])     Past Surgical History:  Procedure Laterality Date  . INGUINAL HERNIA REPAIR Right 11/01/2016   Procedure: HERNIA REPAIR INGUINAL INCARCERATED;  Surgeon: Leafy Ro, MD;  Location: [facility] ORS;  Service: General;  Laterality: Right;  . NO PAST SURGERIES      Family History:  Family History  Family history unknown: Yes    Social History:  reports that he has been smoking cigarettes.  He has been smoking about 1.00 pack per day. His smokeless tobacco use includes chew. He reports that he drinks about 75.6 oz of alcohol per week. He reports that he does not use drugs.  Additional Social History:  Alcohol / Drug Use Pain Medications: See MAR Prescriptions: See MAR Over the Counter: See MAR History of alcohol / drug use?: Yes Withdrawal Symptoms: Nausea / Vomiting, Diarrhea, Seizures, Weakness, Tremors Date of most recent  seizure: 05/14/2017 Substance #1 Name of Substance 1: Alcohol 1 - Age of First Use: 14 1 - Amount (size/oz): 7-8 40 oz beers 1 - Frequency: daily 1 - Last Use / Amount: 2 40 oz beers today  CIWA: CIWA-Ar BP: 134/89 Pulse Rate: 85 Nausea and Vomiting: no nausea and no vomiting Tactile Disturbances: none Tremor: not visible, but can be felt fingertip to fingertip Auditory Disturbances: not present Paroxysmal Sweats: no sweat visible Visual Disturbances: not present Anxiety: no anxiety, at ease Headache, Fullness in Head: none present Agitation: normal activity Orientation and Clouding of Sensorium: cannot do serial additions or is uncertain about date CIWA-Ar Total: 2 COWS:    Allergies: No Known Allergies  Home Medications:  (Not in a hospital admission)  OB/GYN Status:  No LMP for male patient.  General Assessment Data Location of Assessment: [facility] ED TTS Assessment: In system Is this a Tele or Face-to-Face Assessment?: Face-to-Face Is this an Initial Assessment or a Re-assessment for this encounter?: Initial Assessment Marital status: Single Maiden name: . Is patient pregnant?: No Pregnancy Status: No Living Arrangements: Other relatives(pt reports living with daughter) Can pt return to current living arrangement?: Yes Admission Status: Voluntary Is patient capable of signing voluntary admission?: Yes Referral Source: Self/Family/Friend Insurance type: none  Medical Screening Exam [facility] Walk-in ONLY) Medical Exam completed: Yes  Crisis Care Plan Living Arrangements: Other relatives(pt reports living with daughter) Legal  Guardian: Other:(Self) Name of Psychiatrist: n/a Name of Therapist: n/a  Education Status Is patient currently in school?: No  Risk to self with the past 6 months Suicidal Ideation: No Has patient been a risk to self within the past 6 months prior to admission? : No Suicidal Intent: No Has patient had any suicidal intent within the past 6  months prior to admission? : No Is patient at risk for suicide?: No Suicidal Plan?: No Has patient had any suicidal plan within the past 6 months prior to admission? : No Access to Means: No What has been your use of drugs/alcohol within the last 12 months?: has been drinking EtOH since age 62 Previous Attempts/Gestures: No How many times?: 0 Other Self Harm Risks: 0 Triggers for Past Attempts: None known Intentional Self Injurious Behavior: None Family Suicide History: No Recent stressful life event(s): (unknown) Persecutory voices/beliefs?: No Depression: No Substance abuse history and/or treatment for substance abuse?: Yes Suicide prevention information given to non-admitted patients: Not applicable  Risk to Others within the past 6 months Homicidal Ideation: No Does patient have any lifetime risk of violence toward others beyond the six months prior to admission? : No Thoughts of Harm to Others: No Current Homicidal Intent: No Current Homicidal Plan: No Access to Homicidal Means: No History of harm to others?: No Assessment of Violence: None Noted Does patient have access to weapons?: No Criminal Charges Pending?: No Does patient have a court date: No Is patient on probation?: No  Psychosis Hallucinations: None noted Delusions: None noted  Mental Status Report Appearance/Hygiene: Body odor, Disheveled, Poor hygiene Eye Contact: Poor Motor Activity: Tremors, Unsteady Speech: Incoherent Level of Consciousness: Drowsy Mood: Other (Comment) Affect: Flat Anxiety Level: Minimal Thought Processes: Circumstantial Judgement: Impaired Orientation: Unable to assess Obsessive Compulsive Thoughts/Behaviors: Unable to Assess  Cognitive Functioning Concentration: Fair Memory: Recent Impaired, Remote Impaired IQ: Average Insight: Fair Impulse Control: Poor Appetite: Poor Weight Loss: 25 Weight Gain: 0 Sleep: No Change Total Hours of Sleep: 8 Vegetative Symptoms:  Unable to Assess     Prior Inpatient Therapy Prior Inpatient Therapy: No  Prior Outpatient Therapy Does patient have an ACCT team?: No Does patient have Intensive In-House Services?  : No Does patient have [facility] services? : No Does patient have P4CC services?: No  ADL Screening (condition at time of admission) Is the patient deaf or have difficulty hearing?: No Does the patient have difficulty seeing, even when wearing glasses/contacts?: No Does the patient have difficulty concentrating, remembering, or making decisions?: No Does the patient have difficulty dressing or bathing?: No Does the patient have difficulty walking or climbing stairs?: No(reports geting dizzy going up stairs) Weakness of Legs: None Weakness of Arms/Hands: None  Home Assistive Devices/Equipment Home Assistive Devices/Equipment: None  Therapy Consults (therapy consults require a physician order) PT Evaluation Needed: No OT Evalulation Needed: No SLP Evaluation Needed: No Abuse/Neglect Assessment (Assessment to be complete while patient is alone) Abuse/Neglect Assessment Can Be Completed: Yes Physical Abuse: Denies Verbal Abuse: Denies Sexual Abuse: Denies Exploitation of patient/patient's resources: Denies Self-Neglect: Denies Values / Beliefs Cultural Requests During Hospitalization: None Spiritual Requests During Hospitalization: None Consults Spiritual Care Consult Needed: No Social Work Consult Needed: No Merchant navy officerAdvance Directives (For Healthcare) Does Patient Have a Medical Advance Directive?: No Would patient like information on creating a medical advance directive?: No - Patient declined    Additional Information 1:1 In Past 12 Months?: No CIRT Risk: No Elopement Risk: No Does patient have medical clearance?: Yes  Child/Adolescent  Assessment Running Away Risk: Denies Bed-Wetting: Denies Destruction of Property: Denies Cruelty to Animals: Denies Stealing: Denies Rebellious/Defies  Authority: Denies Satanic Involvement: Denies Archivist: Denies Problems at [employer]: Denies Gang Involvement: Denies  Disposition:  Disposition Initial Assessment Completed for this Encounter: Yes Disposition of Patient: Inpatient treatment program Type of inpatient treatment program: Adult  On Site Evaluation by:   Reviewed with Physician:    Anala Whisenant D Carla Rashad 05/15/2017 3:02 AM

## 2017-05-15 NOTE — BH Assessment (Signed)
This Clinical research associatewriter spoke with Curtis [name] at [facility] and she stated they only accept private insurance and private pay for substance abuse treatment, no crisis beds.

## 2017-05-15 NOTE — BH Assessment (Addendum)
Faxed pt's referral [facility]. Per Danice Goltzheo [phone]) they do have an available crisis bed, chart is being reviewed, and he will call with a decision this morning.  Faxed pt's referral packet to [facility] ([phone]) - pt denied, per [facility] staff she could not tell from the medical records if the seizure was due to the withdrawal or an underlying seizure disorder. Pt not medically fit for program.

## 2017-05-15 NOTE — BH Assessment (Signed)
Faxed pt's referral to [facility] [phone]) - waiting for response.  [facility] called and stated that the pt could come to their facility however, he must be notified that he will be considered self pay. Pt was notified and stated that he doesn't have the means to cover treatment.

## 2017-05-15 NOTE — BH Assessment (Addendum)
This Clinical research associatewriter contacted [facility] for inpatient SA treatment facilities and was provided with the following by Thayer Ohmhris:  [facility], [place] [phone]- left message for return call  [facility], [facility]- Per Ray, no available beds.   [facility]- Left HIPPA compliant message for return call for Curtis [name].   [facility]- Denied patient due to seizures, per TTS Camille.  [facility]- Denied patient due to no insurance and inability to self pay, per [facility].

## 2017-05-15 NOTE — ED Notes (Signed)
Daughter called seeking information on patient's plan of care. Patient verified that daughter could receive information. Daughter up dated. Phone given to patient to talk to his daughter.

## 2017-05-15 NOTE — ED Notes (Signed)
Pt given supper tray and juice 

## 2017-05-15 NOTE — ED Notes (Signed)
Awaiting TTS to return call about possible placement for rehab per [facility]

## 2017-05-16 NOTE — ED Notes (Signed)
[facility]. Pt taken to ED waiting room with first nurse to await arrival of transportation.

## 2019-11-01 IMAGING — CT CT HEAD W/O CM
3 of 6 series · 15 of 47 positions shown, 18 images · non-contrast
Comparison: MRI brain 06/27/2016.  CT head 06/26/2016.

CLINICAL DATA: Witness seizure. History of for seizures over the
past year.

EXAM:
CT HEAD WITHOUT CONTRAST
TECHNIQUE: Contiguous axial images were obtained from the base of the skull
through the vertex without intravenous contrast.

[Series 3: head wo · axial · 0.41mm/px · z∈[+335,+465]mm · 10 of 30 slices shown, 13 images]
[im 2/30  brain]
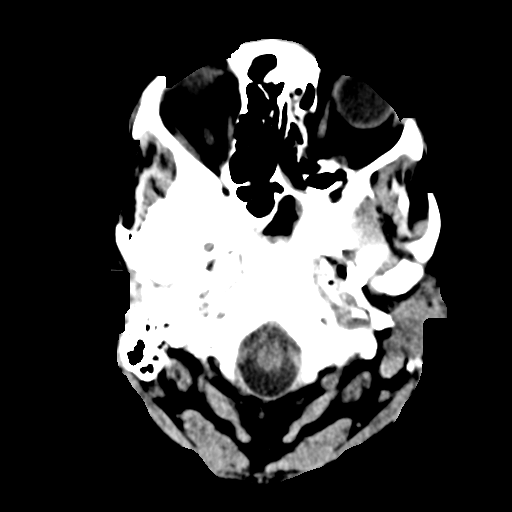
[im 2/30  bone]
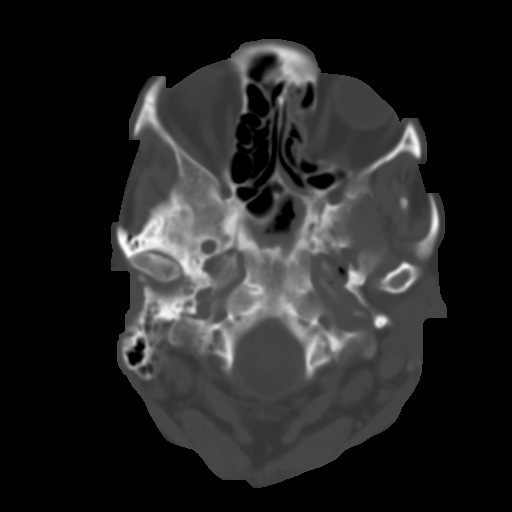
[im 5/30  brain]
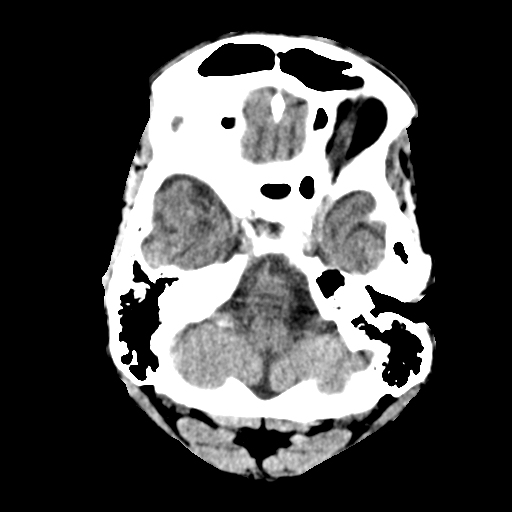
[im 8/30  brain]
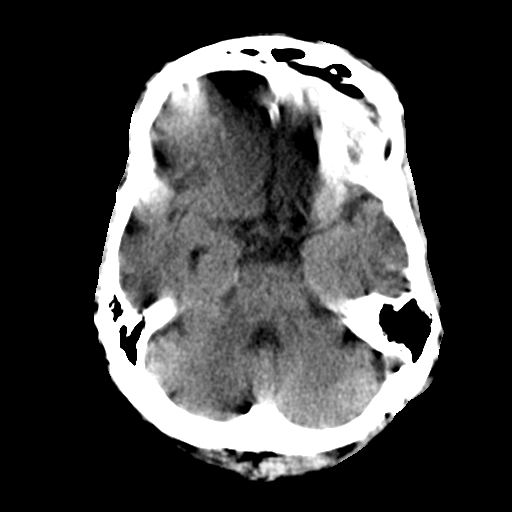
[im 11/30  brain]
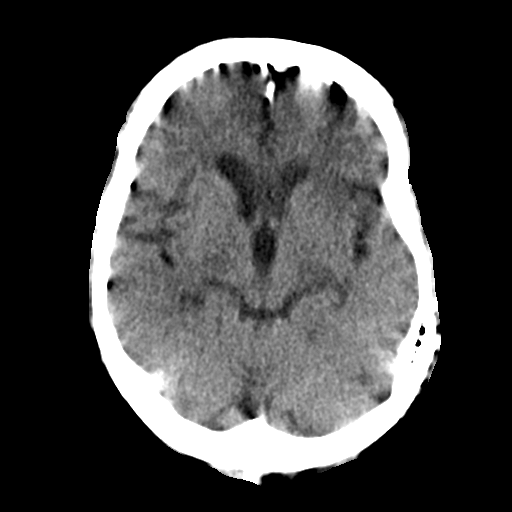
[im 14/30  brain]
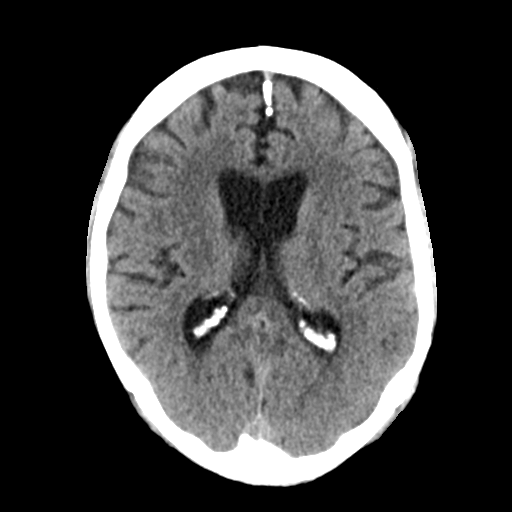
[im 14/30  bone]
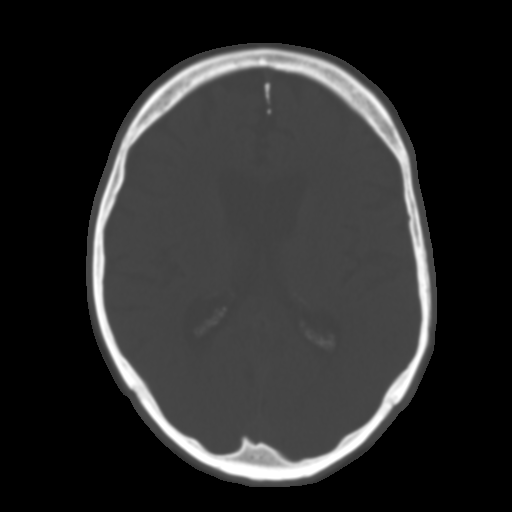
[im 16/30  brain]
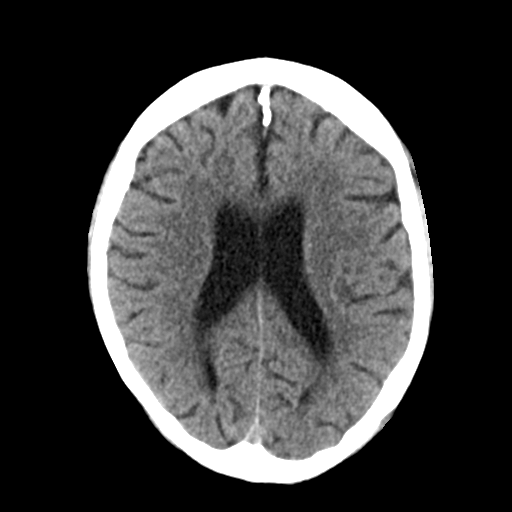
[im 19/30  brain]
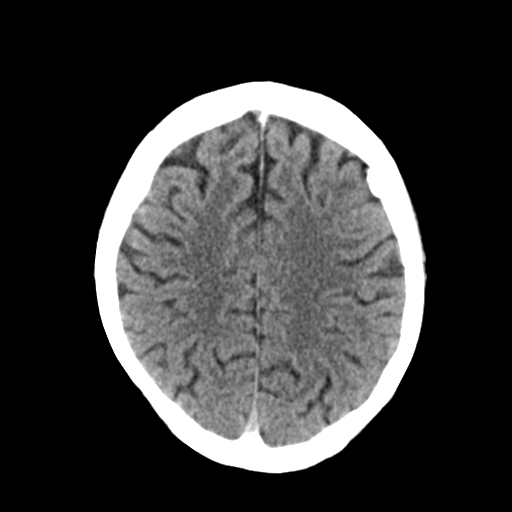
[im 22/30  brain]
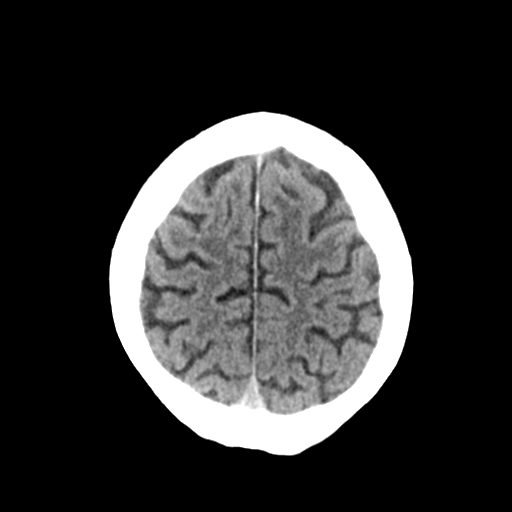
[im 25/30  brain]
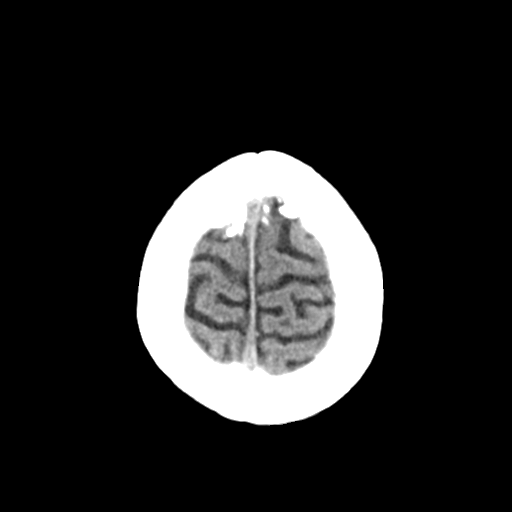
[im 25/30  bone]
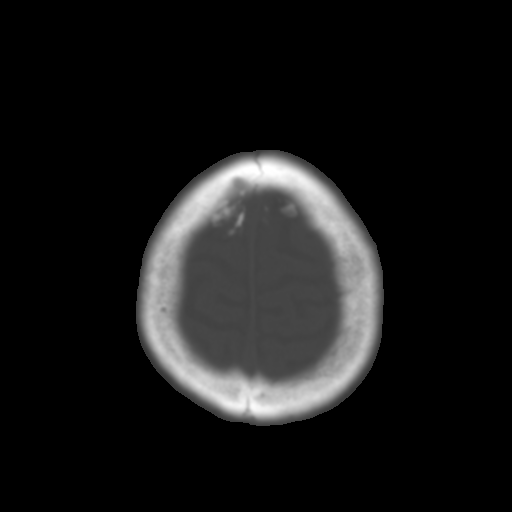
[im 28/30  brain]
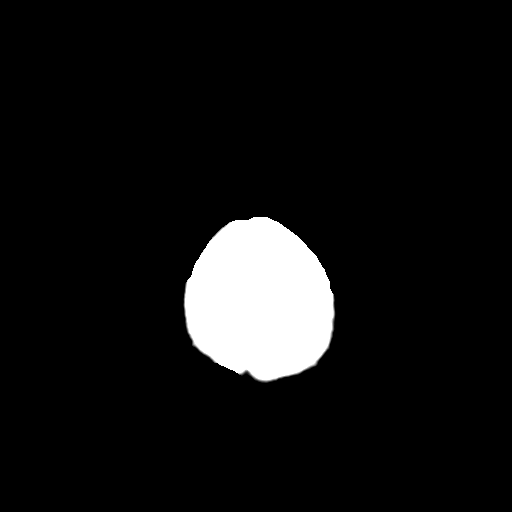

[Series 4: coronal soft tissue · coronal · 0.30mm/px · 3 of 64 slices shown]
[im 16/64  brain]
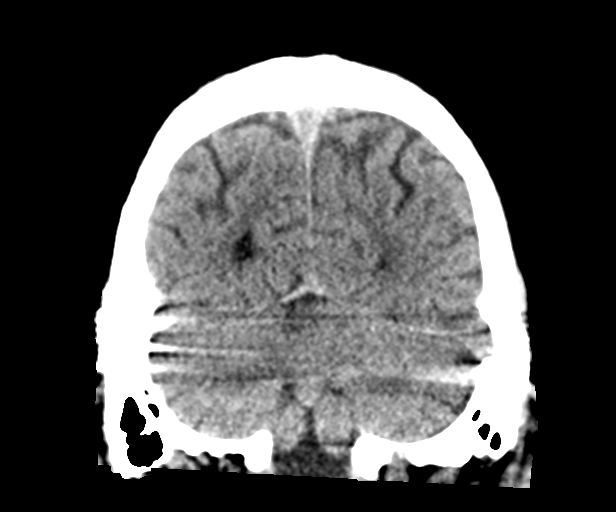
[im 32/64  brain]
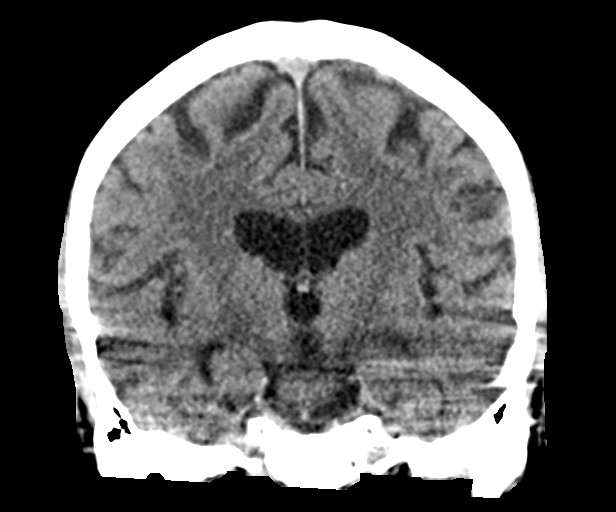
[im 48/64  brain]
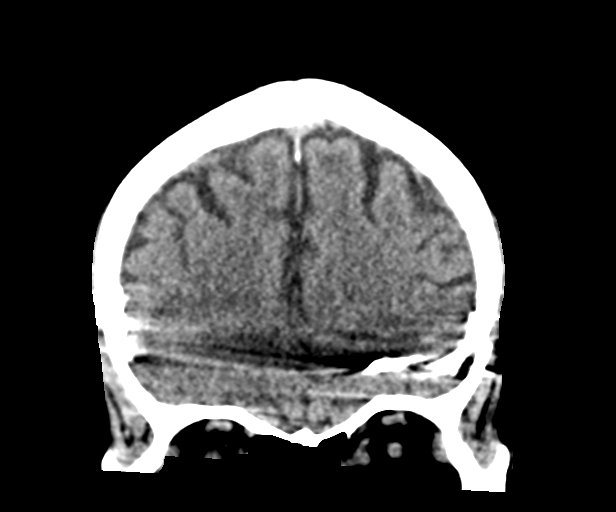

[Series 5: sagittal soft tissue · sagittal · 0.31mm/px · 2 of 52 slices shown]
[im 18/52  brain]
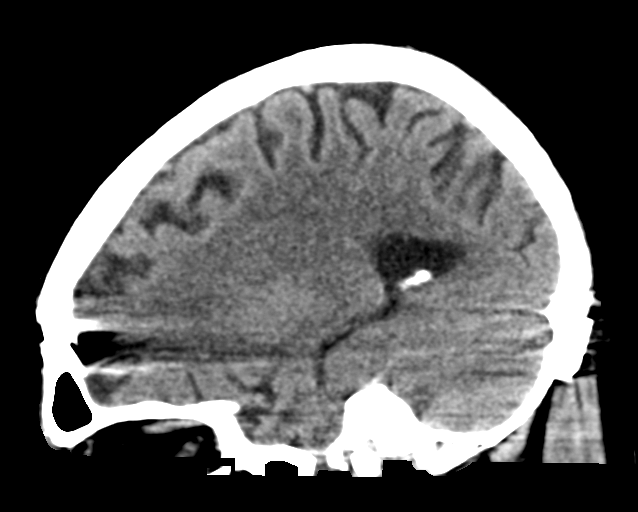
[im 35/52  brain]
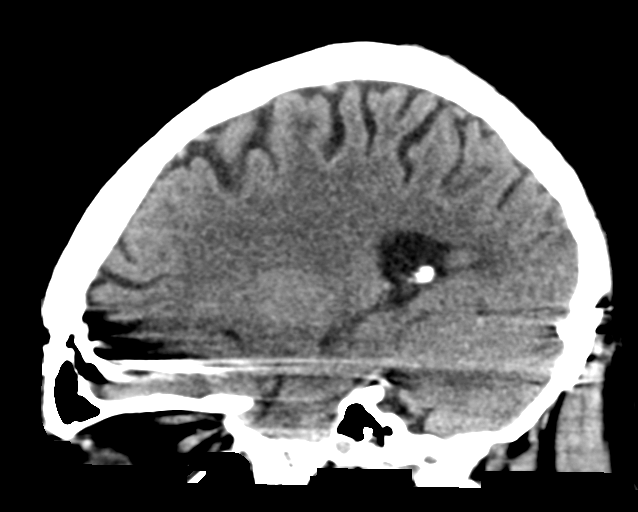

[15 of 47 positions shown; findings below may reference images not displayed]

FINDINGS: Brain: Mild diffuse cerebral atrophy. Mild ventricular dilatation
consistent with central atrophy. No mass effect or midline shift. No
abnormal extra-axial fluid collections. Gray-white matter junctions
are distinct. Basal cisterns are not effaced. No acute intracranial
hemorrhage.

Vascular: Intracranial arterial vascular calcifications are present.

Skull: Calvarium appears intact.

Sinuses/Orbits: Old fracture deformity of the left medial orbital
wall. Mucosal thickening throughout the paranasal sinuses. No acute
air-fluid levels. Mastoid air cells are clear.

Other: None.
IMPRESSION: No acute intracranial abnormalities. Chronic atrophy. Old fracture
deformity of the left medial orbital wall. Chronic inflammatory
changes in the paranasal sinuses.

## 2022-02-06 ENCOUNTER — Encounter: Payer: Self-pay | Admitting: Emergency Medicine

## 2022-02-06 ENCOUNTER — Ambulatory Visit
Admission: EM | Admit: 2022-02-06 | Discharge: 2022-02-06 | Disposition: A | Discharge status: Home / Self Care | Payer: Medicare Other | Attending: Physician Assistant | Admitting: Physician Assistant

## 2022-02-06 DIAGNOSIS — M546 Pain in thoracic spine: Secondary | ICD-10-CM | POA: Diagnosis not present

## 2022-02-06 MED ORDER — BACLOFEN 10 MG PO TABS: 10.0000 mg | ORAL_TABLET | Freq: Three times a day (TID) | ORAL | 0 refills | Status: DC | PRN

## 2022-02-06 MED ORDER — MELOXICAM 15 MG PO TABS: 15.0000 mg | ORAL_TABLET | Freq: Every day | ORAL | 0 refills | Status: AC | PRN

## 2022-02-06 NOTE — ED Triage Notes (Signed)
Pt c/o lower back pain. Started about 4 days ago. Pt states pain is worse when he lays down to go to sleep. Denies injury.

## 2022-02-06 NOTE — ED Provider Notes (Signed)
[facility]    CSN: [id number] Arrival date & time: 02/06/22  1115      History   Chief Complaint Chief Complaint  Patient presents with   Back Pain    HPI Curtis [name] is a 66 y.o. male presenting for atraumatic upper back pain between shoulder blades x4 days.  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  He does not know if is related.  He does not recall a specific injury or incident.  He says he did have some pain in his neck previously but it has resolved.  He denies any radiation of pain into his upper extremities or to his lower back or lower extremities.  He has not had any numbness, weakness or tingling.  He denies chest pain, shortness of breath.  He has not been treating his symptoms in any way.  He denies any history of back problems.  No other complaints.  HPI  Past Medical History:  Diagnosis Date   EtOH dependence [facility])     Patient Active Problem List   Diagnosis Date Noted   Incarcerated hernia 11/02/2016   Malnutrition of moderate degree 11/02/2016   Incarcerated inguinal hernia    SBO (small bowel obstruction) (HCC)    Incarcerated right inguinal hernia    Seizures ([place]) 06/26/2016   Non-recurrent unilateral inguinal hernia without obstruction or gangrene    Alcohol withdrawal ([place]) 06/21/2016    Past Surgical History:  Procedure Laterality Date   INGUINAL HERNIA REPAIR Right 11/01/2016   Procedure: HERNIA REPAIR INGUINAL INCARCERATED;  Surgeon: Jules Husbands, MD;  Location: [facility] ORS;  Service: General;  Laterality: Right;   NO PAST SURGERIES         Home Medications    Prior to Admission medications   Medication Sig Start Date End Date Taking? Authorizing Provider  baclofen (LIORESAL) 10 MG tablet Take 1 tablet (10 mg total) by mouth 3 (three) times daily as needed for muscle spasms. 02/06/22  Yes Danton Clap, PA-C  meloxicam (MOBIC) 15 MG tablet Take 1 tablet (15 mg total) by  mouth daily as needed for up to 15 days for pain. 02/06/22 02/21/22 Yes Laurene Footman B, PA-C  chlordiazePOXIDE (LIBRIUM) 5 MG capsule Take 1 capsule (5 mg total) by mouth 3 (three) times daily. Patient not taking: Reported on 11/02/2016 06/28/16   Hillary Bow, MD  folic acid (FOLVITE) 1 MG tablet Take 1 tablet (1 mg total) by mouth daily. Patient not taking: Reported on 11/02/2016 06/25/16   Demetrios Loll, MD  Multiple Vitamin (MULTIVITAMIN WITH MINERALS) TABS tablet Take 1 tablet by mouth daily. Patient not taking: Reported on 11/02/2016 06/25/16   Demetrios Loll, MD  thiamine 100 MG tablet Take 1 tablet (100 mg total) by mouth daily. Patient not taking: Reported on 11/02/2016 06/25/16   Demetrios Loll, MD    Family History Family History  Family history unknown: Yes    Social History Social History   Tobacco Use   Smoking status: Every Day    Packs/day: 1.00    Types: Cigarettes   Smokeless tobacco: Current    Types: Chew  Vaping Use   Vaping Use: Never used  Substance Use Topics   Alcohol use: Yes    Alcohol/week: 126.0 standard drinks of alcohol    Types: 126 Cans of beer per week    Comment: pt repors he drinks 5 or 6 beers a day.  Drug use: No     Allergies   Patient has no known allergies.   Review of Systems Review of Systems  Constitutional:  Negative for fatigue.  Respiratory:  Negative for shortness of breath.   Cardiovascular:  Negative for chest pain.  Gastrointestinal:  Negative for abdominal pain, nausea and vomiting.  Genitourinary:  Negative for flank pain.  Musculoskeletal:  Positive for back pain. Negative for arthralgias and gait problem.  Neurological:  Negative for weakness and numbness.     Physical Exam Triage Vital Signs ED Triage Vitals  Enc Vitals Group     BP      Pulse      Resp      Temp      Temp src      SpO2      Weight      Height      Head Circumference      Peak Flow      Pain Score      Pain Loc      Pain Edu?      Excl. in  GC?    No data found.  Updated Vital Signs BP (!) 107/59 (BP Location: Left Arm)   Pulse 91   Temp 99.1 F (37.3 C) (Oral)   Resp 16   Ht 5\' 7"  (1.702 m)   Wt 163 lb 2.3 oz (74 kg)   SpO2 96%   BMI 25.55 kg/m    Physical Exam Vitals and nursing note reviewed.  Constitutional:      General: He is not in acute distress.    Appearance: Normal appearance. He is well-developed.  HENT:     Head: Normocephalic and atraumatic.  Eyes:     General: No scleral icterus.    Conjunctiva/sclera: Conjunctivae normal.  Cardiovascular:     Rate and Rhythm: Normal rate and regular rhythm.     Heart sounds: Normal heart sounds.  Pulmonary:     Effort: Pulmonary effort is normal. No respiratory distress.     Breath sounds: Normal breath sounds.  Musculoskeletal:     Cervical back: Neck supple. No tenderness or bony tenderness. No pain with movement. Normal range of motion.     Thoracic back: Tenderness (TTP bilateral upper thoracic paravertebral muscles and bilateral trapezius muscles) present. No swelling or bony tenderness. Normal range of motion.     Lumbar back: Normal.  Skin:    General: Skin is warm and dry.     Capillary Refill: Capillary refill takes less than 2 seconds.  Neurological:     General: No focal deficit present.     Mental Status: He is alert. Mental status is at baseline.     Motor: No weakness.     Gait: Gait normal.  Psychiatric:        Mood and Affect: Mood normal.        Behavior: Behavior normal.      UC Treatments / Results  Labs (all labs ordered are listed, but only abnormal results are displayed) Labs Reviewed - No data to display  EKG   Radiology No results found.  Procedures Procedures (including critical care time)  Medications Ordered in UC Medications - No data to display  Initial Impression / Assessment and Plan / UC Course  I have reviewed the triage vital signs and the nursing notes.  Pertinent labs & imaging results that were  available during my care of the patient were reviewed by me and considered in my medical  decision making (see chart for details).   66 year old male presents for bilateral upper thoracic back pain and pain between shoulder blades for the past 4 days.  Denies any injury but states that he has been lifting and burning a lot of wood.  Has not treated condition in any way.  No other symptoms.  Patient is overall well-appearing.  He has no spinal tenderness of the neck or back.  He does have tenderness palpation of the upper thoracic paravertebral muscles and bilateral trapezius muscles.  Full strength of upper and lower extremities and normal gait.  Suspect strain of thoracic muscles.  We will treat at this time with meloxicam and baclofen.  Also reviewed RICE guidelines and use of heat, ice, etc.  Reviewed ED precautions related to back pain.   Final Clinical Impressions(s) / UC Diagnoses   Final diagnoses:  Acute bilateral thoracic back pain     Discharge Instructions      BACK PAIN: Stressed avoiding painful activities . RICE (REST, ICE, COMPRESSION, ELEVATION) guidelines reviewed. May alternate ice and heat. Consider use of muscle rubs, Salonpas patches, etc. Use medications as directed including muscle relaxers if prescribed. Take anti-inflammatory medications as prescribed or OTC NSAIDs/Tylenol.  F/u with PCP in 7-10 days for reexamination, and please feel free to call or return to the urgent care at any time for any questions or concerns you may have and we will be happy to help you!   BACK PAIN RED FLAGS: If the back pain acutely worsens or there are any red flag symptoms such as numbness/tingling, leg weakness, saddle anesthesia, or loss of bowel/bladder control, go immediately to the ER. Follow up with [place] as scheduled or sooner if the pain does not begin to resolve or if it worsens before the follow up       ED Prescriptions     Medication Sig Dispense Auth. Provider   meloxicam  (MOBIC) 15 MG tablet Take 1 tablet (15 mg total) by mouth daily as needed for up to 15 days for pain. 15 tablet Eusebio Friendly B, PA-C   baclofen (LIORESAL) 10 MG tablet Take 1 tablet (10 mg total) by mouth 3 (three) times daily as needed for muscle spasms. 30 each Shirlee Latch, PA-C      I have reviewed the PDMP during this encounter.   Shirlee Latch, PA-C 02/06/22 1334

## 2022-02-06 NOTE — Discharge Instructions (Addendum)

## 2023-04-25 ENCOUNTER — Emergency Department: Payer: 59

## 2023-04-25 ENCOUNTER — Inpatient Hospital Stay
Admission: EM | Admit: 2023-04-25 | Discharge: 2023-05-21 | DRG: 870 | Disposition: A | Discharge status: Skilled Nursing Facility | Payer: 59 | Attending: Student | Admitting: Student

## 2023-04-25 ENCOUNTER — Inpatient Hospital Stay: Payer: 59

## 2023-04-25 DIAGNOSIS — D649 Anemia, unspecified: Secondary | ICD-10-CM | POA: Diagnosis not present

## 2023-04-25 DIAGNOSIS — J9602 Acute respiratory failure with hypercapnia: Secondary | ICD-10-CM | POA: Diagnosis not present

## 2023-04-25 DIAGNOSIS — Z79899 Other long term (current) drug therapy: Secondary | ICD-10-CM

## 2023-04-25 DIAGNOSIS — Y906 Blood alcohol level of 120-199 mg/100 ml: Secondary | ICD-10-CM | POA: Diagnosis present

## 2023-04-25 DIAGNOSIS — J690 Pneumonitis due to inhalation of food and vomit: Secondary | ICD-10-CM | POA: Diagnosis present

## 2023-04-25 DIAGNOSIS — Z7189 Other specified counseling: Secondary | ICD-10-CM | POA: Diagnosis not present

## 2023-04-25 DIAGNOSIS — J440 Chronic obstructive pulmonary disease with acute lower respiratory infection: Secondary | ICD-10-CM | POA: Diagnosis present

## 2023-04-25 DIAGNOSIS — E870 Hyperosmolality and hypernatremia: Secondary | ICD-10-CM | POA: Diagnosis not present

## 2023-04-25 DIAGNOSIS — L89892 Pressure ulcer of other site, stage 2: Secondary | ICD-10-CM | POA: Diagnosis not present

## 2023-04-25 DIAGNOSIS — F10231 Alcohol dependence with withdrawal delirium: Secondary | ICD-10-CM | POA: Diagnosis not present

## 2023-04-25 DIAGNOSIS — Z66 Do not resuscitate: Secondary | ICD-10-CM | POA: Diagnosis present

## 2023-04-25 DIAGNOSIS — R6520 Severe sepsis without septic shock: Secondary | ICD-10-CM

## 2023-04-25 DIAGNOSIS — J9601 Acute respiratory failure with hypoxia: Secondary | ICD-10-CM | POA: Diagnosis present

## 2023-04-25 DIAGNOSIS — Z781 Physical restraint status: Secondary | ICD-10-CM

## 2023-04-25 DIAGNOSIS — I2489 Other forms of acute ischemic heart disease: Secondary | ICD-10-CM

## 2023-04-25 DIAGNOSIS — I21A1 Myocardial infarction type 2: Secondary | ICD-10-CM | POA: Diagnosis present

## 2023-04-25 DIAGNOSIS — N179 Acute kidney failure, unspecified: Secondary | ICD-10-CM | POA: Diagnosis not present

## 2023-04-25 DIAGNOSIS — I4892 Unspecified atrial flutter: Secondary | ICD-10-CM | POA: Diagnosis present

## 2023-04-25 DIAGNOSIS — R6521 Severe sepsis with septic shock: Secondary | ICD-10-CM | POA: Diagnosis present

## 2023-04-25 DIAGNOSIS — R7401 Elevation of levels of liver transaminase levels: Secondary | ICD-10-CM | POA: Diagnosis not present

## 2023-04-25 DIAGNOSIS — I480 Paroxysmal atrial fibrillation: Secondary | ICD-10-CM | POA: Diagnosis not present

## 2023-04-25 DIAGNOSIS — E781 Pure hyperglyceridemia: Secondary | ICD-10-CM | POA: Diagnosis present

## 2023-04-25 DIAGNOSIS — E871 Hypo-osmolality and hyponatremia: Secondary | ICD-10-CM | POA: Diagnosis present

## 2023-04-25 DIAGNOSIS — G9341 Metabolic encephalopathy: Secondary | ICD-10-CM | POA: Diagnosis present

## 2023-04-25 DIAGNOSIS — H018 Other specified inflammations of eyelid: Secondary | ICD-10-CM | POA: Diagnosis present

## 2023-04-25 DIAGNOSIS — R579 Shock, unspecified: Secondary | ICD-10-CM

## 2023-04-25 DIAGNOSIS — E162 Hypoglycemia, unspecified: Secondary | ICD-10-CM | POA: Diagnosis present

## 2023-04-25 DIAGNOSIS — I214 Non-ST elevation (NSTEMI) myocardial infarction: Secondary | ICD-10-CM

## 2023-04-25 DIAGNOSIS — Z1152 Encounter for screening for COVID-19: Secondary | ICD-10-CM | POA: Diagnosis not present

## 2023-04-25 DIAGNOSIS — K4091 Unilateral inguinal hernia, without obstruction or gangrene, recurrent: Secondary | ICD-10-CM | POA: Diagnosis present

## 2023-04-25 DIAGNOSIS — N390 Urinary tract infection, site not specified: Principal | ICD-10-CM | POA: Diagnosis present

## 2023-04-25 DIAGNOSIS — E875 Hyperkalemia: Secondary | ICD-10-CM | POA: Diagnosis not present

## 2023-04-25 DIAGNOSIS — B960 Mycoplasma pneumoniae [M. pneumoniae] as the cause of diseases classified elsewhere: Secondary | ICD-10-CM | POA: Diagnosis present

## 2023-04-25 DIAGNOSIS — F10229 Alcohol dependence with intoxication, unspecified: Secondary | ICD-10-CM | POA: Diagnosis present

## 2023-04-25 DIAGNOSIS — E0781 Sick-euthyroid syndrome: Secondary | ICD-10-CM | POA: Diagnosis present

## 2023-04-25 DIAGNOSIS — J441 Chronic obstructive pulmonary disease with (acute) exacerbation: Secondary | ICD-10-CM | POA: Diagnosis not present

## 2023-04-25 DIAGNOSIS — M6282 Rhabdomyolysis: Secondary | ICD-10-CM | POA: Diagnosis present

## 2023-04-25 DIAGNOSIS — Z6824 Body mass index (BMI) 24.0-24.9, adult: Secondary | ICD-10-CM

## 2023-04-25 DIAGNOSIS — E43 Unspecified severe protein-calorie malnutrition: Secondary | ICD-10-CM | POA: Diagnosis present

## 2023-04-25 DIAGNOSIS — N170 Acute kidney failure with tubular necrosis: Secondary | ICD-10-CM | POA: Diagnosis not present

## 2023-04-25 DIAGNOSIS — I440 Atrioventricular block, first degree: Secondary | ICD-10-CM | POA: Diagnosis present

## 2023-04-25 DIAGNOSIS — R739 Hyperglycemia, unspecified: Secondary | ICD-10-CM | POA: Diagnosis not present

## 2023-04-25 DIAGNOSIS — Z888 Allergy status to other drugs, medicaments and biological substances status: Secondary | ICD-10-CM

## 2023-04-25 DIAGNOSIS — K5730 Diverticulosis of large intestine without perforation or abscess without bleeding: Secondary | ICD-10-CM | POA: Diagnosis present

## 2023-04-25 DIAGNOSIS — J329 Chronic sinusitis, unspecified: Secondary | ICD-10-CM | POA: Diagnosis present

## 2023-04-25 DIAGNOSIS — Z515 Encounter for palliative care: Secondary | ICD-10-CM

## 2023-04-25 DIAGNOSIS — F17210 Nicotine dependence, cigarettes, uncomplicated: Secondary | ICD-10-CM | POA: Diagnosis present

## 2023-04-25 DIAGNOSIS — R7989 Other specified abnormal findings of blood chemistry: Secondary | ICD-10-CM | POA: Diagnosis not present

## 2023-04-25 DIAGNOSIS — R569 Unspecified convulsions: Secondary | ICD-10-CM | POA: Diagnosis present

## 2023-04-25 DIAGNOSIS — I10 Essential (primary) hypertension: Secondary | ICD-10-CM | POA: Diagnosis present

## 2023-04-25 DIAGNOSIS — D696 Thrombocytopenia, unspecified: Secondary | ICD-10-CM | POA: Diagnosis not present

## 2023-04-25 DIAGNOSIS — A419 Sepsis, unspecified organism: Principal | ICD-10-CM | POA: Diagnosis present

## 2023-04-25 DIAGNOSIS — T68XXXA Hypothermia, initial encounter: Secondary | ICD-10-CM | POA: Diagnosis not present

## 2023-04-25 DIAGNOSIS — X31XXXA Exposure to excessive natural cold, initial encounter: Secondary | ICD-10-CM

## 2023-04-25 DIAGNOSIS — I4510 Unspecified right bundle-branch block: Secondary | ICD-10-CM | POA: Diagnosis present

## 2023-04-25 DIAGNOSIS — D539 Nutritional anemia, unspecified: Secondary | ICD-10-CM | POA: Diagnosis not present

## 2023-04-25 DIAGNOSIS — B964 Proteus (mirabilis) (morganii) as the cause of diseases classified elsewhere: Secondary | ICD-10-CM | POA: Diagnosis present

## 2023-04-25 DIAGNOSIS — E876 Hypokalemia: Secondary | ICD-10-CM | POA: Diagnosis present

## 2023-04-25 DIAGNOSIS — I4581 Long QT syndrome: Secondary | ICD-10-CM | POA: Diagnosis not present

## 2023-04-25 DIAGNOSIS — L89312 Pressure ulcer of right buttock, stage 2: Secondary | ICD-10-CM | POA: Diagnosis not present

## 2023-04-25 DIAGNOSIS — R54 Age-related physical debility: Secondary | ICD-10-CM | POA: Diagnosis present

## 2023-04-25 DIAGNOSIS — J189 Pneumonia, unspecified organism: Secondary | ICD-10-CM | POA: Diagnosis not present

## 2023-04-25 HISTORY — DX: Personal history of other medical treatment: Z92.89

## 2023-04-25 HISTORY — DX: Unilateral inguinal hernia, with obstruction, without gangrene, not specified as recurrent: K40.30

## 2023-04-25 LAB — BLOOD GAS, ARTERIAL
Acid-base deficit: 19.8 mmol/L — ABNORMAL HIGH (ref 0.0–2.0)
Bicarbonate: 9.3 mmol/L — ABNORMAL LOW (ref 20.0–28.0)
FIO2: 40 %
MECHVT: 500 mL
Mechanical Rate: 22
O2 Saturation: 99.6 %
PEEP: 8 cmH2O
Patient temperature: 37
pCO2 arterial: 32 mm[Hg] (ref 32–48)
pH, Arterial: 7.07 — CL (ref 7.35–7.45)
pO2, Arterial: 134 mm[Hg] — ABNORMAL HIGH (ref 83–108)

## 2023-04-25 LAB — URINALYSIS, W/ REFLEX TO CULTURE (INFECTION SUSPECTED)
Bilirubin Urine: NEGATIVE
Glucose, UA: NEGATIVE mg/dL
Ketones, ur: NEGATIVE mg/dL
Nitrite: NEGATIVE
Protein, ur: 100 mg/dL — AB
Specific Gravity, Urine: 1.012 (ref 1.005–1.030)
pH: 5 (ref 5.0–8.0)

## 2023-04-25 LAB — COMPREHENSIVE METABOLIC PANEL
ALT: 83 U/L — ABNORMAL HIGH (ref 0–44)
AST: 284 U/L — ABNORMAL HIGH (ref 15–41)
Albumin: 3.5 g/dL (ref 3.5–5.0)
Alkaline Phosphatase: 125 U/L (ref 38–126)
Anion gap: 23 — ABNORMAL HIGH (ref 5–15)
BUN: 31 mg/dL — ABNORMAL HIGH (ref 8–23)
CO2: 16 mmol/L — ABNORMAL LOW (ref 22–32)
Calcium: 8.3 mg/dL — ABNORMAL LOW (ref 8.9–10.3)
Chloride: 92 mmol/L — ABNORMAL LOW (ref 98–111)
Creatinine, Ser: 1.91 mg/dL — ABNORMAL HIGH (ref 0.61–1.24)
GFR, Estimated: 37 mL/min — ABNORMAL LOW (ref >60)
Glucose, Bld: 286 mg/dL — ABNORMAL HIGH (ref 70–99)
Potassium: 4.8 mmol/L (ref 3.5–5.1)
Sodium: 131 mmol/L — ABNORMAL LOW (ref 135–145)
Total Bilirubin: 1.1 mg/dL (ref 0.0–1.2)
Total Protein: 6.5 g/dL (ref 6.5–8.1)

## 2023-04-25 LAB — CBC WITH DIFFERENTIAL/PLATELET
Abs Immature Granulocytes: 0.18 10*3/uL — ABNORMAL HIGH (ref 0.00–0.07)
Basophils Absolute: 0.1 10*3/uL (ref 0.0–0.1)
Basophils Relative: 1 %
Eosinophils Absolute: 0 10*3/uL (ref 0.0–0.5)
Eosinophils Relative: 0 %
HCT: 40.1 % (ref 39.0–52.0)
Hemoglobin: 13.3 g/dL (ref 13.0–17.0)
Immature Granulocytes: 1 %
Lymphocytes Relative: 8 %
Lymphs Abs: 1.3 10*3/uL (ref 0.7–4.0)
MCH: 32.8 pg (ref 26.0–34.0)
MCHC: 33.2 g/dL (ref 30.0–36.0)
MCV: 99 fL (ref 80.0–100.0)
Monocytes Absolute: 0.6 10*3/uL (ref 0.1–1.0)
Monocytes Relative: 4 %
Neutro Abs: 13.5 10*3/uL — ABNORMAL HIGH (ref 1.7–7.7)
Neutrophils Relative %: 86 %
Platelets: 205 10*3/uL (ref 150–400)
RBC: 4.05 MIL/uL — ABNORMAL LOW (ref 4.22–5.81)
RDW: 13.3 % (ref 11.5–15.5)
WBC: 15.7 10*3/uL — ABNORMAL HIGH (ref 4.0–10.5)
nRBC: 0 % (ref 0.0–0.2)

## 2023-04-25 LAB — APTT: aPTT: 35 s (ref 24–36)

## 2023-04-25 LAB — URINE DRUG SCREEN, QUALITATIVE (ARMC ONLY)
Amphetamines, Ur Screen: NOT DETECTED
Barbiturates, Ur Screen: NOT DETECTED
Benzodiazepine, Ur Scrn: NOT DETECTED
Cannabinoid 50 Ng, Ur ~~LOC~~: NOT DETECTED
Cocaine Metabolite,Ur ~~LOC~~: NOT DETECTED
MDMA (Ecstasy)Ur Screen: NOT DETECTED
Methadone Scn, Ur: NOT DETECTED
Opiate, Ur Screen: NOT DETECTED
Phencyclidine (PCP) Ur S: NOT DETECTED
Tricyclic, Ur Screen: NOT DETECTED

## 2023-04-25 LAB — RESP PANEL BY RT-PCR (RSV, FLU A&B, COVID)  RVPGX2
Influenza A by PCR: NEGATIVE
Influenza B by PCR: NEGATIVE
Resp Syncytial Virus by PCR: NEGATIVE
SARS Coronavirus 2 by RT PCR: NEGATIVE

## 2023-04-25 LAB — ETHANOL: Alcohol, Ethyl (B): 177 mg/dL — ABNORMAL HIGH (ref <10)

## 2023-04-25 LAB — CK: Total CK: 5249 U/L — ABNORMAL HIGH (ref 49–397)

## 2023-04-25 LAB — PROTIME-INR
INR: 1.4 — ABNORMAL HIGH (ref 0.8–1.2)
Prothrombin Time: 17.2 s — ABNORMAL HIGH (ref 11.4–15.2)

## 2023-04-25 LAB — LACTIC ACID, PLASMA
Lactic Acid, Venous: >9 mmol/L (ref 0.5–1.9)
Lactic Acid, Venous: >9 mmol/L (ref 0.5–1.9)

## 2023-04-25 LAB — TROPONIN I (HIGH SENSITIVITY): Troponin I (High Sensitivity): 32 ng/L — ABNORMAL HIGH (ref <18)

## 2023-04-25 LAB — SALICYLATE LEVEL: Salicylate Lvl: <7 mg/dL — ABNORMAL LOW (ref 7.0–30.0)

## 2023-04-25 LAB — BLOOD GAS, VENOUS
Acid-base deficit: 16.6 mmol/L — ABNORMAL HIGH (ref 0.0–2.0)
Bicarbonate: 16.2 mmol/L — ABNORMAL LOW (ref 20.0–28.0)
O2 Saturation: 46.6 %
Patient temperature: 37
pCO2, Ven: 72 mm[Hg] (ref 44–60)
pH, Ven: 6.96 — CL (ref 7.25–7.43)
pO2, Ven: 39 mm[Hg] (ref 32–45)

## 2023-04-25 LAB — CBG MONITORING, ED
Glucose-Capillary: 205 mg/dL — ABNORMAL HIGH (ref 70–99)
Glucose-Capillary: 109 mg/dL — ABNORMAL HIGH (ref 70–99)

## 2023-04-25 LAB — GLUCOSE, CAPILLARY: Glucose-Capillary: 82 mg/dL (ref 70–99)

## 2023-04-25 LAB — ACETAMINOPHEN LEVEL: Acetaminophen (Tylenol), Serum: <10 ug/mL — ABNORMAL LOW (ref 10–30)

## 2023-04-25 LAB — MRSA NEXT GEN BY PCR, NASAL: MRSA by PCR Next Gen: NOT DETECTED

## 2023-04-25 MED ORDER — ROCURONIUM BROMIDE 10 MG/ML (PF) SYRINGE
100.0000 mg | PREFILLED_SYRINGE | Freq: Once | INTRAVENOUS | Status: AC
  Administered 2023-04-25: 100 mg via INTRAVENOUS
  Filled 2023-04-25: qty 10

## 2023-04-25 MED ORDER — SODIUM CHLORIDE 0.9 % IV SOLN
2.0000 g | Freq: Once | INTRAVENOUS | Status: AC
  Administered 2023-04-25: 2 g via INTRAVENOUS
  Filled 2023-04-25: qty 20

## 2023-04-25 MED ORDER — FAMOTIDINE 20 MG PO TABS
20.0000 mg | ORAL_TABLET | Freq: Two times a day (BID) | ORAL | Status: DC
  Administered 2023-04-25: 20 mg
  Filled 2023-04-25: qty 1

## 2023-04-25 MED ORDER — FENTANYL 2500MCG IN NS 250ML (10MCG/ML) PREMIX INFUSION
0.0000 ug/h | INTRAVENOUS | Status: DC
  Administered 2023-04-25: 25 ug/h via INTRAVENOUS
  Administered 2023-04-26: 50 ug/h via INTRAVENOUS
  Administered 2023-04-28: 125 ug/h via INTRAVENOUS
  Administered 2023-04-29: 100 ug/h via INTRAVENOUS
  Administered 2023-05-01: 50 ug/h via INTRAVENOUS
  Filled 2023-04-25 (×5): qty 250

## 2023-04-25 MED ORDER — ETOMIDATE 2 MG/ML IV SOLN
10.0000 mg | Freq: Once | INTRAVENOUS | Status: AC
  Administered 2023-04-25: 10 mg via INTRAVENOUS
  Filled 2023-04-25: qty 10

## 2023-04-25 MED ORDER — MIDAZOLAM HCL 2 MG/2ML IJ SOLN
2.0000 mg | Freq: Once | INTRAMUSCULAR | Status: AC
Start: 2023-04-25 — End: 2023-04-25
  Administered 2023-04-25: 2 mg via INTRAMUSCULAR
  Filled 2023-04-25: qty 2

## 2023-04-25 MED ORDER — THIAMINE HCL 100 MG/ML IJ SOLN
100.0000 mg | Freq: Once | INTRAMUSCULAR | Status: AC
  Administered 2023-04-25: 100 mg via INTRAVENOUS
  Filled 2023-04-25: qty 2

## 2023-04-25 MED ORDER — SODIUM BICARBONATE 8.4 % IV SOLN
INTRAVENOUS | Status: DC
  Filled 2023-04-25: qty 150
  Filled 2023-04-25: qty 1000

## 2023-04-25 MED ORDER — DOCUSATE SODIUM 100 MG PO CAPS: 100.0000 mg | ORAL_CAPSULE | Freq: Two times a day (BID) | ORAL | Status: DC | PRN

## 2023-04-25 MED ORDER — LACTATED RINGERS IV BOLUS
500.0000 mL | Freq: Once | INTRAVENOUS | Status: AC
  Administered 2023-04-25: 500 mL via INTRAVENOUS

## 2023-04-25 MED ORDER — SODIUM CHLORIDE 0.9 % IV BOLUS
1000.0000 mL | Freq: Once | INTRAVENOUS | Status: AC
  Administered 2023-04-25: 1000 mL via INTRAVENOUS

## 2023-04-25 MED ORDER — DEXTROSE 50 % IV SOLN
INTRAVENOUS | Status: AC
  Filled 2023-04-25: qty 50

## 2023-04-25 MED ORDER — POLYETHYLENE GLYCOL 3350 17 G PO PACK: 17.0000 g | PACK | Freq: Every day | ORAL | Status: DC | PRN

## 2023-04-25 MED ORDER — MIDAZOLAM-SODIUM CHLORIDE 100-0.9 MG/100ML-% IV SOLN
0.5000 mg/h | INTRAVENOUS | Status: DC
  Administered 2023-04-25 – 2023-04-26 (×2): 0.5 mg/h via INTRAVENOUS
  Filled 2023-04-25: qty 100

## 2023-04-25 NOTE — ED Triage Notes (Signed)
 Pt from home, neighbor called for unresponsiveness. Pt responsive to pain. Per EMS CBG 34 1 mg gliucagon, CBG 29 on arrival per EMS. Last seen 3 days ago

## 2023-04-25 NOTE — ED Notes (Signed)
Airway in

## 2023-04-25 NOTE — ED Notes (Signed)
 Report given to [facility], ICU RN.  All questions answered.

## 2023-04-25 NOTE — Progress Notes (Addendum)
 Elink monitoring for the code sepsis protocol.  2140 Notified bedside nurse of need to draw repeat lactic acid. ICU RN will have lab collect when the patient gets to the unit.

## 2023-04-25 NOTE — ED Notes (Signed)
 Per Lanora Manis critical care NP, turn off bair hugger and turn down aritic sun to 0.32 degrees per hr.

## 2023-04-25 NOTE — Progress Notes (Signed)
 Pt transported to CT then to ICU 7 on the vent without incident. Pt remains on the vent and is tol well at this time. Report given to ICU RT.

## 2023-04-25 NOTE — Plan of Care (Signed)

## 2023-04-25 NOTE — Progress Notes (Signed)
 eLink Physician-Brief Progress Note Patient Name: Curtis [name] DOB: 1956-03-28 MRN: [id number]   Date of Service  04/25/2023  HPI/Events of Note  68 year old male with a history of alcohol use disorder who initially presented from home with unresponsiveness found to be hypoglycemic requiring intubation and transferred to the ICU for further management.  On examination, the patient is hypothermic, tachypneic, normotensive.  Saturating 99% on 40% FiO2.  Results show severe metabolic acidosis with anion gap, elevated creatinine, hyperglycemia, and transaminitis.  CK is elevated and lactic acid is elevated above detectable.  Leukocytosis present.  Alcohol level elevated to 177.  Remaining UDS negative.  CT chest/abdomen/pelvis with no significant acute findings   eICU Interventions  Daily spontaneous awakening and breathing trials.  Consider switching off midazolam  and fentanyl  to propofol  and intermittent analgesia.  Consider initiation of Librium  taper over the next 24 hours.  Multivitamins.   If acidosis fails to resolve, may benefit from early RRT.  Trend lactic acid, CK, troponin.  Trend LFTs.    Trend coagulopathy and electrolytes in the setting of hypothermia. Maintain external warming measures  Famotidine  for GI prophylaxis Consider adding heparin  for DVT prophylaxis  Will review orders after ground team evaluation.    Intervention Category Evaluation Type: New Patient Evaluation  Loletta Harper 04/25/2023, 10:36 PM
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
## 2023-04-26 ENCOUNTER — Inpatient Hospital Stay: Payer: 59

## 2023-04-26 ENCOUNTER — Encounter: Payer: Self-pay | Admitting: Student in an Organized Health Care Education/Training Program

## 2023-04-26 ENCOUNTER — Inpatient Hospital Stay (HOSPITAL_COMMUNITY)
Admit: 2023-04-26 | Discharge: 2023-04-26 | Disposition: A | Discharge status: Home / Self Care | Payer: 59 | Attending: Student in an Organized Health Care Education/Training Program | Admitting: Student in an Organized Health Care Education/Training Program

## 2023-04-26 DIAGNOSIS — N390 Urinary tract infection, site not specified: Principal | ICD-10-CM

## 2023-04-26 DIAGNOSIS — T68XXXA Hypothermia, initial encounter: Secondary | ICD-10-CM

## 2023-04-26 DIAGNOSIS — M6282 Rhabdomyolysis: Secondary | ICD-10-CM

## 2023-04-26 DIAGNOSIS — R579 Shock, unspecified: Secondary | ICD-10-CM

## 2023-04-26 DIAGNOSIS — G9341 Metabolic encephalopathy: Secondary | ICD-10-CM | POA: Diagnosis not present

## 2023-04-26 DIAGNOSIS — I214 Non-ST elevation (NSTEMI) myocardial infarction: Secondary | ICD-10-CM

## 2023-04-26 DIAGNOSIS — N179 Acute kidney failure, unspecified: Secondary | ICD-10-CM

## 2023-04-26 DIAGNOSIS — R7989 Other specified abnormal findings of blood chemistry: Secondary | ICD-10-CM | POA: Diagnosis not present

## 2023-04-26 DIAGNOSIS — I4581 Long QT syndrome: Secondary | ICD-10-CM | POA: Diagnosis not present

## 2023-04-26 DIAGNOSIS — A419 Sepsis, unspecified organism: Principal | ICD-10-CM

## 2023-04-26 DIAGNOSIS — R6520 Severe sepsis without septic shock: Secondary | ICD-10-CM

## 2023-04-26 LAB — CBC
HCT: 35.8 % — ABNORMAL LOW (ref 39.0–52.0)
Hemoglobin: 12.3 g/dL — ABNORMAL LOW (ref 13.0–17.0)
MCH: 33.5 pg (ref 26.0–34.0)
MCHC: 34.4 g/dL (ref 30.0–36.0)
MCV: 97.5 fL (ref 80.0–100.0)
Platelets: 193 10*3/uL (ref 150–400)
RBC: 3.67 MIL/uL — ABNORMAL LOW (ref 4.22–5.81)
RDW: 13.2 % (ref 11.5–15.5)
WBC: 6.4 10*3/uL (ref 4.0–10.5)
nRBC: 0 % (ref 0.0–0.2)

## 2023-04-26 LAB — STREP PNEUMONIAE URINARY ANTIGEN: Strep Pneumo Urinary Antigen: NEGATIVE

## 2023-04-26 LAB — GLUCOSE, CAPILLARY
Glucose-Capillary: 101 mg/dL — ABNORMAL HIGH (ref 70–99)
Glucose-Capillary: 130 mg/dL — ABNORMAL HIGH (ref 70–99)
Glucose-Capillary: 134 mg/dL — ABNORMAL HIGH (ref 70–99)
Glucose-Capillary: 157 mg/dL — ABNORMAL HIGH (ref 70–99)
Glucose-Capillary: 166 mg/dL — ABNORMAL HIGH (ref 70–99)
Glucose-Capillary: 169 mg/dL — ABNORMAL HIGH (ref 70–99)
Glucose-Capillary: 171 mg/dL — ABNORMAL HIGH (ref 70–99)
Glucose-Capillary: 198 mg/dL — ABNORMAL HIGH (ref 70–99)
Glucose-Capillary: 84 mg/dL (ref 70–99)
Glucose-Capillary: 85 mg/dL (ref 70–99)

## 2023-04-26 LAB — BLOOD GAS, ARTERIAL
Acid-Base Excess: 1 mmol/L (ref 0.0–2.0)
Acid-Base Excess: 0.8 mmol/L (ref 0.0–2.0)
Acid-base deficit: 8.8 mmol/L — ABNORMAL HIGH (ref 0.0–2.0)
Bicarbonate: 18 mmol/L — ABNORMAL LOW (ref 20.0–28.0)
Bicarbonate: 26.6 mmol/L (ref 20.0–28.0)
Bicarbonate: 26.6 mmol/L (ref 20.0–28.0)
FIO2: 40 %
FIO2: 40 %
FIO2: 40 %
MECHVT: 500 mL
MECHVT: 500 mL
MECHVT: 500 mL
Mechanical Rate: 26
Mechanical Rate: 26
Mechanical Rate: 22
O2 Saturation: 97.9 %
O2 Saturation: 96.8 %
O2 Saturation: 97.3 %
PEEP: 8 cmH2O
PEEP: 8 cmH2O
PEEP: 8 cmH2O
Patient temperature: 37
Patient temperature: 37
Patient temperature: 37
pCO2 arterial: 41 mm[Hg] (ref 32–48)
pCO2 arterial: 45 mm[Hg] (ref 32–48)
pCO2 arterial: 46 mm[Hg] (ref 32–48)
pH, Arterial: 7.25 — ABNORMAL LOW (ref 7.35–7.45)
pH, Arterial: 7.38 (ref 7.35–7.45)
pH, Arterial: 7.37 (ref 7.35–7.45)
pO2, Arterial: 95 mm[Hg] (ref 83–108)
pO2, Arterial: 82 mm[Hg] — ABNORMAL LOW (ref 83–108)
pO2, Arterial: 83 mm[Hg] (ref 83–108)

## 2023-04-26 LAB — BASIC METABOLIC PANEL
Anion gap: 24 — ABNORMAL HIGH (ref 5–15)
Anion gap: 19 — ABNORMAL HIGH (ref 5–15)
Anion gap: 15 (ref 5–15)
BUN: 32 mg/dL — ABNORMAL HIGH (ref 8–23)
BUN: 40 mg/dL — ABNORMAL HIGH (ref 8–23)
BUN: 43 mg/dL — ABNORMAL HIGH (ref 8–23)
CO2: 16 mmol/L — ABNORMAL LOW (ref 22–32)
CO2: 21 mmol/L — ABNORMAL LOW (ref 22–32)
CO2: 26 mmol/L (ref 22–32)
Calcium: 7.1 mg/dL — ABNORMAL LOW (ref 8.9–10.3)
Calcium: 6.2 mg/dL — CL (ref 8.9–10.3)
Calcium: 6.1 mg/dL — CL (ref 8.9–10.3)
Chloride: 100 mmol/L (ref 98–111)
Chloride: 96 mmol/L — ABNORMAL LOW (ref 98–111)
Chloride: 94 mmol/L — ABNORMAL LOW (ref 98–111)
Creatinine, Ser: 1.79 mg/dL — ABNORMAL HIGH (ref 0.61–1.24)
Creatinine, Ser: 2.37 mg/dL — ABNORMAL HIGH (ref 0.61–1.24)
Creatinine, Ser: 2.64 mg/dL — ABNORMAL HIGH (ref 0.61–1.24)
GFR, Estimated: 41 mL/min — ABNORMAL LOW (ref >60)
GFR, Estimated: 29 mL/min — ABNORMAL LOW (ref >60)
GFR, Estimated: 25 mL/min — ABNORMAL LOW (ref >60)
Glucose, Bld: 94 mg/dL (ref 70–99)
Glucose, Bld: 190 mg/dL — ABNORMAL HIGH (ref 70–99)
Glucose, Bld: 208 mg/dL — ABNORMAL HIGH (ref 70–99)
Potassium: 4.6 mmol/L (ref 3.5–5.1)
Potassium: 5.7 mmol/L — ABNORMAL HIGH (ref 3.5–5.1)
Potassium: 4.8 mmol/L (ref 3.5–5.1)
Sodium: 140 mmol/L (ref 135–145)
Sodium: 136 mmol/L (ref 135–145)
Sodium: 135 mmol/L (ref 135–145)

## 2023-04-26 LAB — LACTIC ACID, PLASMA
Lactic Acid, Venous: 6.7 mmol/L (ref 0.5–1.9)
Lactic Acid, Venous: 2.4 mmol/L (ref 0.5–1.9)
Lactic Acid, Venous: 2.2 mmol/L (ref 0.5–1.9)
Lactic Acid, Venous: 2.4 mmol/L (ref 0.5–1.9)
Lactic Acid, Venous: 2.5 mmol/L (ref 0.5–1.9)
Lactic Acid, Venous: 1.9 mmol/L (ref 0.5–1.9)

## 2023-04-26 LAB — BLOOD CULTURE ID PANEL (REFLEXED) - BCID2

## 2023-04-26 LAB — MAGNESIUM: Magnesium: 2 mg/dL (ref 1.7–2.4)

## 2023-04-26 LAB — RESPIRATORY PANEL BY PCR

## 2023-04-26 LAB — HEMOGLOBIN A1C
Hgb A1c MFr Bld: 5.1 % (ref 4.8–5.6)
Mean Plasma Glucose: 99.67 mg/dL

## 2023-04-26 LAB — TROPONIN I (HIGH SENSITIVITY)
Troponin I (High Sensitivity): 573 ng/L (ref <18)
Troponin I (High Sensitivity): 665 ng/L (ref <18)

## 2023-04-26 LAB — CHLAMYDIA/NGC RT PCR (ARMC ONLY)
Chlamydia Tr: NOT DETECTED
N gonorrhoeae: NOT DETECTED

## 2023-04-26 LAB — HIV ANTIBODY (ROUTINE TESTING W REFLEX): HIV Screen 4th Generation wRfx: NONREACTIVE

## 2023-04-26 LAB — OSMOLALITY, URINE: Osmolality, Ur: 346 mosm/kg (ref 300–900)

## 2023-04-26 LAB — CK
Total CK: 6344 U/L — ABNORMAL HIGH (ref 49–397)
Total CK: 6730 U/L — ABNORMAL HIGH (ref 49–397)

## 2023-04-26 LAB — TSH: TSH: 1.27 u[IU]/mL (ref 0.350–4.500)

## 2023-04-26 LAB — T4, FREE: Free T4: 0.6 ng/dL — ABNORMAL LOW (ref 0.61–1.12)

## 2023-04-26 LAB — ECHOCARDIOGRAM COMPLETE
Est EF: >55
Height: 67 in
S' Lateral: 2.1 cm
Weight: 2529.12 [oz_av]

## 2023-04-26 LAB — HEPARIN LEVEL (UNFRACTIONATED): Heparin Unfractionated: 0.77 [IU]/mL — ABNORMAL HIGH (ref 0.30–0.70)

## 2023-04-26 LAB — PHOSPHORUS: Phosphorus: 7.7 mg/dL — ABNORMAL HIGH (ref 2.5–4.6)

## 2023-04-26 LAB — OSMOLALITY: Osmolality: 306 mosm/kg — ABNORMAL HIGH (ref 275–295)

## 2023-04-26 MED ORDER — MIDAZOLAM HCL 2 MG/2ML IJ SOLN
1.0000 mg | INTRAMUSCULAR | Status: DC | PRN
  Administered 2023-04-26 – 2023-05-03 (×6): 2 mg via INTRAVENOUS
  Filled 2023-04-26 (×7): qty 2

## 2023-04-26 MED ORDER — VITAL AF 1.2 CAL PO LIQD
1000.0000 mL | ORAL | Status: DC
  Administered 2023-04-26 – 2023-05-06 (×9): 1000 mL

## 2023-04-26 MED ORDER — NOREPINEPHRINE 4 MG/250ML-% IV SOLN
2.0000 ug/min | INTRAVENOUS | Status: DC
  Administered 2023-04-26: 2 ug/min via INTRAVENOUS
  Administered 2023-04-26: 8 ug/min via INTRAVENOUS
  Administered 2023-04-27: 2 ug/min via INTRAVENOUS
  Filled 2023-04-26 (×3): qty 250

## 2023-04-26 MED ORDER — DEXMEDETOMIDINE HCL IN NACL 400 MCG/100ML IV SOLN: 0.2000 ug/kg/h | INTRAVENOUS | Status: DC

## 2023-04-26 MED ORDER — MIDAZOLAM HCL 2 MG/2ML IJ SOLN
INTRAMUSCULAR | Status: AC
  Filled 2023-04-26: qty 2

## 2023-04-26 MED ORDER — SODIUM BICARBONATE 8.4 % IV SOLN
INTRAVENOUS | Status: AC
  Administered 2023-04-26: 50 meq
  Filled 2023-04-26: qty 50

## 2023-04-26 MED ORDER — HEPARIN (PORCINE) 25000 UT/250ML-% IV SOLN
1000.0000 [IU]/h | INTRAVENOUS | Status: DC
  Administered 2023-04-26: 1100 [IU]/h via INTRAVENOUS
  Administered 2023-04-27: 1000 [IU]/h via INTRAVENOUS
  Filled 2023-04-26 (×2): qty 250

## 2023-04-26 MED ORDER — HYDROCORTISONE SOD SUC (PF) 100 MG IJ SOLR
100.0000 mg | Freq: Two times a day (BID) | INTRAMUSCULAR | Status: DC
  Administered 2023-04-26 – 2023-04-27 (×3): 100 mg via INTRAVENOUS
  Filled 2023-04-26 (×3): qty 2

## 2023-04-26 MED ORDER — INSULIN ASPART 100 UNIT/ML IJ SOLN
10.0000 [IU] | Freq: Once | INTRAMUSCULAR | Status: AC
  Administered 2023-04-26: 10 [IU] via INTRAVENOUS
  Filled 2023-04-26 (×2): qty 0.1

## 2023-04-26 MED ORDER — DEXTROSE 50 % IV SOLN
1.0000 | Freq: Once | INTRAVENOUS | Status: AC
  Administered 2023-04-26: 50 mL via INTRAVENOUS
  Filled 2023-04-26: qty 50

## 2023-04-26 MED ORDER — ALBUTEROL SULFATE (2.5 MG/3ML) 0.083% IN NEBU
INHALATION_SOLUTION | RESPIRATORY_TRACT | Status: AC
  Administered 2023-04-26: 7.5 mg via RESPIRATORY_TRACT
  Filled 2023-04-26: qty 9

## 2023-04-26 MED ORDER — ASPIRIN 81 MG PO CHEW
81.0000 mg | CHEWABLE_TABLET | Freq: Every day | ORAL | Status: DC
  Administered 2023-04-26: 81 mg
  Filled 2023-04-26: qty 1

## 2023-04-26 MED ORDER — THIAMINE HCL 100 MG/ML IJ SOLN
500.0000 mg | INTRAVENOUS | Status: AC
  Administered 2023-04-26 – 2023-04-28 (×3): 500 mg via INTRAVENOUS
  Filled 2023-04-26 (×3): qty 5

## 2023-04-26 MED ORDER — SODIUM CHLORIDE 0.9 % IV SOLN
500.0000 mg | INTRAVENOUS | Status: DC
  Administered 2023-04-26: 500 mg via INTRAVENOUS
  Filled 2023-04-26: qty 5

## 2023-04-26 MED ORDER — CHLORHEXIDINE GLUCONATE CLOTH 2 % EX PADS
6.0000 | MEDICATED_PAD | Freq: Every evening | CUTANEOUS | Status: DC
  Administered 2023-04-26 – 2023-05-20 (×25): 6 via TOPICAL

## 2023-04-26 MED ORDER — PIPERACILLIN-TAZOBACTAM 3.375 G IVPB
3.3750 g | Freq: Three times a day (TID) | INTRAVENOUS | Status: DC
  Administered 2023-04-26 (×2): 3.375 g via INTRAVENOUS
  Filled 2023-04-26 (×2): qty 50

## 2023-04-26 MED ORDER — ALBUTEROL (5 MG/ML) CONTINUOUS INHALATION SOLN
5.0000 mg/h | INHALATION_SOLUTION | RESPIRATORY_TRACT | Status: DC
  Filled 2023-04-26: qty 20

## 2023-04-26 MED ORDER — PANTOPRAZOLE SODIUM 40 MG IV SOLR
40.0000 mg | Freq: Every day | INTRAVENOUS | Status: DC
  Administered 2023-04-26 – 2023-05-01 (×6): 40 mg via INTRAVENOUS
  Filled 2023-04-26 (×6): qty 10

## 2023-04-26 MED ORDER — CALCIUM GLUCONATE-NACL 2-0.675 GM/100ML-% IV SOLN
2.0000 g | Freq: Once | INTRAVENOUS | Status: AC
  Administered 2023-04-26: 2000 mg via INTRAVENOUS
  Filled 2023-04-26: qty 100

## 2023-04-26 MED ORDER — STERILE WATER FOR INJECTION IV SOLN
INTRAVENOUS | Status: DC
  Filled 2023-04-26: qty 150

## 2023-04-26 MED ORDER — THIAMINE HCL 100 MG/ML IJ SOLN
100.0000 mg | Freq: Once | INTRAMUSCULAR | Status: AC
  Administered 2023-04-26: 100 mg via INTRAVENOUS
  Filled 2023-04-26: qty 2

## 2023-04-26 MED ORDER — THIAMINE HCL 100 MG/ML IJ SOLN
100.0000 mg | INTRAMUSCULAR | Status: DC
  Administered 2023-04-28 – 2023-05-05 (×8): 100 mg via INTRAVENOUS
  Filled 2023-04-26 (×8): qty 2

## 2023-04-26 MED ORDER — CALCIUM GLUCONATE-NACL 1-0.675 GM/50ML-% IV SOLN
1.0000 g | Freq: Once | INTRAVENOUS | Status: AC
  Administered 2023-04-26: 1000 mg via INTRAVENOUS
  Filled 2023-04-26: qty 50

## 2023-04-26 MED ORDER — MIDAZOLAM HCL 2 MG/2ML IJ SOLN
2.0000 mg | Freq: Once | INTRAMUSCULAR | Status: AC
  Administered 2023-04-26: 2 mg via INTRAVENOUS

## 2023-04-26 MED ORDER — INSULIN ASPART 100 UNIT/ML IJ SOLN
0.0000 [IU] | INTRAMUSCULAR | Status: DC
  Administered 2023-04-26: 2 [IU] via SUBCUTANEOUS
  Administered 2023-04-26 (×3): 3 [IU] via SUBCUTANEOUS
  Administered 2023-04-27: 2 [IU] via SUBCUTANEOUS
  Administered 2023-04-27: 3 [IU] via SUBCUTANEOUS
  Administered 2023-04-28 (×3): 2 [IU] via SUBCUTANEOUS
  Administered 2023-04-29 (×3): 3 [IU] via SUBCUTANEOUS
  Administered 2023-04-29 – 2023-05-07 (×27): 2 [IU] via SUBCUTANEOUS
  Filled 2023-04-26 (×40): qty 1

## 2023-04-26 MED ORDER — HEPARIN BOLUS VIA INFUSION
4000.0000 [IU] | Freq: Once | INTRAVENOUS | Status: AC
  Administered 2023-04-26: 4000 [IU] via INTRAVENOUS
  Filled 2023-04-26: qty 4000

## 2023-04-26 MED ORDER — VASOPRESSIN 20 UNITS/100 ML INFUSION FOR SHOCK
0.0000 [IU]/min | INTRAVENOUS | Status: DC
  Administered 2023-04-26 (×2): 0.03 [IU]/min via INTRAVENOUS
  Filled 2023-04-26 (×2): qty 100

## 2023-04-26 MED ORDER — SODIUM ZIRCONIUM CYCLOSILICATE 5 G PO PACK
10.0000 g | PACK | Freq: Two times a day (BID) | ORAL | Status: AC
  Administered 2023-04-26 (×2): 10 g
  Filled 2023-04-26 (×2): qty 2

## 2023-04-26 MED ORDER — FOLIC ACID 1 MG PO TABS
1.0000 mg | ORAL_TABLET | Freq: Every day | ORAL | Status: DC
  Administered 2023-04-26 – 2023-05-15 (×17): 1 mg
  Filled 2023-04-26 (×17): qty 1

## 2023-04-26 MED ORDER — SODIUM CHLORIDE 0.9 % IV SOLN
250.0000 mL | INTRAVENOUS | Status: AC
  Administered 2023-04-26: 250 mL via INTRAVENOUS

## 2023-04-26 MED ORDER — ASPIRIN 81 MG PO TBEC: 81.0000 mg | DELAYED_RELEASE_TABLET | Freq: Every day | ORAL | Status: DC

## 2023-04-26 MED ORDER — DOCUSATE SODIUM 50 MG/5ML PO LIQD
100.0000 mg | Freq: Two times a day (BID) | ORAL | Status: DC | PRN
  Administered 2023-04-26: 100 mg
  Filled 2023-04-26 (×2): qty 10

## 2023-04-26 MED ORDER — ORAL CARE MOUTH RINSE: 15.0000 mL | OROMUCOSAL | Status: DC | PRN

## 2023-04-26 MED ORDER — ADULT MULTIVITAMIN W/MINERALS CH
1.0000 | ORAL_TABLET | Freq: Every day | ORAL | Status: DC
  Administered 2023-04-26 – 2023-05-15 (×17): 1
  Filled 2023-04-26 (×17): qty 1

## 2023-04-26 MED ORDER — ORAL CARE MOUTH RINSE
15.0000 mL | OROMUCOSAL | Status: DC
  Administered 2023-04-26 – 2023-05-07 (×130): 15 mL via OROMUCOSAL

## 2023-04-26 MED ORDER — PROPOFOL 1000 MG/100ML IV EMUL
5.0000 ug/kg/min | INTRAVENOUS | Status: DC
  Administered 2023-04-26: 5 ug/kg/min via INTRAVENOUS
  Administered 2023-04-26: 30 ug/kg/min via INTRAVENOUS
  Administered 2023-04-27: 25 ug/kg/min via INTRAVENOUS
  Administered 2023-04-27 (×2): 30 ug/kg/min via INTRAVENOUS
  Administered 2023-04-28: 40 ug/kg/min via INTRAVENOUS
  Administered 2023-04-28 (×2): 25 ug/kg/min via INTRAVENOUS
  Administered 2023-04-29 (×3): 40 ug/kg/min via INTRAVENOUS
  Administered 2023-04-29: 50 ug/kg/min via INTRAVENOUS
  Administered 2023-04-29: 40 ug/kg/min via INTRAVENOUS
  Administered 2023-04-29: 50 ug/kg/min via INTRAVENOUS
  Administered 2023-04-30: 30 ug/kg/min via INTRAVENOUS
  Administered 2023-04-30 – 2023-05-01 (×4): 40 ug/kg/min via INTRAVENOUS
  Filled 2023-04-26 (×19): qty 100

## 2023-04-26 MED ORDER — ALBUTEROL SULFATE (2.5 MG/3ML) 0.083% IN NEBU: 7.5000 mg | INHALATION_SOLUTION | Freq: Once | RESPIRATORY_TRACT | Status: AC

## 2023-04-26 MED ORDER — SODIUM CHLORIDE 0.9 % IV SOLN
INTRAVENOUS | Status: AC
Start: 2023-04-26 — End: 2023-04-27

## 2023-04-26 MED ORDER — SODIUM CHLORIDE 0.9 % IV BOLUS
1000.0000 mL | Freq: Once | INTRAVENOUS | Status: AC
  Administered 2023-04-26: 1000 mL via INTRAVENOUS

## 2023-04-26 MED ORDER — THIAMINE MONONITRATE 100 MG PO TABS: 100.0000 mg | ORAL_TABLET | Freq: Every day | ORAL | Status: DC

## 2023-04-26 MED ORDER — DOXYCYCLINE HYCLATE 100 MG IV SOLR
100.0000 mg | Freq: Two times a day (BID) | INTRAVENOUS | Status: AC
  Administered 2023-04-27 – 2023-04-29 (×5): 100 mg via INTRAVENOUS
  Filled 2023-04-26 (×5): qty 100

## 2023-04-26 MED ORDER — PIPERACILLIN-TAZOBACTAM 4.5 G IVPB
4.5000 g | Freq: Three times a day (TID) | INTRAVENOUS | Status: DC
  Administered 2023-04-26 – 2023-04-27 (×2): 4.5 g via INTRAVENOUS
  Filled 2023-04-26 (×3): qty 100

## 2023-04-26 MED ORDER — FREE WATER
30.0000 mL | Status: DC
  Administered 2023-04-26 – 2023-05-04 (×42): 30 mL

## 2023-04-26 NOTE — TOC CM/SW Note (Signed)
TOC consulted for SA resources. SA resources added to AVS.  Alfonso Ramus, LCSW Transitions of Care Department [phone]

## 2023-04-26 NOTE — Procedures (Signed)
 CENTRAL VENOUS CATHETER INSERTION PROCEDURE  Hasaan Radde  [id number]  04-24-1955  Date:04/26/23  Time:5:41 AM   Provider Performing:Skye Rodarte A Kathrene   Procedure: Insertion of Non-tunneled Central Venous Catheter(36556) with US  guidance (23062)   Indication(s) Medication administration and Difficult access  Consent Unable to obtain consent due to emergent nature of procedure.  Anesthesia Topical only with 1% lidocaine    Timeout Verified patient identification, verified procedure, site/side was marked, verified correct patient position, special equipment/implants available, medications/allergies/relevant history reviewed, required imaging and test results available.  Sterile Technique Maximal sterile technique including full sterile barrier drape, hand hygiene, sterile gown, sterile gloves, mask, hair covering, sterile ultrasound probe cover (if used).  Procedure Description Area of catheter insertion was cleaned with chlorhexidine  and draped in sterile fashion.  With real-time ultrasound guidance a central venous catheter was placed into the left internal jugular vein. Nonpulsatile blood flow and easy flushing noted in all ports.  The catheter was sutured in place and sterile dressing applied.  Complications/Tolerance None; patient tolerated the procedure well. Chest X-ray is ordered to verify placement for internal jugular or subclavian cannulation.   Chest x-ray is not ordered for femoral cannulation.  EBL Minimal  Specimen(s) None  Almarie Kathrene, DNP, CCRN, FNP-C, AGACNP-BC Acute Care & Family Nurse Practitioner   Pulmonary & Critical Care  See Amion for personal pager PCCM on call pager [phone] until 7 am
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
## 2023-04-26 NOTE — Discharge Instructions (Signed)

## 2023-04-26 NOTE — Progress Notes (Addendum)
 PHARMACY CONSULT NOTE - FOLLOW UP  Pharmacy Consult for Electrolyte Monitoring and Replacement   Recent Labs: Potassium (mmol/L)  Date Value  04/26/2023 4.6  08/23/2013 3.3 (L)   Magnesium  (mg/dL)  Date Value  98/89/7974 2.0  08/23/2013 1.4 (L)   Calcium  (mg/dL)  Date Value  98/89/7974 7.1 (L)   Calcium , Total (mg/dL)  Date Value  94/89/7984 9.5   Albumin  (g/dL)  Date Value  98/90/7974 3.5   Phosphorus (mg/dL)  Date Value  98/89/7974 7.7 (H)  08/23/2013 2.2 (L)   Sodium (mmol/L)  Date Value  04/26/2023 140  08/23/2013 128 (L)     Assessment: 68 y/o male with h/o SBO secondary to strangulated inguinal hernia s/p repair 2018, etoh abuse and seizures who is admitted with AMS, aspiration pneumonia, sepsis, AKI and rhabdomyolysis. Pharmacy is asked to follow and replace electrolytes while in CCU  Nutrition: Vital AF + FWF 30 mL per tube every 4 hours  MIVF: sodium bicarbonate  150 mEq in sterile water  at 125 mL/hr   Goal of Therapy:  Electrolytes WNL  Plan:  ---2 grams IV calcium  gluconate x 1 ---recheck electrolytes in am  Curtis [name] ,PharmD Clinical Pharmacist 04/26/2023 7:03 AM

## 2023-04-26 NOTE — Progress Notes (Addendum)
 PHARMACY - ANTICOAGULATION CONSULT NOTE  Pharmacy Consult for heparin  infusion Indication: chest pain/ACS  No Known Allergies  Patient Measurements: Height: 5' 7 (170.2 cm) Weight: 71.7 kg (158 lb 1.1 oz) IBW/kg (Calculated) : 66.1 Heparin  Dosing Weight: 79 kg  Vital Signs: Temp: 98.8 F (37.1 C) (01/10 0828) Temp Source: Bladder (01/10 0600) BP: 129/50 (01/10 0600) Pulse Rate: 84 (01/10 0828)  Labs: Recent Labs    04/25/23 1819 04/26/23 0325 04/26/23 0753  HGB 13.3 12.3*  --   HCT 40.1 35.8*  --   PLT 205 193  --   APTT 35  --   --   LABPROT 17.2*  --   --   INR 1.4*  --   --   CREATININE 1.91* 1.79*  --   CKTOTAL 5,249*  --   --   TROPONINIHS 32*  --  573*    Estimated Creatinine Clearance: 36.4 mL/min (A) (by C-G formula based on SCr of 1.79 mg/dL (H)).   Medical History: Past Medical History:  Diagnosis Date   EtOH dependence (HCC)     Medications:  Scheduled:   aspirin   81 mg Per Tube Daily   Chlorhexidine  Gluconate Cloth  6 each Topical Nightly   folic acid   1 mg Per Tube Daily   hydrocortisone  sod succinate (SOLU-CORTEF ) inj  100 mg Intravenous Q12H   insulin  aspart  0-15 Units Subcutaneous Q4H   midazolam        multivitamin with minerals  1 tablet Per Tube Daily   pantoprazole  (PROTONIX ) IV  40 mg Intravenous QHS   [START ON 04/29/2023] thiamine  (VITAMIN B1) injection  100 mg Intravenous Q24H    Assessment: 68 y/o male with h/o SBO secondary to strangulated inguinal hernia s/p repair 2018, etoh abuse and seizures who is admitted with AMS, aspiration pneumonia, sepsis, AKI and rhabdomyolysis. Pharmacy is asked to start and adjust heparin  infusion ISO ACS. A review of records reveals no chronic anticoagulation prior to arrival  Goal of Therapy:  Heparin  level 0.3-0.7 units/ml Monitor platelets by anticoagulation protocol: Yes   Plan:  ---Give 4000 units bolus x 1 ---Start heparin  infusion at 1100 units/hr ---Check anti-Xa level in 8 hours and  at least once daily while on heparin  ---Continue to monitor H&H and platelets  Adriana JONETTA Bolster 04/26/2023,10:00 AM

## 2023-04-26 NOTE — Progress Notes (Signed)
*  PRELIMINARY RESULTS* Echocardiogram 2D Echocardiogram has been performed.  Cristela Blue 04/26/2023, 10:58 AM

## 2023-04-26 NOTE — Progress Notes (Signed)
 Initial Nutrition Assessment  DOCUMENTATION CODES:   Not applicable  INTERVENTION:   Vital 1.2@60ml /hr- Initiate at 25ml/hr and increase by 10ml/hr q 8 hours until goal rate is reached.   Free water  flushes 30ml q4 hours to maintain tube patency   Regimen provides 1728kcal/day, 108g/day protein and 1316ml/day of free water .   Pt at high refeed risk; recommend monitor potassium, magnesium  and phosphorus labs daily until stable  Continue MVI, folic acid  and thiamine    Daily weights   NUTRITION DIAGNOSIS:   Inadequate oral intake related to inability to eat (pt sedated and ventilated) as evidenced by NPO status.  GOAL:   Provide needs based on ASPEN/SCCM guidelines  MONITOR:   Vent status, Labs, Weight trends, TF tolerance, I & O's, Skin  REASON FOR ASSESSMENT:   Ventilator    ASSESSMENT:   68 y/o male with h/o SBO secondary to strangulated inguinal hernia s/p repair 2018, etoh abuse and seizures who is admitted with AMS, aspiration pneumonia, sepsis, AKI and  rhabdomyolysis.  Pt sedated and ventilated. OGT in place. Will plan to initiate tube feeds today. Pt is at high refeed risk. There is no recent documented weight history to determine if any significant changes.   Medications reviewed and include: aspirin , folic acid , solu-cortef , insulin , MVI, protonix , thiamine , azithromycin , heparin , levophed , zosyn , propofol , Na bicarbonate, thiamine , vasopressin     Labs reviewed: K 4.6 wnl, BUN 32(H), creat 1.79(H), P 7.7(H), Mg 2.0 wnl Cbgs- 169, 171, 157, 130, 84, 101 x 24 hrs   Patient is currently intubated on ventilator support MV: 10.9 L/min Temp (24hrs), Avg:91.8 F (33.2 C), Min:77 F (25 C), Max:99.1 F (37.3 C)  Propofol : 2.15 ml/hr  MAP- >   UOP-   NUTRITION - FOCUSED PHYSICAL EXAM:  Flowsheet Row Most Recent Value  Orbital Region No depletion  Upper Arm Region Moderate depletion  Thoracic and Lumbar Region No depletion  Buccal Region  No depletion  Temple Region Mild depletion  Clavicle Bone Region Mild depletion  Clavicle and Acromion Bone Region Mild depletion  Scapular Bone Region No depletion  Dorsal Hand Unable to assess  Patellar Region Severe depletion  Anterior Thigh Region Severe depletion  Posterior Calf Region Severe depletion  Edema (RD Assessment) None  Hair Reviewed  Eyes Reviewed  Mouth Reviewed  Skin Reviewed  Nails Reviewed   Diet Order:   Diet Order             Diet NPO time specified  Diet effective now                  EDUCATION NEEDS:   No education needs have been identified at this time  Skin:  Skin Assessment: Reviewed RN Assessment  Last BM:  1/9  Height:   Ht Readings from Last 1 Encounters:  04/25/23 5' 7 (1.702 m)    Weight:   Wt Readings from Last 1 Encounters:  04/26/23 71.7 kg    Ideal Body Weight:  67.2 kg  BMI:  Body mass index is 24.76 kg/m.  Estimated Nutritional Needs:   Kcal:  1742kcal/day  Protein:  110-120g/day  Fluid:  1.7-2.0L/day  Augustin Shams MS, RD, LDN If unable to be reached, please send secure chat to RD inpatient available from 8:00a-4:00p daily

## 2023-04-26 NOTE — Plan of Care (Signed)

## 2023-04-26 NOTE — Progress Notes (Signed)
 Pharmacy Antibiotic Note  Curtis [name] is a 68 y.o. male admitted on 04/25/2023 with possible aspiration pneumonia.  Pharmacy has been consulted for Zosyn  dosing.  Plan: Zosyn  3.375g IV q8h (4 hour infusion).  Pharmacy will continue to follow and will adjust abx dosing whenever warranted.  Temp (24hrs), Avg:87.5 F (30.8 C), Min:77 F (25 C), Max:96.1 F (35.6 C)   Recent Labs  Lab 04/25/23 1819 04/25/23 1820 04/25/23 2207 04/26/23 0325  WBC 15.7*  --   --  6.4  CREATININE 1.91*  --   --   --   LATICACIDVEN  --  >9.0* >9.0*  --     Estimated Creatinine Clearance: 34.1 mL/min (A) (by C-G formula based on SCr of 1.91 mg/dL (H)).    No Known Allergies  Antimicrobials this admission: 1/09 Ceftriaxone  >> x 1 dose 1/10 Zosyn  >>  1/10 Azithromycin  >>   Microbiology results: 1/09 BCx: Pending  Thank you for allowing pharmacy to be a part of this patient's care.  Rankin CANDIE Dills, PharmD, MBA 04/26/2023 4:08 AM

## 2023-04-26 NOTE — Consult Note (Signed)
 Cardiology Consult    Patient ID: Vinay Ertl MRN: [id number], DOB/AGE: 18-Feb-1956   Admit date: 04/25/2023 Date of Consult: 04/26/2023  Primary Physician: Patient, No Pcp Per Primary Cardiologist: Lonni Hanson, MD - new Requesting Provider: LOIS November, MD  Patient Profile    Dontavius Keim is a 68 y.o. male with a history of EtOH abuse, who is being seen today for the evaluation of troponin elevation in the setting of hypothermia, lactic acidosis, and resp failure at the request of Dr. troponin.  Past Medical History  Subjective  Past Medical History:  Diagnosis Date   EtOH dependence (HCC)    History of echocardiogram    a. 04/2023 Echo: EF >55%, mildly reduced RV fxn, triv MR.   Incarcerated inguinal hernia    a. 10/2016 s/p repair.    Past Surgical History:  Procedure Laterality Date   INGUINAL HERNIA REPAIR Right 11/01/2016   Procedure: HERNIA REPAIR INGUINAL INCARCERATED;  Surgeon: Jordis Laneta FALCON, MD;  Location: [facility] ORS;  Service: General;  Laterality: Right;   NO PAST SURGERIES       Allergies  No Known Allergies    History of Present Illness   68 y/o ? w/a h/o alcoholism.  He is currently intubated and sedated and unable to provide further history.  Notes indicate that on 1/9, he was found by a neighbor in his unheated home, unresponsive.  Notes indicate that EMS were unable to obtain a temp or SpO2 due to hypothermia.  Initial CBG 34.  He was transported to the ED where on arrival, Temp 38F.  BP 100/71.  SpO2 100% on NRB.  Initial pH 7.07.  He was intubated for airway protection.  ECG sinus bradycardia, 53, 1st deg AVB, IVCD, and prolonged QT (QTc 596).  Labs notable for Na 131, Cl 92, CO2 16, BUN/Creat elev above prior baseline @ 31/1.91, AST/ALT 284/83, lactic acid >9.0, CK 5249, hsTrop 32  573  665.  Covid neg.  Ethyl alcohol 177.  Inpatient Medications  Subjective    aspirin   81 mg Per Tube Daily   Chlorhexidine  Gluconate Cloth  6 each Topical Nightly    dextrose   1 ampule Intravenous Once   folic acid   1 mg Per Tube Daily   free water   30 mL Per Tube Q4H   hydrocortisone  sod succinate (SOLU-CORTEF ) inj  100 mg Intravenous Q12H   insulin  aspart  0-15 Units Subcutaneous Q4H   midazolam        multivitamin with minerals  1 tablet Per Tube Daily   pantoprazole  (PROTONIX ) IV  40 mg Intravenous QHS   sodium zirconium cyclosilicate   10 g Per Tube BID   [START ON 04/29/2023] thiamine  (VITAMIN B1) injection  100 mg Intravenous Q24H    Family History - unable to obtain due to sedated status    Family History  Family history unknown: Yes   has no family status information on file.    Social History - unable to obtain due to sedated status    Social History   Socioeconomic History   Marital status: Single    Spouse name: Not on file   Number of children: Not on file   Years of education: Not on file   Highest education level: Not on file  Occupational History   Not on file  Tobacco Use   Smoking status: Every Day    Current packs/day: 1.00    Types: Cigarettes   Smokeless tobacco: Current    Types: Chew  Vaping  Use   Vaping status: Never Used  Substance and Sexual Activity   Alcohol use: Yes    Alcohol/week: 126.0 standard drinks of alcohol    Types: 126 Cans of beer per week    Comment: pt repors he drinks 5 or 6 beers a day.    Drug use: No   Sexual activity: Not on file  Other Topics Concern   Not on file  Social History Narrative   Not on file   Social Drivers of Health   Financial Resource Strain: Not on file  Food Insecurity: Not on file  Transportation Needs: Not on file  Physical Activity: Not on file  Stress: Not on file  Social Connections: Not on file  Intimate Partner Violence: Not on file     Review of Systems - unable to obtain due to sedated status    Currently intubated/sedated    Objective  Physical Exam    Blood pressure (!) 157/57, pulse 71, temperature 98.4 F (36.9 C), resp. rate (!) 22,  height 5' 7 (1.702 m), weight 71.7 kg, SpO2 95%.  General: Intubated/sedated. Psych: Sedated. Neuro: Sedated/unresponsive. HEENT: Normal  Neck: Supple without bruits or JVD. Lungs:  Resp regular and unlabored, coarse breath sounds bilat. Heart: RRR no s3, s4, or murmurs. Abdomen: Soft, non-tender, non-distended, BS + x 4.  Extremities: No clubbing, cyanosis or edema. DP/PT2+, Radials 2+ and equal bilaterally.  Labs    Cardiac Enzymes Recent Labs  Lab 04/25/23 1819 04/26/23 0753 04/26/23 1052  TROPONINIHS 32* 573* 665*     BNP    Component Value Date/Time   BNP 52 08/23/2013 1819    Lab Results  Component Value Date   WBC 6.4 04/26/2023   HGB 12.3 (L) 04/26/2023   HCT 35.8 (L) 04/26/2023   MCV 97.5 04/26/2023   PLT 193 04/26/2023    Recent Labs  Lab 04/25/23 1819 04/26/23 0325 04/26/23 1052  NA 131*   < > 136  K 4.8   < > 5.7*  CL 92*   < > 96*  CO2 16*   < > 21*  BUN 31*   < > 40*  CREATININE 1.91*   < > 2.37*  CALCIUM  8.3*   < > 6.2*  PROT 6.5  --   --   BILITOT 1.1  --   --   ALKPHOS 125  --   --   ALT 83*  --   --   AST 284*  --   --   GLUCOSE 286*   < > 190*   < > = values in this interval not displayed.    Radiology Studies    DG Chest Port 1 View Result Date: 04/26/2023 CLINICAL DATA:  Central line placement. EXAM: PORTABLE CHEST 1 VIEW COMPARISON:  04/25/2023 FINDINGS: Endotracheal tube tip is approximately 3.9 cm above the base of the carina. NG tube tip is in the stomach. Left IJ central line tip overlies the mid to distal SVC level. The lungs are clear without focal pneumonia, edema, pneumothorax or pleural effusion. Interstitial markings are diffusely coarsened with chronic features. The cardiopericardial silhouette is within normal limits for size. Telemetry leads overlie the chest. IMPRESSION: 1. Left IJ central line tip overlies the mid to distal SVC level. No pneumothorax. 2. Endotracheal tube tip is approximately 3.9 cm above the base of the  carina. Electronically Signed   By: Camellia Candle M.D.   On: 04/26/2023 05:55   CT CHEST ABDOMEN PELVIS WO CONTRAST Result  Date: 04/25/2023 CLINICAL DATA:  Sepsis unresponsive EXAM: CT CHEST, ABDOMEN AND PELVIS WITHOUT CONTRAST TECHNIQUE: Multidetector CT imaging of the chest, abdomen and pelvis was performed following the standard protocol without IV contrast. RADIATION DOSE REDUCTION: This exam was performed according to the departmental dose-optimization program which includes automated exposure control, adjustment of the mA and/or kV according to patient size and/or use of iterative reconstruction technique. COMPARISON:  Chest x-ray 04/25/2023 FINDINGS: CT CHEST FINDINGS Cardiovascular: Limited assessment without intravenous contrast. Moderate aortic atherosclerosis. No aneurysm. Normal cardiac size. No pericardial effusion. Mediastinum/Nodes: Endotracheal tube tip is just above the carina. Enteric tube tip in the gastric fundus. No suspicious lymph nodes. Lungs/Pleura: Emphysema. Multifocal bilateral ground-glass densities consistent with respiratory infection/pneumonia. 4 mm right upper lobe pulmonary nodule on series 4, image 76. There are other small pulmonary nodules noted. Diffuse bilateral bronchial wall thickening. Mild mucous plugging in the right lower lobe. Musculoskeletal: No acute or suspicious osseous abnormality CT ABDOMEN PELVIS FINDINGS Hepatobiliary: Hepatic steatosis. Diffuse increased density in the gallbladder without calcified stone. No biliary dilatation Pancreas: Unremarkable. No pancreatic ductal dilatation or surrounding inflammatory changes. Spleen: Normal in size without focal abnormality. Adrenals/Urinary Tract: Adrenal glands are within normal limits. Kidneys show no hydronephrosis. Bladder is decompressed by Foley catheter. Stomach/Bowel: Moderate air distension of the stomach. Mild air and fluid-filled small bowel without obstruction or acute bowel wall thickening. Negative  appendix. Vascular/Lymphatic: Advanced aortic atherosclerosis. No aneurysm. No suspicious lymph nodes Reproductive: Negative prostate Other: Negative for pelvic effusion or free air. Large right inguinal hernia containing mesenteric fat and small bowel but no obstruction or incarceration. Musculoskeletal: Degenerative changes at L5-S1. No acute osseous abnormality. IMPRESSION: 1. Diffuse bilateral bronchial wall thickening with mild mucous plugging in the right lower lobe. Scattered bilateral ground-glass opacities consistent with respiratory infection/pneumonia, to include atypical organisms. 2. Emphysema. 3. Hepatic steatosis. 4. Large right inguinal hernia containing mesenteric fat and small bowel but no obstruction or incarceration. 5. Aortic atherosclerosis. 6. 4 mm right upper lobe pulmonary nodule. No follow-up needed if patient is low-risk (and has no known or suspected primary neoplasm). Non-contrast chest CT can be considered in 12 months if patient is high-risk. This recommendation follows the consensus statement: Guidelines for Management of Incidental Pulmonary Nodules Detected on CT Images: From the Fleischner Society 2017; Radiology 2017; 284:228-243. Aortic Atherosclerosis (ICD10-I70.0) and Emphysema (ICD10-J43.9). Electronically Signed   By: Luke Bun M.D.   On: 04/25/2023 21:39   CT Cervical Spine Wo Contrast Result Date: 04/25/2023 CLINICAL DATA:  Found unresponsive EXAM: CT CERVICAL SPINE WITHOUT CONTRAST TECHNIQUE: Multidetector CT imaging of the cervical spine was performed without intravenous contrast. Multiplanar CT image reconstructions were also generated. RADIATION DOSE REDUCTION: This exam was performed according to the departmental dose-optimization program which includes automated exposure control, adjustment of the mA and/or kV according to patient size and/or use of iterative reconstruction technique. COMPARISON:  None Available. FINDINGS: Alignment: Within normal limits. Skull  base and vertebrae: 7 cervical segments are well visualized. Extensive disc space narrowing and osteophytic changes are seen. No acute fracture or acute facet abnormality is noted. Degenerative facet hypertrophic changes are noted as well. Soft tissues and spinal canal: Surrounding soft tissue structures are within normal limits. Endotracheal tube and gastric catheter are seen in satisfactory position. Upper chest: Visualized lung apices are unremarkable. Other: None IMPRESSION: Extensive osteophytic and facet hypertrophic changes are noted without acute abnormality. Electronically Signed   By: Oneil Devonshire M.D.   On: 04/25/2023 21:26   CT  Head Wo Contrast Result Date: 04/25/2023 CLINICAL DATA:  Mental status change, persistent or worsening. Found unresponsive EXAM: CT HEAD WITHOUT CONTRAST TECHNIQUE: Contiguous axial images were obtained from the base of the skull through the vertex without intravenous contrast. RADIATION DOSE REDUCTION: This exam was performed according to the departmental dose-optimization program which includes automated exposure control, adjustment of the mA and/or kV according to patient size and/or use of iterative reconstruction technique. COMPARISON:  05/14/2017 FINDINGS: Brain: Mild age related volume loss. Mild chronic small vessel disease throughout the deep white matter. No acute intracranial abnormality. Specifically, no hemorrhage, hydrocephalus, mass lesion, acute infarction, or significant intracranial injury. Vascular: No hyperdense vessel or unexpected calcification. Skull: No acute calvarial abnormality. Sinuses/Orbits: Mucosal thickening throughout the paranasal sinuses. 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 This is unchanged since prior study. Other: None IMPRESSION: Atrophy, chronic microvascular disease. No acute intracranial abnormality. Chronic sinusitis. Electronically Signed   By: Franky Crease M.D.   On: 04/25/2023 21:24   DG Chest Port 1 View Result  Date: 04/25/2023 CLINICAL DATA:  Questionable sepsis. EXAM: PORTABLE CHEST 1 VIEW COMPARISON:  Chest x-ray 06/21/2016 FINDINGS: Endotracheal tube tip is 2.6 cm above the carina. The cardiomediastinal silhouette is within normal limits. The lungs are clear. There is no pleural effusion or pneumothorax. No acute fractures are seen. IMPRESSION: 1. Endotracheal tube tip is 2.6 cm above the carina. 2. No acute cardiopulmonary process. Electronically Signed   By: Greig Pique M.D.   On: 04/25/2023 18:53      ECG & Cardiac Imaging    Sinus bradycardia, 53, 1st deg AVB, IVCD, and prolonged QT (QTc 596) - personally reviewed.  Assessment & Plan    1.  Demand Ischemia:  Pt admitted w/ unresponsiveness in the setting of profound hypothermia, hypoglycemia, hypotension, and alcohol intoxication (EtOH 177).  Presenting ECG w/ IVCD, 1st deg AVB, and prolonged QT.  Trops elevated @ 32  573  665.  CK 5249.  Echo performed this AM shows nl LV/RV fxn w/o significant valvular dzs.  Troponin elevation most consistent with demand ischemia.  Continue to trend troponin to peak.  Cont heparin  x 48 hrs.  Cont asa.  Pressures currently better but have been soft.  Will look to add ? blocker.  F/u lipids and consider statin once taking POs.  Consider outpt ischemic evaluation for risk stratification.  2.  Hypothermia:  mgmt per critical care.  3.  Hypoglycemia/hyperglycemia:  in setting of unresponsiveness/alcoholism/hypothermia.  Gluc now high.  A1c pending. Per CCM.  4.  Hyperkalemia:  K 5.7.  Ca gluconate, insulin , D50 provided earlier.  Risk Assessment/Risk Scores:     TIMI Risk Score for Unstable Angina or Non-ST Elevation MI:   The patient's TIMI risk score is 2, which indicates a 8% risk of all cause mortality, new or recurrent myocardial infarction or need for urgent revascularization in the next 14 days.      Signed, Lonni Meager, NP 04/26/2023, 1:34 PM  For questions or updates, please contact   Please  consult [email or web address] for contact info under Cardiology/STEMI.

## 2023-04-26 NOTE — Progress Notes (Addendum)
 PHARMACY CONSULT NOTE - FOLLOW UP  Pharmacy Consult for Electrolyte Monitoring and Replacement   Recent Labs: Potassium (mmol/L)  Date Value  04/26/2023 5.7 (H)  08/23/2013 3.3 (L)   Magnesium  (mg/dL)  Date Value  98/89/7974 2.0  08/23/2013 1.4 (L)   Calcium  (mg/dL)  Date Value  98/89/7974 6.2 (LL)   Calcium , Total (mg/dL)  Date Value  94/89/7984 9.5   Albumin  (g/dL)  Date Value  98/90/7974 3.5   Phosphorus (mg/dL)  Date Value  98/89/7974 7.7 (H)  08/23/2013 2.2 (L)   Sodium (mmol/L)  Date Value  04/26/2023 136  08/23/2013 128 (L)     Assessment: 68 y/o male with h/o SBO secondary to strangulated inguinal hernia s/p repair 2018, etoh abuse and seizures who is admitted with AMS, aspiration pneumonia, sepsis, AKI and rhabdomyolysis. Pharmacy is asked to follow and replace electrolytes while in CCU  Nutrition: Vital AF + FWF 30 mL per tube every 4 hours  MIVF: sodium bicarbonate  150 mEq in sterile water  at 125 mL/hr   Goal of Therapy:  Electrolytes WNL  Plan:  ---sodium zirconium cyclosilicate  10 grams per tube x 2 per NP ---10 units IV insulin  + D50 50ml x 1 --albuterol  continuous neb per NP ---repeat BMP 1600 ---recheck all electrolytes in am  Adriana JONETTA Bolster ,PharmD Clinical Pharmacist 04/26/2023 12:49 PM

## 2023-04-26 NOTE — Progress Notes (Signed)
 PHARMACY - ANTICOAGULATION CONSULT NOTE  Pharmacy Consult for heparin  infusion Indication: chest pain/ACS  No Known Allergies  Patient Measurements: Height: 5' 7 (170.2 cm) Weight: 71.7 kg (158 lb 1.1 oz) IBW/kg (Calculated) : 66.1 Heparin  Dosing Weight: 79 kg  Vital Signs: Temp: 97.9 F (36.6 C) (01/10 1915) Temp Source: Bladder (01/10 1500) BP: 133/49 (01/10 1915) Pulse Rate: 82 (01/10 1915)  Labs: Recent Labs    04/25/23 1819 04/26/23 0325 04/26/23 0753 04/26/23 1052 04/26/23 1705 04/26/23 2028  HGB 13.3 12.3*  --   --   --   --   HCT 40.1 35.8*  --   --   --   --   PLT 205 193  --   --   --   --   APTT 35  --   --   --   --   --   LABPROT 17.2*  --   --   --   --   --   INR 1.4*  --   --   --   --   --   HEPARINUNFRC  --   --   --   --   --  0.77*  CREATININE 1.91* 1.79*  --  2.37* 2.64*  --   CKTOTAL 5,249*  --   --  6,344*  --   --   TROPONINIHS 32*  --  573* 665*  --   --     Estimated Creatinine Clearance: 24.7 mL/min (A) (by C-G formula based on SCr of 2.64 mg/dL (H)).   Medical History: Past Medical History:  Diagnosis Date   EtOH dependence (HCC)    History of echocardiogram    a. 04/2023 Echo: EF >55%, mildly reduced RV fxn, triv MR.   Incarcerated inguinal hernia    a. 10/2016 s/p repair.    Medications:  Scheduled:   aspirin   81 mg Per Tube Daily   Chlorhexidine  Gluconate Cloth  6 each Topical Nightly   folic acid   1 mg Per Tube Daily   free water   30 mL Per Tube Q4H   hydrocortisone  sod succinate (SOLU-CORTEF ) inj  100 mg Intravenous Q12H   insulin  aspart  0-15 Units Subcutaneous Q4H   multivitamin with minerals  1 tablet Per Tube Daily   mouth rinse  15 mL Mouth Rinse Q2H   pantoprazole  (PROTONIX ) IV  40 mg Intravenous QHS   sodium zirconium cyclosilicate   10 g Per Tube BID   [START ON 04/29/2023] thiamine  (VITAMIN B1) injection  100 mg Intravenous Q24H    Assessment: 68 y/o male with h/o SBO secondary to strangulated inguinal hernia  s/p repair 2018, etoh abuse and seizures who is admitted with AMS, aspiration pneumonia, sepsis, AKI and rhabdomyolysis. Pharmacy is asked to start and adjust heparin  infusion ISO ACS. A review of records reveals no chronic anticoagulation prior to arrival  Goal of Therapy:  Heparin  level 0.3-0.7 units/ml Monitor platelets by anticoagulation protocol: Yes  Date Time HL Rate/Comment 1/10 2028 0.77 Supratherapeutic  Plan:  ---Decrease heparin  infusion to 1000 units/hr ---Check anti-Xa level in 8 hours and at least once daily while on heparin  ---Continue to monitor H&H and platelets  Will M. Lenon, PharmD Clinical Pharmacist 04/26/2023 9:11 PM

## 2023-04-26 NOTE — Plan of Care (Signed)
   Problem: Clinical Measurements: Goal: Diagnostic test results will improve Outcome: Progressing   Problem: Nutrition: Goal: Adequate nutrition will be maintained Outcome: Progressing

## 2023-04-26 NOTE — Progress Notes (Signed)
 PHARMACY - PHYSICIAN COMMUNICATION CRITICAL VALUE ALERT - BLOOD CULTURE IDENTIFICATION (BCID)  Curtis [name] is an 68 y.o. male who presented to [facility] on 04/25/2023 with a chief complaint of unresponsiveness.   Assessment:  1/4 bottles (aerobic) GPC on slide, but BCID showed nothing. The lab is going to send to  for further follow-up    Name of physician (or Provider) Contacted: Almarie Nose, NP  Current antibiotics: Doxycycline , Zosyn   Changes to prescribed antibiotics recommended:  Patient is on recommended antibiotics - No changes needed  Results for orders placed or performed during the hospital encounter of 04/25/23  Blood Culture ID Panel (Reflexed) (Collected: 04/25/2023  6:38 PM)  Result Value Ref Range   Enterococcus faecalis NOT DETECTED NOT DETECTED   Enterococcus Faecium NOT DETECTED NOT DETECTED   Listeria monocytogenes NOT DETECTED NOT DETECTED   Staphylococcus species NOT DETECTED NOT DETECTED   Staphylococcus aureus (BCID) NOT DETECTED NOT DETECTED   Staphylococcus epidermidis NOT DETECTED NOT DETECTED   Staphylococcus lugdunensis NOT DETECTED NOT DETECTED   Streptococcus species NOT DETECTED NOT DETECTED   Streptococcus agalactiae NOT DETECTED NOT DETECTED   Streptococcus pneumoniae NOT DETECTED NOT DETECTED   Streptococcus pyogenes NOT DETECTED NOT DETECTED   A.calcoaceticus-baumannii NOT DETECTED NOT DETECTED   Bacteroides fragilis NOT DETECTED NOT DETECTED   Enterobacterales NOT DETECTED NOT DETECTED   Enterobacter cloacae complex NOT DETECTED NOT DETECTED   Escherichia coli NOT DETECTED NOT DETECTED   Klebsiella aerogenes NOT DETECTED NOT DETECTED   Klebsiella oxytoca NOT DETECTED NOT DETECTED   Klebsiella pneumoniae NOT DETECTED NOT DETECTED   Proteus species NOT DETECTED NOT DETECTED   Salmonella species NOT DETECTED NOT DETECTED   Serratia marcescens NOT DETECTED NOT DETECTED   Haemophilus influenzae NOT DETECTED NOT DETECTED   Neisseria  meningitidis NOT DETECTED NOT DETECTED   Pseudomonas aeruginosa NOT DETECTED NOT DETECTED   Stenotrophomonas maltophilia NOT DETECTED NOT DETECTED   Candida albicans NOT DETECTED NOT DETECTED   Candida auris NOT DETECTED NOT DETECTED   Candida glabrata NOT DETECTED NOT DETECTED   Candida krusei NOT DETECTED NOT DETECTED   Candida parapsilosis NOT DETECTED NOT DETECTED   Candida tropicalis NOT DETECTED NOT DETECTED   Cryptococcus neoformans/gattii NOT DETECTED NOT DETECTED    Lum VEAR Mania, PharmD Clinical Pharmacist  04/26/2023  10:03 PM

## 2023-04-26 NOTE — H&P (Signed)
 NAME:  Curtis [name], MRN:  [id number], DOB:  07/16/1955, LOS: 1 ADMISSION DATE:  04/25/2023, CONSULTATION DATE:  04/25/23 REFERRING MD: Dicky Anes CHIEF COMPLAINT:  Unresponsiveness   HPI  68 y.o male with significant PMH of incarcerated inguinal hernia s/p repair, SBO, alcohol withdrawal seizure, EtOH abuse who presented to the ED with chief complaints of unresponsiveness.  Per ED report, EMS was dispatched to patient's residence by a neighbor who found the patient unconscious.  On EMS arrival, patient was found unresponsive on floor of bedroom. Neighbor report LKNW was 3 days ago. Patient was initially combative and would tense resist any treatment. EMS report they were unable to obtain a temperature or spo2 due to hypothermia and unable to obtain bp due to combative resistance. patient initial cbg registered at 63 treated with 1mg  glucagon repeat reading were in the upper 20's   ED Course: Initial vital signs showed HR of 55 beats/minute, BP 99/47 mm Hg, the RR 22 breaths/minute, and the oxygen saturation 100% on NRB and a temperature of 62F (25C). Patient intubated in for airway protection. Pertinent Labs/Diagnostics Findings: Na+/ K+:131/4.8  Glucose: 286 BUN/Cr.: 31/1.91 CO2 1, Anion Gap 23, AST/ALT:284/83 WBC: 15.7K/L with neutrophil predominance  Lactic acid: >9.0 COVID PCR: Negative,  troponin: 32  CK 5249 ETOH:177 VBG: pO2 46.2; pCO2 72; pH 6.96;  HCO3 16.2, %O2 Sat 46.6.  CXR> CTH> CTA Chest> CT Abd/pelvis>see results below Medication administered in the ZI:Ejupzwu given 30 cc/kg of fluids and started on broad-spectrum antibiotics with Ceftriaxone  for suspected sepsis with septic shock. Disposition:ICU  Past Medical History  incarcerated inguinal hernia s/p repair, SBO, alcohol withdrawal seizure, EtOH abuse   Significant Hospital Events   1/9: Admit to ICU   Consults:    Procedures:  1/9: Intubation  Significant Diagnostic Tests:  01/9: Chest  Xray> IMPRESSION: 1. Endotracheal tube tip is 2.6 cm above the carina. 2. No acute cardiopulmonary process.  01/9: Noncontrast CT head> IMPRESSION: Atrophy, chronic microvascular disease. No acute intracranial abnormality. Chronic sinusitis.  01/9: CTA Chest, abdomen and pelvis> MPRESSION: 1. Diffuse bilateral bronchial wall thickening with mild mucous plugging in the right lower lobe. Scattered bilateral ground-glass opacities consistent with respiratory infection/pneumonia, to include atypical organisms. 2. Emphysema. 3. Hepatic steatosis. 4. Large right inguinal hernia containing mesenteric fat and small bowel but no obstruction or incarceration. 5. Aortic atherosclerosis. 6. 4 mm right upper lobe pulmonary nodule. No follow-up needed if patient is low-risk (and has no known or suspected primary neoplasm). Non-contrast chest CT can be considered in 12 months if patient is high-risk. This recommendation follows the consensus statement: Guidelines for Management of Incidental Pulmonary Nodules Detected on CT Images: From the Fleischner Society 2017; Radiology 2017; 284:228-243.  Interim History / Subjective:      Micro Data:  1/9: SARS-CoV-2 PCR> negative 1/9: Influenza PCR> negative 1/9: Blood culture x2> 1/9: MRSA PCR>>  1/9: Strep pneumo urinary antigen> 1/9: Legionella urinary antigen>  Antimicrobials:  Azithromycin  1/9> Zosyn  1/9>  OBJECTIVE  Blood pressure (!) 110/50, pulse 83, temperature (!) 91.4 F (33 C), temperature source Bladder, resp. rate (!) 22, height 5' 7 (1.702 m), weight 71.7 kg, SpO2 97%.    Vent Mode: PRVC FiO2 (%):  [40 %] 40 % Set Rate:  [15 bmp-22 bmp] 22 bmp Vt Set:  [500 mL] 500 mL PEEP:  [8 cmH20] 8 cmH20   Intake/Output Summary (Last 24 hours) at 04/26/2023 0208 Last data filed at 04/25/2023 2130 Gross per 24 hour  Intake  2600 ml  Output --  Net 2600 ml   Filed Weights   04/25/23 1814 04/25/23 2200  Weight: 79 kg 71.7 kg    Physical Examination  GENERAL: 68 year-old critically ill patient lying in the bed intubated and sedated EYES: PEERLA. No scleral icterus. Extraocular muscles intact.  HEENT: Head atraumatic, normocephalic. Oropharynx and nasopharynx clear.  NECK:  No JVD, supple  LUNGS: Decreased breath sounds bilaterally.  No use of accessory muscles of respiration.  CARDIOVASCULAR: S1, S2 normal. No murmurs, rubs, or gallops.  ABDOMEN: Soft, NTND EXTREMITIES: No swelling or erythema.  Capillary refill < 3 seconds in all extremities. Pulses palpable distally. NEUROLOGIC: The patient is intubated and sedated. No focal neurological deficit appreciated. Cranial nerves are intact.  SKIN: No obvious rash, lesion, or ulcer. Warm to touch Labs/imaging that I havepersonally reviewed  (right click and Reselect all SmartList Selections daily)   EKG reviewed as below with Tammy waves likely in the setting of severe hypothermia Also with prolonged Qtc.    Labs   CBC: Recent Labs  Lab 04/25/23 1819  WBC 15.7*  NEUTROABS 13.5*  HGB 13.3  HCT 40.1  MCV 99.0  PLT 205    Basic Metabolic Panel: Recent Labs  Lab 04/25/23 1819  NA 131*  K 4.8  CL 92*  CO2 16*  GLUCOSE 286*  BUN 31*  CREATININE 1.91*  CALCIUM  8.3*   GFR: Estimated Creatinine Clearance: 34.1 mL/min (A) (by C-G formula based on SCr of 1.91 mg/dL (H)). Recent Labs  Lab 04/25/23 1819 04/25/23 1820 04/25/23 2207  WBC 15.7*  --   --   LATICACIDVEN  --  >9.0* >9.0*    Liver Function Tests: Recent Labs  Lab 04/25/23 1819  AST 284*  ALT 83*  ALKPHOS 125  BILITOT 1.1  PROT 6.5  ALBUMIN  3.5   No results for input(s): LIPASE, AMYLASE in the last 168 hours. No results for input(s): AMMONIA in the last 168 hours.  ABG    Component Value Date/Time   PHART 7.07 (LL) 04/25/2023 2132   PCO2ART 32 04/25/2023 2132   PO2ART 134 (H) 04/25/2023 2132   HCO3 9.3 (L) 04/25/2023 2132   ACIDBASEDEF 19.8 (H) 04/25/2023 2132    O2SAT 99.6 04/25/2023 2132     Coagulation Profile: Recent Labs  Lab 04/25/23 1819  INR 1.4*    Cardiac Enzymes: Recent Labs  Lab 04/25/23 1819  CKTOTAL 5,249*    HbA1C: No results found for: HGBA1C  CBG: Recent Labs  Lab 04/25/23 1819 04/25/23 2020 04/25/23 2330 04/26/23 0105  GLUCAP 205* 109* 82 101*    Review of Systems:   Unable to be obtained secondary to the patient's intubated and sedated status.   Past Medical History  He,  has a past medical history of EtOH dependence (HCC).   Surgical History    Past Surgical History:  Procedure Laterality Date   INGUINAL HERNIA REPAIR Right 11/01/2016   Procedure: HERNIA REPAIR INGUINAL INCARCERATED;  Surgeon: Jordis Laneta FALCON, MD;  Location: [facility] ORS;  Service: General;  Laterality: Right;   NO PAST SURGERIES       Social History   reports that he has been smoking cigarettes. His smokeless tobacco use includes chew. He reports current alcohol use of about 126.0 standard drinks of alcohol per week. He reports that he does not use drugs.   Family History   His Family history is unknown by patient.   Allergies No Known Allergies   Home Medications  Prior to Admission medications   Medication Sig Start Date End Date Taking? Authorizing Provider  baclofen  (LIORESAL ) 10 MG tablet Take 1 tablet (10 mg total) by mouth 3 (three) times daily as needed for muscle spasms. Patient not taking: Reported on 04/25/2023 02/06/22   Arvis Huxley B, PA-C  chlordiazePOXIDE  (LIBRIUM ) 5 MG capsule Take 1 capsule (5 mg total) by mouth 3 (three) times daily. Patient not taking: Reported on 11/02/2016 06/28/16   Yisroel Sleight, MD  folic acid  (FOLVITE ) 1 MG tablet Take 1 tablet (1 mg total) by mouth daily. Patient not taking: Reported on 11/02/2016 06/25/16   Laurence Bridegroom, MD  Multiple Vitamin (MULTIVITAMIN WITH MINERALS) TABS tablet Take 1 tablet by mouth daily. Patient not taking: Reported on 11/02/2016 06/25/16   Laurence Bridegroom, MD  thiamine   100 MG tablet Take 1 tablet (100 mg total) by mouth daily. Patient not taking: Reported on 11/02/2016 06/25/16   Laurence Bridegroom, MD  Scheduled Meds:  famotidine   20 mg Per Tube BID   folic acid   1 mg Per Tube Daily   hydrocortisone  sod succinate (SOLU-CORTEF ) inj  100 mg Intravenous Q12H   midazolam        multivitamin with minerals  1 tablet Per Tube Daily   thiamine   100 mg Per Tube Daily   Continuous Infusions:  sodium chloride  10 mL/hr at 04/26/23 0413   azithromycin      dexmedetomidine  (PRECEDEX ) IV infusion     fentaNYL  infusion INTRAVENOUS 100 mcg/hr (04/26/23 0413)   midazolam  2 mg/hr (04/26/23 0413)   norepinephrine  (LEVOPHED ) Adult infusion 8 mcg/min (04/26/23 0413)   piperacillin -tazobactam (ZOSYN )  IV     sodium bicarbonate  150 mEq in dextrose  5 % 1,150 mL infusion 125 mL/hr at 04/26/23 0413   vasopressin  0.03 Units/min (04/26/23 0413)   PRN Meds:.docusate sodium , midazolam , midazolam , polyethylene glycol  Active Hospital Problem list   See systems below  Assessment & Plan:  #Acute Hypoxic Respiratory Failure #Aspiration Pneumonia #Mucus Plugging -full mechanical support 6-8cc/kg/Vt  -titrate FiO2, PEEP to maintain O2 sat >90%  -Lung protective ventilation  -PRN Chest X-ray & ABG -PRN and scheduled bronchodilators -Start systemic steroid -SAT/SBT when appropriate  -prn fentanyl ,versed  for RASS -1    #Sepsis due to Multifocal Pneumonia Initial interventions/workup included: 3L of NS/LR & Ceftriaxone / Azithromycin   meets SIRS criteria: Heart Rate 83 beats/minute, Respiratory Rate 22 breaths/minute,Temperature 91.4 -F/u cultures, trend lactic/ PCT -Monitor WBC/ fever curve -IV antibiotics: Zosyn  and azithromycin  -IVF hydration as needed -Obtain tracheal aspirate, check strep pneumo and Legionella antigen -Pressors for MAP goal >65 -Strict I/O's  #Acute Metabolic Encephalopathy In the setting of severe metabolic acidosis, severe hypothermia and hypoglycemia #Hx of  seizure-likely EtOH withdrawal seizures -CT head negative -Correct metabolic derangement  -seizure precaution -prn versed  for seizure breakthrough   #AKI #Acute Rhabdomyolysis #Hyponatremia #AGMA with lactic acidosis -Trend Lactate and CK -Monitor I&O's / urinary output -Avoid nephrotoxins -Continue aggressive IV fluid hydration -Follow BMP -Check TSH free T4 -Sodium bicarb gtt -Replace electrolytes as indicated   #Hx of Alcohol Abuse #History of alcohol withdrawal seizures -EtOH level 177 -UDS neg -High risk for alcohol withdrawal -Versed  and fentanyl  gtt for sedation/withdrawal and pain -Versed  1- 2 mg q1h prn anxiety/restlessness/alcohol withdrawal -Thiamine /Folate IV + multivitamin   #Elevated LFTs likely in the setting of Chronic EtoH Abuse -CT abd/pelvis no evidence of cholecystitis -Check hepatitis panel -Check TSH, free T4    Best practice:  Diet:  Tube Feed  Pain/Anxiety/Delirium protocol (if indicated): Yes (RASS goal -  1) VAP protocol (if indicated): Yes DVT prophylaxis: Subcutaneous Heparin  GI prophylaxis: H2B Glucose control:  SSI Yes Central venous access:  Yes, and it is still needed Arterial line:  Yes, and it is still needed Foley:  Yes, and it is still needed Mobility:  bed rest  PT consulted: N/A Last date of multidisciplinary goals of care discussion []  Code Status:  full code Disposition: ICU   = Goals of Care = Code Status Order: FULL  Primary Emergency Contact: Dominica Zelda NOVAK, Home Phone: [phone] No family at bedside with continue with full scope pf care  Critical care time: 45 minutes       Almarie Nose DNP, CCRN, FNP-C, AGACNP-BC Acute Care & Family Nurse Practitioner [facility] PCCM on call pager [phone]

## 2023-04-27 ENCOUNTER — Inpatient Hospital Stay: Payer: 59

## 2023-04-27 DIAGNOSIS — I2489 Other forms of acute ischemic heart disease: Secondary | ICD-10-CM

## 2023-04-27 DIAGNOSIS — M6282 Rhabdomyolysis: Secondary | ICD-10-CM | POA: Diagnosis not present

## 2023-04-27 DIAGNOSIS — N179 Acute kidney failure, unspecified: Secondary | ICD-10-CM | POA: Diagnosis not present

## 2023-04-27 DIAGNOSIS — I214 Non-ST elevation (NSTEMI) myocardial infarction: Secondary | ICD-10-CM | POA: Diagnosis not present

## 2023-04-27 DIAGNOSIS — G9341 Metabolic encephalopathy: Secondary | ICD-10-CM | POA: Diagnosis not present

## 2023-04-27 DIAGNOSIS — T68XXXA Hypothermia, initial encounter: Secondary | ICD-10-CM | POA: Diagnosis not present

## 2023-04-27 LAB — RENAL FUNCTION PANEL
Albumin: 2.6 g/dL — ABNORMAL LOW (ref 3.5–5.0)
Anion gap: 15 (ref 5–15)
BUN: 48 mg/dL — ABNORMAL HIGH (ref 8–23)
CO2: 25 mmol/L (ref 22–32)
Calcium: 5.8 mg/dL — CL (ref 8.9–10.3)
Chloride: 96 mmol/L — ABNORMAL LOW (ref 98–111)
Creatinine, Ser: 3.09 mg/dL — ABNORMAL HIGH (ref 0.61–1.24)
GFR, Estimated: 21 mL/min — ABNORMAL LOW (ref >60)
Glucose, Bld: 111 mg/dL — ABNORMAL HIGH (ref 70–99)
Phosphorus: 6.2 mg/dL — ABNORMAL HIGH (ref 2.5–4.6)
Potassium: 4.9 mmol/L (ref 3.5–5.1)
Sodium: 136 mmol/L (ref 135–145)

## 2023-04-27 LAB — HEPATIC FUNCTION PANEL
ALT: 147 U/L — ABNORMAL HIGH (ref 0–44)
AST: 508 U/L — ABNORMAL HIGH (ref 15–41)
Albumin: 2.5 g/dL — ABNORMAL LOW (ref 3.5–5.0)
Alkaline Phosphatase: 79 U/L (ref 38–126)
Bilirubin, Direct: 0.7 mg/dL — ABNORMAL HIGH (ref 0.0–0.2)
Indirect Bilirubin: 0.5 mg/dL (ref 0.3–0.9)
Total Bilirubin: 1.2 mg/dL (ref 0.0–1.2)
Total Protein: 5.4 g/dL — ABNORMAL LOW (ref 6.5–8.1)

## 2023-04-27 LAB — CBC
HCT: 33.4 % — ABNORMAL LOW (ref 39.0–52.0)
Hemoglobin: 11.7 g/dL — ABNORMAL LOW (ref 13.0–17.0)
MCH: 33.3 pg (ref 26.0–34.0)
MCHC: 35 g/dL (ref 30.0–36.0)
MCV: 95.2 fL (ref 80.0–100.0)
Platelets: 75 10*3/uL — ABNORMAL LOW (ref 150–400)
RBC: 3.51 MIL/uL — ABNORMAL LOW (ref 4.22–5.81)
RDW: 14.3 % (ref 11.5–15.5)
WBC: 20.6 10*3/uL — ABNORMAL HIGH (ref 4.0–10.5)
nRBC: 0.1 % (ref 0.0–0.2)

## 2023-04-27 LAB — HIV ANTIBODY (ROUTINE TESTING W REFLEX): HIV Screen 4th Generation wRfx: NONREACTIVE

## 2023-04-27 LAB — LIPID PANEL
Cholesterol: 127 mg/dL (ref 0–200)
HDL: 71 mg/dL (ref >40)
LDL Cholesterol: 46 mg/dL (ref 0–99)
Total CHOL/HDL Ratio: 1.8 {ratio}
Triglycerides: 52 mg/dL (ref <150)
VLDL: 10 mg/dL (ref 0–40)

## 2023-04-27 LAB — PROCALCITONIN: Procalcitonin: 16.15 ng/mL

## 2023-04-27 LAB — TROPONIN I (HIGH SENSITIVITY): Troponin I (High Sensitivity): 310 ng/L

## 2023-04-27 LAB — GLUCOSE, CAPILLARY
Glucose-Capillary: 106 mg/dL — ABNORMAL HIGH (ref 70–99)
Glucose-Capillary: 115 mg/dL — ABNORMAL HIGH (ref 70–99)
Glucose-Capillary: 167 mg/dL — ABNORMAL HIGH (ref 70–99)
Glucose-Capillary: 110 mg/dL — ABNORMAL HIGH (ref 70–99)
Glucose-Capillary: 144 mg/dL — ABNORMAL HIGH (ref 70–99)

## 2023-04-27 LAB — LACTIC ACID, PLASMA
Lactic Acid, Venous: 1.2 mmol/L (ref 0.5–1.9)
Lactic Acid, Venous: 2 mmol/L (ref 0.5–1.9)
Lactic Acid, Venous: 1.1 mmol/L (ref 0.5–1.9)
Lactic Acid, Venous: 1 mmol/L (ref 0.5–1.9)

## 2023-04-27 LAB — HEPATITIS PANEL, ACUTE
HCV Ab: NONREACTIVE
Hep A IgM: NONREACTIVE
Hep B C IgM: NONREACTIVE
Hepatitis B Surface Ag: NONREACTIVE

## 2023-04-27 LAB — TRIGLYCERIDES: Triglycerides: 54 mg/dL (ref <150)

## 2023-04-27 LAB — HEPARIN LEVEL (UNFRACTIONATED): Heparin Unfractionated: 0.85 [IU]/mL — ABNORMAL HIGH (ref 0.30–0.70)

## 2023-04-27 LAB — MAGNESIUM: Magnesium: 1.7 mg/dL (ref 1.7–2.4)

## 2023-04-27 LAB — HEMOGLOBIN A1C
Hgb A1c MFr Bld: 5.1 % (ref 4.8–5.6)
Mean Plasma Glucose: 99.67 mg/dL

## 2023-04-27 LAB — CK: Total CK: 6705 U/L — ABNORMAL HIGH (ref 49–397)

## 2023-04-27 MED ORDER — CALCIUM GLUCONATE-NACL 2-0.675 GM/100ML-% IV SOLN
2.0000 g | Freq: Once | INTRAVENOUS | Status: AC
  Administered 2023-04-27: 2000 mg via INTRAVENOUS
  Filled 2023-04-27: qty 100

## 2023-04-27 MED ORDER — CALCIUM GLUCONATE-NACL 2-0.675 GM/100ML-% IV SOLN
2.0000 g | INTRAVENOUS | Status: AC
  Administered 2023-04-27 (×2): 2000 mg via INTRAVENOUS
  Filled 2023-04-27 (×2): qty 100

## 2023-04-27 MED ORDER — HYDROCORTISONE SOD SUC (PF) 100 MG IJ SOLR
50.0000 mg | Freq: Two times a day (BID) | INTRAMUSCULAR | Status: DC
  Administered 2023-04-27 – 2023-04-29 (×4): 50 mg via INTRAVENOUS
  Filled 2023-04-27 (×4): qty 2

## 2023-04-27 MED ORDER — MAGNESIUM SULFATE 2 GM/50ML IV SOLN
2.0000 g | Freq: Once | INTRAVENOUS | Status: AC
  Administered 2023-04-27: 2 g via INTRAVENOUS
  Filled 2023-04-27: qty 50

## 2023-04-27 MED ORDER — NOREPINEPHRINE 4 MG/250ML-% IV SOLN
0.0000 ug/min | INTRAVENOUS | Status: DC
  Administered 2023-04-27 – 2023-04-29 (×2): 2 ug/min via INTRAVENOUS
  Filled 2023-04-27: qty 250

## 2023-04-27 MED ORDER — PIPERACILLIN-TAZOBACTAM 3.375 G IVPB
3.3750 g | Freq: Three times a day (TID) | INTRAVENOUS | Status: DC
  Administered 2023-04-27 – 2023-05-01 (×12): 3.375 g via INTRAVENOUS
  Filled 2023-04-27 (×12): qty 50

## 2023-04-27 MED ORDER — SODIUM CHLORIDE 0.9 % IV SOLN: INTRAVENOUS | Status: DC

## 2023-04-27 NOTE — Consult Note (Signed)
 Subjective:   CC: Recurrent right inguinal hernia  HPI:  Curtis [name] is a 68 y.o. male who was referred by [facility] for evaluation of above cc.   Currently in the hospital intubated and sedated.  Unsure history on how long he has had this right inguinal hernia noted on CT scan during admission.  Last repair was 2018.    Past Medical History:  has a past medical history of EtOH dependence (HCC), History of echocardiogram, and Incarcerated inguinal hernia.  Past Surgical History:  Past Surgical History:  Procedure Laterality Date   INGUINAL HERNIA REPAIR Right 11/01/2016   Procedure: HERNIA REPAIR INGUINAL INCARCERATED;  Surgeon: Jordis Laneta FALCON, MD;  Location: [facility] ORS;  Service: General;  Laterality: Right;   NO PAST SURGERIES      Family History: Family history is unknown by patient.  Social History:  reports that he has been smoking cigarettes. His smokeless tobacco use includes chew. He reports current alcohol use of about 126.0 standard drinks of alcohol per week. He reports that he does not use drugs.  Current Medications:  Prior to Admission medications   Not on File    Allergies:  Allergies as of 04/25/2023   (No Known Allergies)    ROS:  Unable to obtain secondary to patient's status    Objective:     BP (!) 140/62   Pulse 80   Temp 98.1 F (36.7 C)   Resp 20   Ht 5' 7 (1.702 m)   Wt 76.1 kg   SpO2 98%   BMI 26.28 kg/m   Constitutional :  Intubated and sedated  Lymphatics/Throat:  no asymmetry, masses, or scars  Respiratory:  clear to auscultation bilaterally  Cardiovascular:  regular rate and rhythm  Gastrointestinal: soft, non-tender; bowel sounds normal; no masses,  no organomegaly and large right inguinal hernia that is partially reducible with self palpation.  Hernia does recur as soon as pressure is released over it. .   Musculoskeletal: lying in bed, no obvious difficulty moving upper extremities  Skin: Cool and moist  Psychiatric: Normal affect,  non-agitated, not confused       LABS:     Latest Ref Rng & Units 04/27/2023    4:24 AM 04/26/2023    5:05 PM 04/26/2023   10:52 AM  CMP  Glucose 70 - 99 mg/dL 888  791  809   BUN 8 - 23 mg/dL 48  43  40   Creatinine 0.61 - 1.24 mg/dL 6.90  7.35  7.62   Sodium 135 - 145 mmol/L 136  135  136   Potassium 3.5 - 5.1 mmol/L 4.9  4.8  5.7   Chloride 98 - 111 mmol/L 96  94  96   CO2 22 - 32 mmol/L 25  26  21    Calcium  8.9 - 10.3 mg/dL 5.8  6.1  6.2   Total Protein 6.5 - 8.1 g/dL 5.4     Total Bilirubin 0.0 - 1.2 mg/dL 1.2     Alkaline Phos 38 - 126 U/L 79     AST 15 - 41 U/L 508     ALT 0 - 44 U/L 147         Latest Ref Rng & Units 04/27/2023    4:27 AM 04/26/2023    3:25 AM 04/25/2023    6:19 PM  CBC  WBC 4.0 - 10.5 K/uL 20.6  6.4  15.7   Hemoglobin 13.0 - 17.0 g/dL 88.2  87.6  13.3  Hematocrit 39.0 - 52.0 % 33.4  35.8  40.1   Platelets 150 - 400 K/uL 75  193  205     RADS: CLINICAL DATA:  Sepsis unresponsive   EXAM: CT CHEST, ABDOMEN AND PELVIS WITHOUT CONTRAST   TECHNIQUE: Multidetector CT imaging of the chest, abdomen and pelvis was performed following the standard protocol without IV contrast.   RADIATION DOSE REDUCTION: This exam was performed according to the departmental dose-optimization program which includes automated exposure control, adjustment of the mA and/or kV according to patient size and/or use of iterative reconstruction technique.   COMPARISON:  Chest x-ray 04/25/2023   FINDINGS: CT CHEST FINDINGS   Cardiovascular: Limited assessment without intravenous contrast. Moderate aortic atherosclerosis. No aneurysm. Normal cardiac size. No pericardial effusion.   Mediastinum/Nodes: Endotracheal tube tip is just above the carina. Enteric tube tip in the gastric fundus. No suspicious lymph nodes.   Lungs/Pleura: Emphysema. Multifocal bilateral ground-glass densities consistent with respiratory infection/pneumonia. 4 mm right upper lobe pulmonary nodule  on series 4, image 76. There are other small pulmonary nodules noted. Diffuse bilateral bronchial wall thickening. Mild mucous plugging in the right lower lobe.   Musculoskeletal: No acute or suspicious osseous abnormality   CT ABDOMEN PELVIS FINDINGS   Hepatobiliary: Hepatic steatosis. Diffuse increased density in the gallbladder without calcified stone. No biliary dilatation   Pancreas: Unremarkable. No pancreatic ductal dilatation or surrounding inflammatory changes.   Spleen: Normal in size without focal abnormality.   Adrenals/Urinary Tract: Adrenal glands are within normal limits. Kidneys show no hydronephrosis. Bladder is decompressed by Foley catheter.   Stomach/Bowel: Moderate air distension of the stomach. Mild air and fluid-filled small bowel without obstruction or acute bowel wall thickening. Negative appendix.   Vascular/Lymphatic: Advanced aortic atherosclerosis. No aneurysm. No suspicious lymph nodes   Reproductive: Negative prostate   Other: Negative for pelvic effusion or free air. Large right inguinal hernia containing mesenteric fat and small bowel but no obstruction or incarceration.   Musculoskeletal: Degenerative changes at L5-S1. No acute osseous abnormality.   IMPRESSION: 1. Diffuse bilateral bronchial wall thickening with mild mucous plugging in the right lower lobe. Scattered bilateral ground-glass opacities consistent with respiratory infection/pneumonia, to include atypical organisms. 2. Emphysema. 3. Hepatic steatosis. 4. Large right inguinal hernia containing mesenteric fat and small bowel but no obstruction or incarceration. 5. Aortic atherosclerosis. 6. 4 mm right upper lobe pulmonary nodule. No follow-up needed if patient is low-risk (and has no known or suspected primary neoplasm). Non-contrast chest CT can be considered in 12 months if patient is high-risk. This recommendation follows the consensus statement: Guidelines for  Management of Incidental Pulmonary Nodules Detected on CT Images: From the Fleischner Society 2017; Radiology 2017; 284:228-243.   Aortic Atherosclerosis (ICD10-I70.0) and Emphysema (ICD10-J43.9).     Electronically Signed   By: Luke Bun M.D.   On: 04/25/2023 21:39 Assessment:   Recurrent right inguinal hernia was noted on CT as well. Plan:   Hernia fortunately is partially reducible at bedside.  Due to his multiple acute medical condition, patient is in no shape to proceed with any sort of repair.  Due to the reducible nature, doubt the large hernia is contributing to any of his active issues.  Can consider outpatient discussion of repair in the future if patient desires at that time.  Surgery to sign off.  Please call with any further questions or concerns.  Further care per ICU team. labs/images/medications/previous chart entries reviewed personally and relevant changes/updates noted above.

## 2023-04-27 NOTE — Progress Notes (Addendum)
 Cardiology Progress Note   Patient Name: Curtis [name] Date of Encounter: 04/27/2023  Primary Cardiologist: Lonni Hanson, MD  Subjective   Intubated, sedated. Objective   Inpatient Medications    Scheduled Meds:  aspirin   81 mg Per Tube Daily   Chlorhexidine  Gluconate Cloth  6 each Topical Nightly   folic acid   1 mg Per Tube Daily   free water   30 mL Per Tube Q4H   hydrocortisone  sod succinate (SOLU-CORTEF ) inj  50 mg Intravenous Q12H   insulin  aspart  0-15 Units Subcutaneous Q4H   multivitamin with minerals  1 tablet Per Tube Daily   mouth rinse  15 mL Mouth Rinse Q2H   pantoprazole  (PROTONIX ) IV  40 mg Intravenous QHS   [START ON 04/29/2023] thiamine  (VITAMIN B1) injection  100 mg Intravenous Q24H   Continuous Infusions:  sodium chloride  150 mL/hr at 04/27/23 0736   calcium  gluconate     doxycycline  (VIBRAMYCIN ) IV     feeding supplement (VITAL AF 1.2 CAL) 20 mL/hr at 04/27/23 0701   fentaNYL  infusion INTRAVENOUS Stopped (04/27/23 9191)   norepinephrine  (LEVOPHED ) Adult infusion Stopped (04/26/23 1305)   piperacillin -tazobactam (ZOSYN )  IV     propofol  (DIPRIVAN ) infusion 20 mcg/kg/min (04/27/23 0808)   thiamine  (VITAMIN B1) injection Stopped (04/26/23 1154)   vasopressin  Stopped (04/26/23 1348)   PRN Meds: docusate, midazolam , mouth rinse, polyethylene glycol   Vital Signs    Vitals:   04/27/23 0500 04/27/23 0600 04/27/23 0737 04/27/23 0800  BP: (!) 124/52 (!) 125/52  (!) 119/53  [name]: 83 85  83  Resp: (!) 22 (!) 22  (!) 22  Temp: 97.9 F (36.6 C) 98.1 F (36.7 C)  98.1 F (36.7 C)  TempSrc:      SpO2: 96% 97% 96% 96%  Weight:      Height:        Intake/Output Summary (Last 24 hours) at 04/27/2023 0812 Last data filed at 04/27/2023 0701 Gross per 24 hour  Intake 5581.7 ml  Output 730 ml  Net 4851.7 ml   Filed Weights   04/25/23 2200 04/26/23 0416 04/27/23 0345  Weight: 71.7 kg 71.7 kg 76.1 kg    Physical Exam   GEN: somewhat frail.   Intubated, sedated. HEENT: Grossly normal.  Neck: Supple, no JVD, carotid bruits, or masses. Cardiac: RRR, no murmurs, rubs, or gallops. No clubbing, cyanosis, edema.  Radials 2+, DP/PT 2+ and equal bilaterally.  Respiratory:  Respirations regular and unlabored, coarse breath sounds bilat. GI: Distended, BS + x 4. MS: no deformity or atrophy. Skin: warm and dry, no rash. Neuro:  sedated. Psych: sedated, opens eyes occasionally.  Labs    Chemistry Recent Labs  Lab 04/25/23 1819 04/26/23 0325 04/26/23 1052 04/26/23 1705 04/27/23 0424  NA 131*   < > 136 135 136  K 4.8   < > 5.7* 4.8 4.9  CL 92*   < > 96* 94* 96*  CO2 16*   < > 21* 26 25  GLUCOSE 286*   < > 190* 208* 111*  BUN 31*   < > 40* 43* 48*  CREATININE 1.91*   < > 2.37* 2.64* 3.09*  CALCIUM  8.3*   < > 6.2* 6.1* 5.8*  PROT 6.5  --   --   --  5.4*  ALBUMIN  3.5  --   --   --  2.5*  2.6*  AST 284*  --   --   --  508*  ALT 83*  --   --   --  147*  ALKPHOS 125  --   --   --  79  BILITOT 1.1  --   --   --  1.2  GFRNONAA 37*   < > 29* 25* 21*  ANIONGAP 23*   < > 19* 15 15   < > = values in this interval not displayed.     Hematology Recent Labs  Lab 04/25/23 1819 04/26/23 0325 04/27/23 0427  WBC 15.7* 6.4 20.6*  RBC 4.05* 3.67* 3.51*  HGB 13.3 12.3* 11.7*  HCT 40.1 35.8* 33.4*  MCV 99.0 97.5 95.2  MCH 32.8 33.5 33.3  MCHC 33.2 34.4 35.0  RDW 13.3 13.2 14.3  PLT 205 193 75*    Cardiac Enzymes  Recent Labs  Lab 04/25/23 1819 04/26/23 0753 04/26/23 1052  TROPONINIHS 32* 573* 665*      BNP    Component Value Date/Time   BNP 52 08/23/2013 1819    Lipids  Lab Results  Component Value Date   CHOL 127 04/27/2023   HDL 71 04/27/2023   LDLCALC 46 04/27/2023   TRIG 54 04/27/2023   TRIG 52 04/27/2023   CHOLHDL 1.8 04/27/2023    HbA1c  Lab Results  Component Value Date   HGBA1C 5.1 04/26/2023    Radiology    US  SCROTUM W/DOPPLER Result Date: 04/26/2023 CLINICAL DATA:  Right testicular  swelling. EXAM: SCROTAL ULTRASOUND DOPPLER ULTRASOUND OF THE TESTICLES TECHNIQUE: Complete ultrasound examination of the testicles, epididymis, and other scrotal structures was performed. Color and spectral Doppler ultrasound were also utilized to evaluate blood flow to the testicles. COMPARISON:  CT abdomen pelvis from yesterday. Scrotal ultrasound dated June 22, 2016. FINDINGS: Right testicle Measurements: 4.3 x 2.1 x 3.0 cm. No mass or microlithiasis visualized. Left testicle Measurements: 3.9 x 1.7 x 2.5 cm. No mass or microlithiasis visualized. Right epididymis:  Normal in size and appearance. Left epididymis:  Hypervascular. Hydrocele:  Small right hydrocele. Varicocele:  None visualized. Pulsed Doppler interrogation of both testes demonstrates normal low resistance arterial and venous waveforms bilaterally. Large right inguinal hernia containing bowel and fat. IMPRESSION: 1. Large right inguinal hernia containing bowel and fat. 2. Small right hydrocele. 3. Left epididymitis. Electronically Signed   By: Elsie ONEIDA Shoulder M.D.   On: 04/26/2023 18:46   DG Abd 1 View Result Date: 04/26/2023 CLINICAL DATA:  NG placement. EXAM: ABDOMEN - 1 VIEW COMPARISON:  Abdominal radiograph dated 11/01/2016. FINDINGS: Enteric tube with tip in the left upper abdomen in the region of the gastric fundus. Persistent dilated small bowel loops measure up to 5.5 cm in caliber. IMPRESSION: Enteric tube with tip in the gastric fundus. Electronically Signed   By: Vanetta Chou M.D.   On: 04/26/2023 15:22   DG Chest Port 1 View Result Date: 04/26/2023 CLINICAL DATA:  Central line placement. EXAM: PORTABLE CHEST 1 VIEW COMPARISON:  04/25/2023 FINDINGS: Endotracheal tube tip is approximately 3.9 cm above the base of the carina. NG tube tip is in the stomach. Left IJ central line tip overlies the mid to distal SVC level. The lungs are clear without focal pneumonia, edema, pneumothorax or pleural effusion. Interstitial markings are  diffusely coarsened with chronic features. The cardiopericardial silhouette is within normal limits for size. Telemetry leads overlie the chest. IMPRESSION: 1. Left IJ central line tip overlies the mid to distal SVC level. No pneumothorax. 2. Endotracheal tube tip is approximately 3.9 cm above the base of the carina. Electronically Signed   By: Camellia Candle M.D.   On: 04/26/2023  05:55   CT CHEST ABDOMEN PELVIS WO CONTRAST Result Date: 04/25/2023 CLINICAL DATA:  Sepsis unresponsive EXAM: CT CHEST, ABDOMEN AND PELVIS WITHOUT CONTRAST TECHNIQUE: Multidetector CT imaging of the chest, abdomen and pelvis was performed following the standard protocol without IV contrast. RADIATION DOSE REDUCTION: This exam was performed according to the departmental dose-optimization program which includes automated exposure control, adjustment of the mA and/or kV according to patient size and/or use of iterative reconstruction technique. COMPARISON:  Chest x-ray 04/25/2023 FINDINGS: CT CHEST FINDINGS Cardiovascular: Limited assessment without intravenous contrast. Moderate aortic atherosclerosis. No aneurysm. Normal cardiac size. No pericardial effusion. Mediastinum/Nodes: Endotracheal tube tip is just above the carina. Enteric tube tip in the gastric fundus. No suspicious lymph nodes. Lungs/Pleura: Emphysema. Multifocal bilateral ground-glass densities consistent with respiratory infection/pneumonia. 4 mm right upper lobe pulmonary nodule on series 4, image 76. There are other small pulmonary nodules noted. Diffuse bilateral bronchial wall thickening. Mild mucous plugging in the right lower lobe. Musculoskeletal: No acute or suspicious osseous abnormality CT ABDOMEN PELVIS FINDINGS Hepatobiliary: Hepatic steatosis. Diffuse increased density in the gallbladder without calcified stone. No biliary dilatation Pancreas: Unremarkable. No pancreatic ductal dilatation or surrounding inflammatory changes. Spleen: Normal in size without focal  abnormality. Adrenals/Urinary Tract: Adrenal glands are within normal limits. Kidneys show no hydronephrosis. Bladder is decompressed by Foley catheter. Stomach/Bowel: Moderate air distension of the stomach. Mild air and fluid-filled small bowel without obstruction or acute bowel wall thickening. Negative appendix. Vascular/Lymphatic: Advanced aortic atherosclerosis. No aneurysm. No suspicious lymph nodes Reproductive: Negative prostate Other: Negative for pelvic effusion or free air. Large right inguinal hernia containing mesenteric fat and small bowel but no obstruction or incarceration. Musculoskeletal: Degenerative changes at L5-S1. No acute osseous abnormality. IMPRESSION: 1. Diffuse bilateral bronchial wall thickening with mild mucous plugging in the right lower lobe. Scattered bilateral ground-glass opacities consistent with respiratory infection/pneumonia, to include atypical organisms. 2. Emphysema. 3. Hepatic steatosis. 4. Large right inguinal hernia containing mesenteric fat and small bowel but no obstruction or incarceration. 5. Aortic atherosclerosis. 6. 4 mm right upper lobe pulmonary nodule. No follow-up needed if patient is low-risk (and has no known or suspected primary neoplasm). Non-contrast chest CT can be considered in 12 months if patient is high-risk. This recommendation follows the consensus statement: Guidelines for Management of Incidental Pulmonary Nodules Detected on CT Images: From the Fleischner Society 2017; Radiology 2017; 284:228-243. Aortic Atherosclerosis (ICD10-I70.0) and Emphysema (ICD10-J43.9). Electronically Signed   By: Luke Bun M.D.   On: 04/25/2023 21:39   CT Cervical Spine Wo Contrast Result Date: 04/25/2023 CLINICAL DATA:  Found unresponsive EXAM: CT CERVICAL SPINE WITHOUT CONTRAST TECHNIQUE: Multidetector CT imaging of the cervical spine was performed without intravenous contrast. Multiplanar CT image reconstructions were also generated. RADIATION DOSE REDUCTION:  This exam was performed according to the departmental dose-optimization program which includes automated exposure control, adjustment of the mA and/or kV according to patient size and/or use of iterative reconstruction technique. COMPARISON:  None Available. FINDINGS: Alignment: Within normal limits. Skull base and vertebrae: 7 cervical segments are well visualized. Extensive disc space narrowing and osteophytic changes are seen. No acute fracture or acute facet abnormality is noted. Degenerative facet hypertrophic changes are noted as well. Soft tissues and spinal canal: Surrounding soft tissue structures are within normal limits. Endotracheal tube and gastric catheter are seen in satisfactory position. Upper chest: Visualized lung apices are unremarkable. Other: None IMPRESSION: Extensive osteophytic and facet hypertrophic changes are noted without acute abnormality. Electronically Signed   By: Oneil  Lukens M.D.   On: 04/25/2023 21:26   CT Head Wo Contrast Result Date: 04/25/2023 CLINICAL DATA:  Mental status change, persistent or worsening. Found unresponsive EXAM: CT HEAD WITHOUT CONTRAST TECHNIQUE: Contiguous axial images were obtained from the base of the skull through the vertex without intravenous contrast. RADIATION DOSE REDUCTION: This exam was performed according to the departmental dose-optimization program which includes automated exposure control, adjustment of the mA and/or kV according to patient size and/or use of iterative reconstruction technique. COMPARISON:  05/14/2017 FINDINGS: Brain: Mild age related volume loss. Mild chronic small vessel disease throughout the deep white matter. No acute intracranial abnormality. Specifically, no hemorrhage, hydrocephalus, mass lesion, acute infarction, or significant intracranial injury. Vascular: No hyperdense vessel or unexpected calcification. Skull: No acute calvarial abnormality. Sinuses/Orbits: Mucosal thickening throughout the paranasal sinuses.  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 This is unchanged since prior study. Other: None IMPRESSION: Atrophy, chronic microvascular disease. No acute intracranial abnormality. Chronic sinusitis. Electronically Signed   By: Franky Crease M.D.   On: 04/25/2023 21:24   DG Chest Port 1 View Result Date: 04/25/2023 CLINICAL DATA:  Questionable sepsis. EXAM: PORTABLE CHEST 1 VIEW COMPARISON:  Chest x-ray 06/21/2016 FINDINGS: Endotracheal tube tip is 2.6 cm above the carina. The cardiomediastinal silhouette is within normal limits. The lungs are clear. There is no pleural effusion or pneumothorax. No acute fractures are seen. IMPRESSION: 1. Endotracheal tube tip is 2.6 cm above the carina. 2. No acute cardiopulmonary process. Electronically Signed   By: Greig Pique M.D.   On: 04/25/2023 18:53     Telemetry    rsr - Personally Reviewed  Cardiac Studies   2D Echocardiogram 1.10.2025   1. Left ventricular ejection fraction, by estimation, is >55%. The left  ventricle has normal function. Left ventricular endocardial border not  optimally defined to evaluate regional wall motion. Left ventricular  diastolic function could not be  evaluated.   2. Right ventricular systolic function is mildly reduced. The right  ventricular size is normal. Mildly increased right ventricular wall  thickness.   3. The mitral valve is degenerative. Trivial mitral valve regurgitation.  Unable to assess for mitral stenosis.   4. The aortic valve was not well visualized. Aortic valve regurgitation  is not visualized. Aortic valve gradient not assessed.    Patient Profile     68 y.o. male with a history of EtOH abuse, who presented 1/9 w/ unresponsiveness, hypothermia (6F), EtOH intoxication, lactic acidosis, resp failure req intubation, rhabdomyolysis, and troponin elevation.  Echo 1/10 w/ nl EF.  Assessment & Plan    1.  Demand Ischemia:  Pt admitted w/ unresponsiveness in the setting of profound  hypothermia, hypoglycemia, hypotension, and alcohol intoxication (EtOH 177).  Presenting ECG w/ IVCD, 1st deg AVB, and prolonged QT.  Trops elevated @ 32  573  665.  CK 5249.  Echo performed 1/10 showed nl LV/RV fxn w/o significant valvular dzs.  Troponin elevation most consistent with demand ischemia and rhabdo likely playing a role.  Heparin  d/c'd due to thrombocytopenia w/ drop in plts to 75k.  Will hold ASA as well.  Pressures stable.  Consider ? blocker though pressures intermittently soft yesterday.  No statin in setting of LFT abnormalities.  LDL only 46.  Pending recovery, can consider outpt ischemic evaluation for risk stratification.   2.  Hypothermia:  Resolved.   3.  Hypoglycemia/hyperglycemia:  in setting of unresponsiveness/alcoholism/hypothermia.  Gluc 111 this AM.  A1c 5.1.   4.  Acute renal  failure:  BUN/Creat worsening - 48/3.09 this AM.  IVF per CCM.  Avoiding nephrotoxic agents.  5.  Hyperkalemia:  K 4.9 this AM.  6.  Rhabdomyolysis:  In setting of unclear duration of down time.  CK up to 6730 1/10, 6705 this AM.  7.  Hypocalcemia:  Ca 5.8.  albumin  2.5.  Receiving calcium  gluconate per CCM.  8.  Transaminitis:  In setting of above.  Follow.  Avoiding hepatotoxic agents.   Signed, Lonni Meager, NP  04/27/2023, 8:12 AM    For questions or updates, please contact   Please consult [email or web address] for contact info under Cardiology/STEMI.

## 2023-04-27 NOTE — Progress Notes (Signed)
 0730 patient on vent not following commands  0740 decreased sedation for possible wean 1000 patient following some commands at times  0808 fentanyl  off 1106 patient placed on wean prop off 1146 patient flipped back to full support patient hypertensive fighting vent coughing not redirectable.Prop restarted 1200 PRN versed  given  1207 levo started 1305 levo off 1500 fentanyl  restarted 1550 levo restarted

## 2023-04-27 NOTE — Plan of Care (Signed)
  Problem: Education: Goal: Knowledge of General Education information will improve Description: Including pain rating scale, medication(s)/side effects and non-pharmacologic comfort measures Outcome: Not Progressing   Problem: Health Behavior/Discharge Planning: Goal: Ability to manage health-related needs will improve Outcome: Not Progressing   Problem: Clinical Measurements: Goal: Ability to maintain clinical measurements within normal limits will improve Outcome: Not Progressing Goal: Will remain free from infection Outcome: Not Progressing Goal: Diagnostic test results will improve Outcome: Not Progressing Goal: Respiratory complications will improve Outcome: Not Progressing Goal: Cardiovascular complication will be avoided Outcome: Not Progressing   Problem: Activity: Goal: Risk for activity intolerance will decrease Outcome: Not Progressing   Problem: Nutrition: Goal: Adequate nutrition will be maintained Outcome: Not Progressing   Problem: Coping: Goal: Level of anxiety will decrease Outcome: Not Progressing   Problem: Elimination: Goal: Will not experience complications related to bowel motility Outcome: Not Progressing Goal: Will not experience complications related to urinary retention Outcome: Not Progressing   Problem: Pain Management: Goal: General experience of comfort will improve Outcome: Not Progressing   Problem: Safety: Goal: Ability to remain free from injury will improve Outcome: Not Progressing   Problem: Skin Integrity: Goal: Risk for impaired skin integrity will decrease Outcome: Not Progressing   Problem: Education: Goal: Ability to describe self-care measures that may prevent or decrease complications (Diabetes Survival Skills Education) will improve Outcome: Not Progressing Goal: Individualized Educational Video(s) Outcome: Not Progressing   Problem: Coping: Goal: Ability to adjust to condition or change in health will  improve Outcome: Not Progressing   Problem: Fluid Volume: Goal: Ability to maintain a balanced intake and output will improve Outcome: Not Progressing   Problem: Health Behavior/Discharge Planning: Goal: Ability to identify and utilize available resources and services will improve Outcome: Not Progressing Goal: Ability to manage health-related needs will improve Outcome: Not Progressing   Problem: Metabolic: Goal: Ability to maintain appropriate glucose levels will improve Outcome: Not Progressing   Problem: Nutritional: Goal: Maintenance of adequate nutrition will improve Outcome: Not Progressing Goal: Progress toward achieving an optimal weight will improve Outcome: Not Progressing   Problem: Skin Integrity: Goal: Risk for impaired skin integrity will decrease Outcome: Not Progressing   Problem: Tissue Perfusion: Goal: Adequacy of tissue perfusion will improve Outcome: Not Progressing

## 2023-04-27 NOTE — Progress Notes (Addendum)
 NAME:  Curtis [name], MRN:  [id number], DOB:  April 03, 1956, LOS: 2 ADMISSION DATE:  04/25/2023,  CHIEF COMPLAINT: Severe Hypothermia  History of Present Illness:   68 y.o male with significant PMH of incarcerated inguinal hernia s/p repair, SBO, alcohol withdrawal seizure, EtOH abuse who presented to the ED with chief complaints of unresponsiveness.   Per ED report, EMS was dispatched to patient's residence by a neighbor who found the patient unconscious.  On EMS arrival, patient was found unresponsive on floor of bedroom. Neighbor report LKNW was 3 days ago. Patient was initially combative and would tense resist any treatment. EMS report they were unable to obtain a temperature or spo2 due to hypothermia and unable to obtain bp due to combative resistance. patient initial cbg registered at 55 treated with 1mg  glucagon repeat reading were in the upper 20's   ED Course: Initial vital signs showed HR of 55 beats/minute, BP 99/47 mm Hg, the RR 22 breaths/minute, and the oxygen saturation 100% on NRB and a temperature of 69F (25C). Patient intubated in for airway protection. Pertinent Labs/Diagnostics Findings: Na+/ K+:131/4.8  Glucose: 286 BUN/Cr.: 31/1.91 CO2 1, Anion Gap 23, AST/ALT:284/83 WBC: 15.7K/L with neutrophil predominance  Lactic acid: >9.0 COVID PCR: Negative,  troponin: 32  CK 5249 ETOH:177 VBG: pO2 46.2; pCO2 72; pH 6.96;  HCO3 16.2, %O2 Sat 46.6.  CXR> CTH> CTA Chest> CT Abd/pelvis>see results below Medication administered in the ZI:Ejupzwu given 30 cc/kg of fluids and started on broad-spectrum antibiotics with Ceftriaxone  for suspected sepsis with septic shock.   Pertinent  Medical History  -incarcerated inguinal hernia s/p repair -EtOH abuse with withdrawal seizures  Significant Hospital Events: Including procedures, antibiotic start and stop dates in addition to other pertinent events   1/9: intubated, admit to ICU 1/10: rewarmed, remained intubated 1/11: opens eyes to  verbal stimuli but does not follow commands  Interim History / Subjective:  Remains sedated, re-warmed, minimal vent settings  Objective   Blood pressure (!) 111/53, pulse 85, temperature 98.1 F (36.7 C), temperature source Bladder, resp. rate (!) 21, height 5' 7 (1.702 m), weight 76.1 kg, SpO2 96%.    Vent Mode: PRVC FiO2 (%):  [40 %] 40 % Set Rate:  [20 bmp-22 bmp] 20 bmp Vt Set:  [500 mL] 500 mL PEEP:  [5 cmH20-8 cmH20] 5 cmH20 Plateau Pressure:  [18 cmH20-20 cmH20] 20 cmH20   Intake/Output Summary (Last 24 hours) at 04/27/2023 1000 Last data filed at 04/27/2023 0820 Gross per 24 hour  Intake 5764.93 ml  Output 650 ml  Net 5114.93 ml   Filed Weights   04/25/23 2200 04/26/23 0416 04/27/23 0345  Weight: 71.7 kg 71.7 kg 76.1 kg    Examination: Physical Exam Constitutional:      General: He is not in acute distress.    Appearance: He is ill-appearing.  HENT:     Mouth/Throat:     Comments: ETT in place Cardiovascular:     Rate and Rhythm: Normal rate and regular rhythm.  Pulmonary:     Comments: Ventilated breath sounds bilaterally Abdominal:     Palpations: Abdomen is soft.  Musculoskeletal:     Right lower leg: No edema.     Left lower leg: No edema.  Neurological:     Mental Status: He is disoriented.     Deep Tendon Reflexes: Reflexes normal.       Assessment & Plan:   Neurology #Toxic Metabolic Encephalopathy #EtOH Intoxication and Abuse #History of EtOH withdrawal seizure  Patient  with GCS of 3 on presentation to the ED with encephalopathy requiring intubation. Did have an alcohol level of 177 and was likely intoxicated and subsequently developed severe hypothermia from exposure to extreme cold. History of withdrawal seizures in the past noted.  -high dose thiamine  and folate -propofol  gtt and midazolam  PRN for sedation and prevention of EtOH withdrawal -Fentanyl  for analgesia per CPOT -goal RASS -1 -Daily wake up assessment -monitor for EtOH  withdrawal  Cardiovascular #Circulatory Shock #NSTEMI #Severe Hypothermia  Not known to have CAD but is at risk. Circulatory shock secondary to severe hypothermia with sedation related hypotension contributing as well. Hypoperfusion noted with significant elevation in lactic acid, now improving. Troponin checked and is elevated. Heparin  initiated and cardiology consultation obtained. Overall suspect troponin leak from skeletal muscle over cardiac muscle, and with drop in platelets will stop anti-coagulation for now. Vasopressor requirements are sedation related and he was weaned off this AM  -goal MAP > 65 mmHg  Pulmonary #Acute Hypoxic Respiratory Failure  In the setting of severe hypothermia and encephalopathy requiring intubation and mechanical ventilation. On minimal ventilator settings at the moment with the improvement in his metabolic acidosis. He did pass SBT but mental status remains barrier to extubation.  -Full vent support, implement lung protective strategies -Plateau pressures less than 30 cm H20 -Wean FiO2 & PEEP as tolerated to maintain O2 sats >92% -Follow intermittent Chest X-ray & ABG as needed -Spontaneous Breathing Trials when respiratory parameters met and mental status permits -Implement VAP Bundle -Prn Bronchodilators  Gastrointestinal #Elevated transaminases  SUP in place, on tube feeds. Avoiding hepatotoxins, monitor LFT's daily  Renal #AKI #Metabolic Acidosis #Rhabdomyolysis #HyperKalemia #HypoCalcemia #Severe Hypothermia  Found down for an unknown length of time with severe hypothermia. Does have significant elevation in CK consistent with rhabdomyolysis. Has received volume resuscitation and switched from bicarb to LR. No indication for dialysis at the moment, will continue to monitor urine output and electrolytes.  -treated hyperK with K binders, bicarbonate, and insulin  -Calcium  gluconate repletion -continue IV fluids -frequent blood gas  checks  Endocrine #Severe Hypothermia  In the setting of alcohol intoxication and exposure. Thyroid function notes sick euthyroid.  -wean stress dose steroids  Hem/Onc  Heparin  gtt for management of ACS  ID  Found down with high potential and risk for aspiration. Will cover broadly for sepsis given severe hypothermia.  -continue zosyn   Best Practice (right click and Reselect all SmartList Selections daily)   Diet/type: tubefeeds DVT prophylaxis systemic heparin  Pressure ulcer(s): N/A GI prophylaxis: PPI Lines: Central line Foley:  Yes, and it is still needed Code Status:  full code Last date of multidisciplinary goals of care discussion [04/27/2023]  Labs   CBC: Recent Labs  Lab 04/25/23 1819 04/26/23 0325 04/27/23 0427  WBC 15.7* 6.4 20.6*  NEUTROABS 13.5*  --   --   HGB 13.3 12.3* 11.7*  HCT 40.1 35.8* 33.4*  MCV 99.0 97.5 95.2  PLT 205 193 75*    Basic Metabolic Panel: Recent Labs  Lab 04/25/23 1819 04/26/23 0325 04/26/23 1052 04/26/23 1705 04/27/23 0424 04/27/23 0427  NA 131* 140 136 135 136  --   K 4.8 4.6 5.7* 4.8 4.9  --   CL 92* 100 96* 94* 96*  --   CO2 16* 16* 21* 26 25  --   GLUCOSE 286* 94 190* 208* 111*  --   BUN 31* 32* 40* 43* 48*  --   CREATININE 1.91* 1.79* 2.37* 2.64* 3.09*  --  CALCIUM  8.3* 7.1* 6.2* 6.1* 5.8*  --   MG  --  2.0  --   --   --  1.7  PHOS  --  7.7*  --   --  6.2*  --    GFR: Estimated Creatinine Clearance: 21.1 mL/min (A) (by C-G formula based on SCr of 3.09 mg/dL (H)). Recent Labs  Lab 04/25/23 1819 04/25/23 1820 04/26/23 0325 04/26/23 0812 04/26/23 1406 04/26/23 1705 04/26/23 2228 04/27/23 0424 04/27/23 0427  PROCALCITON  --   --   --   --   --   --   --  16.15  --   WBC 15.7*  --  6.4  --   --   --   --   --  20.6*  LATICACIDVEN  --    < > 6.7*   < > 2.4* 2.5* 1.9  --  1.2   < > = values in this interval not displayed.    Liver Function Tests: Recent Labs  Lab 04/25/23 1819 04/27/23 0424   AST 284* 508*  ALT 83* 147*  ALKPHOS 125 79  BILITOT 1.1 1.2  PROT 6.5 5.4*  ALBUMIN  3.5 2.5*  2.6*   No results for input(s): LIPASE, AMYLASE in the last 168 hours. No results for input(s): AMMONIA in the last 168 hours.  ABG    Component Value Date/Time   PHART 7.37 04/26/2023 1223   PCO2ART 46 04/26/2023 1223   PO2ART 83 04/26/2023 1223   HCO3 26.6 04/26/2023 1223   ACIDBASEDEF 8.8 (H) 04/26/2023 0500   O2SAT 97.3 04/26/2023 1223     Coagulation Profile: Recent Labs  Lab 04/25/23 1819  INR 1.4*    Cardiac Enzymes: Recent Labs  Lab 04/25/23 1819 04/26/23 1052 04/26/23 2028 04/27/23 0427  CKTOTAL 5,249* 6,344* 6,730* 6,705*    HbA1C: Hgb A1c MFr Bld  Date/Time Value Ref Range Status  04/26/2023 03:25 AM 5.1 4.8 - 5.6 % Final    Comment:    (NOTE) Pre diabetes:          5.7%-6.4%  Diabetes:              >6.4%  Glycemic control for   <7.0% adults with diabetes     CBG: Recent Labs  Lab 04/26/23 1601 04/26/23 1931 04/26/23 2355 04/27/23 0342 04/27/23 0740  GLUCAP 198* 166* 134* 106* 115*    Review of Systems:   N/A  Past Medical History:  He,  has a past medical history of EtOH dependence (HCC), History of echocardiogram, and Incarcerated inguinal hernia.   Surgical History:   Past Surgical History:  Procedure Laterality Date   INGUINAL HERNIA REPAIR Right 11/01/2016   Procedure: HERNIA REPAIR INGUINAL INCARCERATED;  Surgeon: Jordis Laneta FALCON, MD;  Location: [facility] ORS;  Service: General;  Laterality: Right;   NO PAST SURGERIES       Social History:   reports that he has been smoking cigarettes. His smokeless tobacco use includes chew. He reports current alcohol use of about 126.0 standard drinks of alcohol per week. He reports that he does not use drugs.   Family History:  His Family history is unknown by patient.   Allergies No Known Allergies   Home Medications  Prior to Admission medications   Not on File     Critical  care time: 40 minutes    Belva November, MD [facility] 04/27/2023 12:39 PM

## 2023-04-27 NOTE — Progress Notes (Signed)
 PHARMACY CONSULT NOTE - FOLLOW UP  Pharmacy Consult for Electrolyte Monitoring and Replacement   Recent Labs: Potassium (mmol/L)  Date Value  04/27/2023 4.9  08/23/2013 3.3 (L)   Magnesium  (mg/dL)  Date Value  98/88/7974 1.7  08/23/2013 1.4 (L)   Calcium  (mg/dL)  Date Value  98/88/7974 5.8 (LL)   Calcium , Total (mg/dL)  Date Value  94/89/7984 9.5   Albumin  (g/dL)  Date Value  98/88/7974 2.6 (L)  04/27/2023 2.5 (L)   Phosphorus (mg/dL)  Date Value  98/88/7974 6.2 (H)  08/23/2013 2.2 (L)   Sodium (mmol/L)  Date Value  04/27/2023 136  08/23/2013 128 (L)     Assessment: 68 y/o male with h/o SBO secondary to strangulated inguinal hernia s/p repair 2018, etoh abuse and seizures who is admitted with AMS, aspiration pneumonia, sepsis, AKI and rhabdomyolysis. Pharmacy is asked to follow and replace electrolytes while in CCU  Nutrition: Vital AF + FWF 30 mL per tube every 4 hours   Goal of Therapy:  Electrolytes WNL  Plan:  Mg 2 g IV x 1 F/u with AM labs.   Cathaleen GORMAN Blanch ,PharmD Clinical Pharmacist 04/27/2023 9:25 AM

## 2023-04-27 NOTE — Plan of Care (Signed)

## 2023-04-28 ENCOUNTER — Inpatient Hospital Stay: Payer: 59

## 2023-04-28 DIAGNOSIS — N179 Acute kidney failure, unspecified: Secondary | ICD-10-CM | POA: Diagnosis not present

## 2023-04-28 DIAGNOSIS — I214 Non-ST elevation (NSTEMI) myocardial infarction: Secondary | ICD-10-CM | POA: Diagnosis not present

## 2023-04-28 DIAGNOSIS — J9601 Acute respiratory failure with hypoxia: Secondary | ICD-10-CM | POA: Diagnosis not present

## 2023-04-28 DIAGNOSIS — M6282 Rhabdomyolysis: Secondary | ICD-10-CM | POA: Diagnosis not present

## 2023-04-28 DIAGNOSIS — J690 Pneumonitis due to inhalation of food and vomit: Secondary | ICD-10-CM

## 2023-04-28 DIAGNOSIS — G9341 Metabolic encephalopathy: Secondary | ICD-10-CM | POA: Diagnosis not present

## 2023-04-28 LAB — RENAL FUNCTION PANEL
Albumin: 2.4 g/dL — ABNORMAL LOW (ref 3.5–5.0)
Anion gap: 16 — ABNORMAL HIGH (ref 5–15)
BUN: 56 mg/dL — ABNORMAL HIGH (ref 8–23)
CO2: 23 mmol/L (ref 22–32)
Calcium: 6.8 mg/dL — ABNORMAL LOW (ref 8.9–10.3)
Chloride: 97 mmol/L — ABNORMAL LOW (ref 98–111)
Creatinine, Ser: 3.06 mg/dL — ABNORMAL HIGH (ref 0.61–1.24)
GFR, Estimated: 21 mL/min — ABNORMAL LOW (ref >60)
Glucose, Bld: 111 mg/dL — ABNORMAL HIGH (ref 70–99)
Phosphorus: 4 mg/dL (ref 2.5–4.6)
Potassium: 4 mmol/L (ref 3.5–5.1)
Sodium: 136 mmol/L (ref 135–145)

## 2023-04-28 LAB — BLOOD GAS, ARTERIAL
Acid-Base Excess: 1.7 mmol/L (ref 0.0–2.0)
Bicarbonate: 27.2 mmol/L (ref 20.0–28.0)
FIO2: 30 %
MECHVT: 500 mL
Mechanical Rate: 20
O2 Saturation: 96.8 %
PEEP: 5 cmH2O
Patient temperature: 37
pCO2 arterial: 45 mm[Hg] (ref 32–48)
pH, Arterial: 7.39 (ref 7.35–7.45)
pO2, Arterial: 85 mm[Hg] (ref 83–108)

## 2023-04-28 LAB — CBC
HCT: 28.6 % — ABNORMAL LOW (ref 39.0–52.0)
Hemoglobin: 9.7 g/dL — ABNORMAL LOW (ref 13.0–17.0)
MCH: 33.7 pg (ref 26.0–34.0)
MCHC: 33.9 g/dL (ref 30.0–36.0)
MCV: 99.3 fL (ref 80.0–100.0)
Platelets: 33 10*3/uL — ABNORMAL LOW (ref 150–400)
RBC: 2.88 MIL/uL — ABNORMAL LOW (ref 4.22–5.81)
RDW: 14.7 % (ref 11.5–15.5)
WBC: 17.3 10*3/uL — ABNORMAL HIGH (ref 4.0–10.5)
nRBC: 0.1 % (ref 0.0–0.2)

## 2023-04-28 LAB — GLUCOSE, CAPILLARY
Glucose-Capillary: 110 mg/dL — ABNORMAL HIGH (ref 70–99)
Glucose-Capillary: 111 mg/dL — ABNORMAL HIGH (ref 70–99)
Glucose-Capillary: 113 mg/dL — ABNORMAL HIGH (ref 70–99)
Glucose-Capillary: 137 mg/dL — ABNORMAL HIGH (ref 70–99)
Glucose-Capillary: 142 mg/dL — ABNORMAL HIGH (ref 70–99)
Glucose-Capillary: 148 mg/dL — ABNORMAL HIGH (ref 70–99)

## 2023-04-28 LAB — CK: Total CK: 3963 U/L — ABNORMAL HIGH (ref 49–397)

## 2023-04-28 LAB — MAGNESIUM: Magnesium: 2.2 mg/dL (ref 1.7–2.4)

## 2023-04-28 MED ORDER — ETOMIDATE 2 MG/ML IV SOLN
INTRAVENOUS | Status: AC
  Administered 2023-04-28: 20 mg
  Filled 2023-04-28: qty 10

## 2023-04-28 MED ORDER — ROCURONIUM BROMIDE 10 MG/ML (PF) SYRINGE
PREFILLED_SYRINGE | INTRAVENOUS | Status: AC
  Administered 2023-04-28: 100 mg
  Filled 2023-04-28: qty 10

## 2023-04-28 MED ORDER — FENTANYL BOLUS VIA INFUSION
50.0000 ug | INTRAVENOUS | Status: DC | PRN
  Administered 2023-04-28 (×2): 100 ug via INTRAVENOUS
  Administered 2023-04-28 – 2023-04-30 (×5): 50 ug via INTRAVENOUS
  Administered 2023-04-30: 100 ug via INTRAVENOUS
  Administered 2023-04-30 – 2023-05-01 (×2): 50 ug via INTRAVENOUS

## 2023-04-28 NOTE — Progress Notes (Signed)
 Dr. Aundria Rud gave order to remove arterial line since it is not working.

## 2023-04-28 NOTE — Progress Notes (Signed)
 PHARMACY CONSULT NOTE  Pharmacy Consult for Electrolyte Monitoring and Replacement   Recent Labs: Potassium (mmol/L)  Date Value  04/28/2023 4.0  08/23/2013 3.3 (L)   Magnesium  (mg/dL)  Date Value  98/87/7974 2.2  08/23/2013 1.4 (L)   Calcium  (mg/dL)  Date Value  98/87/7974 6.8 (L)   Calcium , Total (mg/dL)  Date Value  94/89/7984 9.5   Albumin  (g/dL)  Date Value  98/87/7974 2.4 (L)   Phosphorus (mg/dL)  Date Value  98/87/7974 4.0  08/23/2013 2.2 (L)   Sodium (mmol/L)  Date Value  04/28/2023 136  08/23/2013 128 (L)   Assessment: 68 y/o male with h/o SBO secondary to strangulated inguinal hernia s/p repair 2018, etoh abuse and seizures who is admitted with AMS, aspiration pneumonia, sepsis, AKI and rhabdomyolysis. Pharmacy is asked to follow and replace electrolytes while in CCU  Nutrition: Vital AF + FWF 30 mL per tube every 4 hours  Goal of Therapy:  Electrolytes WNL  Plan:  --No electrolyte replacement indicated at this time. Continue to monitor calcium  --Will re-check electrolytes with AM labs tomorrow  Marolyn KATHEE Mare 04/28/2023 7:04 AM

## 2023-04-28 NOTE — Progress Notes (Signed)
 Cardiology Progress Note   Patient Name: Curtis [name] Date of Encounter: 04/28/2023  Primary Cardiologist: Lonni Hanson, MD  Subjective   Intubated, sedated, hemodynamically stable. Objective   Inpatient Medications    Scheduled Meds:  Chlorhexidine  Gluconate Cloth  6 each Topical Nightly   folic acid   1 mg Per Tube Daily   free water   30 mL Per Tube Q4H   hydrocortisone  sod succinate (SOLU-CORTEF ) inj  50 mg Intravenous Q12H   insulin  aspart  0-15 Units Subcutaneous Q4H   multivitamin with minerals  1 tablet Per Tube Daily   mouth rinse  15 mL Mouth Rinse Q2H   pantoprazole  (PROTONIX ) IV  40 mg Intravenous QHS   [START ON 04/29/2023] thiamine  (VITAMIN B1) injection  100 mg Intravenous Q24H   Continuous Infusions:  doxycycline  (VIBRAMYCIN ) IV 100 mg (04/28/23 1034)   feeding supplement (VITAL AF 1.2 CAL) 50 mL/hr at 04/28/23 0900   fentaNYL  infusion INTRAVENOUS 50 mcg/hr (04/27/23 1525)   norepinephrine  (LEVOPHED ) Adult infusion Stopped (04/27/23 1720)   piperacillin -tazobactam (ZOSYN )  IV 12.5 mL/hr at 04/28/23 0900   propofol  (DIPRIVAN ) infusion 35 mcg/kg/min (04/28/23 0900)   thiamine  (VITAMIN B1) injection 500 mg (04/28/23 1033)   PRN Meds: docusate, midazolam , mouth rinse, polyethylene glycol   Vital Signs    Vitals:   04/28/23 0900 04/28/23 0930 04/28/23 1000 04/28/23 1030  BP: (!) 110/50 (!) 102/47 (!) 102/47 (!) 106/51  Pulse: 79 81 79 79  Resp: 20 20 20 20   Temp: 98.2 F (36.8 C) 98.1 F (36.7 C)    TempSrc:      SpO2: 94% 95% 95% 96%  Weight:      Height:        Intake/Output Summary (Last 24 hours) at 04/28/2023 1058 Last data filed at 04/28/2023 0900 Gross per 24 hour  Intake 3862.71 ml  Output 1530 ml  Net 2332.71 ml   Filed Weights   04/26/23 0416 04/27/23 0345 04/28/23 0423  Weight: 71.7 kg 76.1 kg 76.3 kg    Physical Exam   GEN: somewhat frail.  Intubated, sedated. HEENT: Grossly normal.  Neck: Supple, no JVD, carotid bruits, or  masses. Cardiac: RRR, no murmurs, rubs, or gallops. No clubbing, cyanosis, edema.  Radials 2+, DP/PT 2+ and equal bilaterally.  Respiratory:  Respirations regular and unlabored, coarse breath sounds bilat. GI: Distended, BS + x 4.  Winces to light palpation. MS: no deformity or atrophy. Skin: warm and dry, no rash. Neuro:  sedated. Psych: sedated, winces to abdominal palpation.  Labs    Chemistry Recent Labs  Lab 04/25/23 1819 04/26/23 0325 04/26/23 1705 04/27/23 0424 04/28/23 0411  NA 131*   < > 135 136 136  K 4.8   < > 4.8 4.9 4.0  CL 92*   < > 94* 96* 97*  CO2 16*   < > 26 25 23   GLUCOSE 286*   < > 208* 111* 111*  BUN 31*   < > 43* 48* 56*  CREATININE 1.91*   < > 2.64* 3.09* 3.06*  CALCIUM  8.3*   < > 6.1* 5.8* 6.8*  PROT 6.5  --   --  5.4*  --   ALBUMIN  3.5  --   --  2.5*  2.6* 2.4*  AST 284*  --   --  508*  --   ALT 83*  --   --  147*  --   ALKPHOS 125  --   --  79  --   BILITOT 1.1  --   --  1.2  --   GFRNONAA 37*   < > 25* 21* 21*  ANIONGAP 23*   < > 15 15 16*   < > = values in this interval not displayed.     Hematology Recent Labs  Lab 04/26/23 0325 04/27/23 0427 04/28/23 0411  WBC 6.4 20.6* 17.3*  RBC 3.67* 3.51* 2.88*  HGB 12.3* 11.7* 9.7*  HCT 35.8* 33.4* 28.6*  MCV 97.5 95.2 99.3  MCH 33.5 33.3 33.7  MCHC 34.4 35.0 33.9  RDW 13.2 14.3 14.7  PLT 193 75* 33*    Cardiac Enzymes  Recent Labs  Lab 04/25/23 1819 04/26/23 0753 04/26/23 1052 04/27/23 0955  TROPONINIHS 32* 573* 665* 310*      BNP    Component Value Date/Time   BNP 52 08/23/2013 1819   Lipids  Lab Results  Component Value Date   CHOL 127 04/27/2023   HDL 71 04/27/2023   LDLCALC 46 04/27/2023   TRIG 54 04/27/2023   TRIG 52 04/27/2023   CHOLHDL 1.8 04/27/2023    HbA1c  Lab Results  Component Value Date   HGBA1C 5.1 04/27/2023    Radiology    DG Chest Port 1 View Result Date: 04/28/2023 CLINICAL DATA:  Respiratory failure. EXAM: PORTABLE CHEST 1 VIEW  COMPARISON:  04/27/2023 FINDINGS: Endotracheal tube tip is 4.9 cm above the base of the carina. NG tube tip is in the stomach. Left IJ central line tip overlies expected region of the distal SVC. Interval increase in right base collapse/consolidation. Interval improvement in aeration at the left base. Cardiopericardial silhouette is at upper limits of normal for size. Telemetry leads overlie the chest. IMPRESSION: 1. Interval increase in right base collapse/consolidation. 2. Support apparatus as described. Electronically Signed   By: Camellia Candle M.D.   On: 04/28/2023 08:03   DG Chest Port 1 View Result Date: 04/27/2023 CLINICAL DATA:  Acute respiratory failure with hypoxia. EXAM: PORTABLE CHEST 1 VIEW COMPARISON:  04/26/2023 FINDINGS: Endotracheal tube tip is 4.1 cm above the base of the carina. NG tube tip is in the gastric fundus. Left IJ central line tip overlies the low SVC near the junction with the RA. Mild asymmetric elevation right hemidiaphragm with right base collapse/consolidation tiny right pleural effusion, more prominent in the interval. Trace retrocardiac atelectasis or infiltrate noted. Telemetry leads overlie the chest. IMPRESSION: 1. Interval increase in right base collapse/consolidation with tiny right pleural effusion. 2. Trace retrocardiac atelectasis or infiltrate. Electronically Signed   By: Camellia Candle M.D.   On: 04/27/2023 08:13   US  SCROTUM W/DOPPLER Result Date: 04/26/2023 CLINICAL DATA:  Right testicular swelling. EXAM: SCROTAL ULTRASOUND DOPPLER ULTRASOUND OF THE TESTICLES TECHNIQUE: Complete ultrasound examination of the testicles, epididymis, and other scrotal structures was performed. Color and spectral Doppler ultrasound were also utilized to evaluate blood flow to the testicles. COMPARISON:  CT abdomen pelvis from yesterday. Scrotal ultrasound dated June 22, 2016. FINDINGS: Right testicle Measurements: 4.3 x 2.1 x 3.0 cm. No mass or microlithiasis visualized. Left testicle  Measurements: 3.9 x 1.7 x 2.5 cm. No mass or microlithiasis visualized. Right epididymis:  Normal in size and appearance. Left epididymis:  Hypervascular. Hydrocele:  Small right hydrocele. Varicocele:  None visualized. Pulsed Doppler interrogation of both testes demonstrates normal low resistance arterial and venous waveforms bilaterally. Large right inguinal hernia containing bowel and fat. IMPRESSION: 1. Large right inguinal hernia containing bowel and fat. 2. Small right hydrocele. 3. Left epididymitis. Electronically Signed   By: Elsie ONEIDA Jud CHRISTELLA.D.  On: 04/26/2023 18:46   DG Abd 1 View Result Date: 04/26/2023 CLINICAL DATA:  NG placement. EXAM: ABDOMEN - 1 VIEW COMPARISON:  Abdominal radiograph dated 11/01/2016. FINDINGS: Enteric tube with tip in the left upper abdomen in the region of the gastric fundus. Persistent dilated small bowel loops measure up to 5.5 cm in caliber. IMPRESSION: Enteric tube with tip in the gastric fundus. Electronically Signed   By: Vanetta Chou M.D.   On: 04/26/2023 15:22   DG Chest Port 1 View Result Date: 04/26/2023 CLINICAL DATA:  Central line placement. EXAM: PORTABLE CHEST 1 VIEW COMPARISON:  04/25/2023 FINDINGS: Endotracheal tube tip is approximately 3.9 cm above the base of the carina. NG tube tip is in the stomach. Left IJ central line tip overlies the mid to distal SVC level. The lungs are clear without focal pneumonia, edema, pneumothorax or pleural effusion. Interstitial markings are diffusely coarsened with chronic features. The cardiopericardial silhouette is within normal limits for size. Telemetry leads overlie the chest. IMPRESSION: 1. Left IJ central line tip overlies the mid to distal SVC level. No pneumothorax. 2. Endotracheal tube tip is approximately 3.9 cm above the base of the carina. Electronically Signed   By: Camellia Candle M.D.   On: 04/26/2023 05:55   CT CHEST ABDOMEN PELVIS WO CONTRAST Result Date: 04/25/2023 CLINICAL DATA:  Sepsis  unresponsive EXAM: CT CHEST, ABDOMEN AND PELVIS WITHOUT CONTRAST TECHNIQUE: Multidetector CT imaging of the chest, abdomen and pelvis was performed following the standard protocol without IV contrast. RADIATION DOSE REDUCTION: This exam was performed according to the departmental dose-optimization program which includes automated exposure control, adjustment of the mA and/or kV according to patient size and/or use of iterative reconstruction technique. COMPARISON:  Chest x-ray 04/25/2023 FINDINGS: CT CHEST FINDINGS Cardiovascular: Limited assessment without intravenous contrast. Moderate aortic atherosclerosis. No aneurysm. Normal cardiac size. No pericardial effusion. Mediastinum/Nodes: Endotracheal tube tip is just above the carina. Enteric tube tip in the gastric fundus. No suspicious lymph nodes. Lungs/Pleura: Emphysema. Multifocal bilateral ground-glass densities consistent with respiratory infection/pneumonia. 4 mm right upper lobe pulmonary nodule on series 4, image 76. There are other small pulmonary nodules noted. Diffuse bilateral bronchial wall thickening. Mild mucous plugging in the right lower lobe. Musculoskeletal: No acute or suspicious osseous abnormality CT ABDOMEN PELVIS FINDINGS Hepatobiliary: Hepatic steatosis. Diffuse increased density in the gallbladder without calcified stone. No biliary dilatation Pancreas: Unremarkable. No pancreatic ductal dilatation or surrounding inflammatory changes. Spleen: Normal in size without focal abnormality. Adrenals/Urinary Tract: Adrenal glands are within normal limits. Kidneys show no hydronephrosis. Bladder is decompressed by Foley catheter. Stomach/Bowel: Moderate air distension of the stomach. Mild air and fluid-filled small bowel without obstruction or acute bowel wall thickening. Negative appendix. Vascular/Lymphatic: Advanced aortic atherosclerosis. No aneurysm. No suspicious lymph nodes Reproductive: Negative prostate Other: Negative for pelvic effusion  or free air. Large right inguinal hernia containing mesenteric fat and small bowel but no obstruction or incarceration. Musculoskeletal: Degenerative changes at L5-S1. No acute osseous abnormality. IMPRESSION: 1. Diffuse bilateral bronchial wall thickening with mild mucous plugging in the right lower lobe. Scattered bilateral ground-glass opacities consistent with respiratory infection/pneumonia, to include atypical organisms. 2. Emphysema. 3. Hepatic steatosis. 4. Large right inguinal hernia containing mesenteric fat and small bowel but no obstruction or incarceration. 5. Aortic atherosclerosis. 6. 4 mm right upper lobe pulmonary nodule. No follow-up needed if patient is low-risk (and has no known or suspected primary neoplasm). Non-contrast chest CT can be considered in 12 months if patient is high-risk. This recommendation  follows the consensus statement: Guidelines for Management of Incidental Pulmonary Nodules Detected on CT Images: From the Fleischner Society 2017; Radiology 2017; 284:228-243. Aortic Atherosclerosis (ICD10-I70.0) and Emphysema (ICD10-J43.9). Electronically Signed   By: Luke Bun M.D.   On: 04/25/2023 21:39   CT Cervical Spine Wo Contrast Result Date: 04/25/2023 CLINICAL DATA:  Found unresponsive EXAM: CT CERVICAL SPINE WITHOUT CONTRAST TECHNIQUE: Multidetector CT imaging of the cervical spine was performed without intravenous contrast. Multiplanar CT image reconstructions were also generated. RADIATION DOSE REDUCTION: This exam was performed according to the departmental dose-optimization program which includes automated exposure control, adjustment of the mA and/or kV according to patient size and/or use of iterative reconstruction technique. COMPARISON:  None Available. FINDINGS: Alignment: Within normal limits. Skull base and vertebrae: 7 cervical segments are well visualized. Extensive disc space narrowing and osteophytic changes are seen. No acute fracture or acute facet abnormality  is noted. Degenerative facet hypertrophic changes are noted as well. Soft tissues and spinal canal: Surrounding soft tissue structures are within normal limits. Endotracheal tube and gastric catheter are seen in satisfactory position. Upper chest: Visualized lung apices are unremarkable. Other: None IMPRESSION: Extensive osteophytic and facet hypertrophic changes are noted without acute abnormality. Electronically Signed   By: Oneil Devonshire M.D.   On: 04/25/2023 21:26   CT Head Wo Contrast Result Date: 04/25/2023 CLINICAL DATA:  Mental status change, persistent or worsening. Found unresponsive EXAM: CT HEAD WITHOUT CONTRAST TECHNIQUE: Contiguous axial images were obtained from the base of the skull through the vertex without intravenous contrast. RADIATION DOSE REDUCTION: This exam was performed according to the departmental dose-optimization program which includes automated exposure control, adjustment of the mA and/or kV according to patient size and/or use of iterative reconstruction technique. COMPARISON:  05/14/2017 FINDINGS: Brain: Mild age related volume loss. Mild chronic small vessel disease throughout the deep white matter. No acute intracranial abnormality. Specifically, no hemorrhage, hydrocephalus, mass lesion, acute infarction, or significant intracranial injury. Vascular: No hyperdense vessel or unexpected calcification. Skull: No acute calvarial abnormality. Sinuses/Orbits: Mucosal thickening throughout the paranasal sinuses. 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 This is unchanged since prior study. Other: None IMPRESSION: Atrophy, chronic microvascular disease. No acute intracranial abnormality. Chronic sinusitis. Electronically Signed   By: Franky Crease M.D.   On: 04/25/2023 21:24   DG Chest Port 1 View Result Date: 04/25/2023 CLINICAL DATA:  Questionable sepsis. EXAM: PORTABLE CHEST 1 VIEW COMPARISON:  Chest x-ray 06/21/2016 FINDINGS: Endotracheal tube tip is 2.6 cm above the  carina. The cardiomediastinal silhouette is within normal limits. The lungs are clear. There is no pleural effusion or pneumothorax. No acute fractures are seen. IMPRESSION: 1. Endotracheal tube tip is 2.6 cm above the carina. 2. No acute cardiopulmonary process. Electronically Signed   By: Greig Pique M.D.   On: 04/25/2023 18:53     Telemetry    rsr - Personally Reviewed  Cardiac Studies   2D Echocardiogram 1.10.2025    1. Left ventricular ejection fraction, by estimation, is >55%. The left  ventricle has normal function. Left ventricular endocardial border not  optimally defined to evaluate regional wall motion. Left ventricular  diastolic function could not be  evaluated.   2. Right ventricular systolic function is mildly reduced. The right  ventricular size is normal. Mildly increased right ventricular wall  thickness.   3. The mitral valve is degenerative. Trivial mitral valve regurgitation.  Unable to assess for mitral stenosis.   4. The aortic valve was not well visualized. Aortic valve  regurgitation  is not visualized. Aortic valve gradient not assessed.   Patient Profile     68 y.o. male with a history of EtOH abuse, who presented 1/9 w/ unresponsiveness, hypothermia (61F), EtOH intoxication, lactic acidosis, resp failure req intubation, rhabdomyolysis, and troponin elevation.  Echo 1/10 w/ nl EF.   Assessment & Plan    1.  Demand Ischemia:  Pt admitted w/ unresponsiveness in the setting of profound hypothermia, hypoglycemia, hypotension, and alcohol intoxication (EtOH 177).  Presenting ECG w/ IVCD, 1st deg AVB, and prolonged QT.  Trops elevated @ 32  573  665.  CK 5249.  Echo performed 1/10 showed nl LV/RV fxn w/o significant valvular dzs.  Troponin elevation most consistent with demand ischemia and rhabdo likely playing a role.  Heparin /ASA held 2/2 thrombocytopenia. Pressures soft but stable   No ? blocker. No statin in setting of LFT abnormalities.  LDL only 46.  Pending  recovery, can consider outpt ischemic evaluation for risk stratification.   2.  Hypothermia:  Resolved.   3.  Hypoglycemia/hyperglycemia:  in setting of unresponsiveness/alcoholism/hypothermia.  Gluc 96 this AM.  A1c 5.1.   4.  Acute renal failure:  BUN/Creat relatively stable @ 56/30.6 this AM.  IVF per CCM.  Avoiding nephrotoxic agents.   5.  Hyperkalemia:  resolved.  K 4.0 this AM.   6.  Rhabdomyolysis:  In setting of unclear duration of down time.  CK up to 6730 1/10.  3963 this AM.   7.  Hypocalcemia:  Ca 6.8.  albumin  2.4.  Per CCM.   8.  Transaminitis:  In setting of above.  Follow.  Avoiding hepatotoxic agents.  9.  Normocytic anemia/thrombocytopenia:  H/H 9.7/28.6 w/ plts of 33k.  Per CCM.  Heparin danae d/c'd 1/11.  Signed, Lonni Meager, NP  04/28/2023, 10:58 AM    For questions or updates, please contact   Please consult [email or web address] for contact info under Cardiology/STEMI.

## 2023-04-28 NOTE — Progress Notes (Signed)
 NAME:  Curtis [name], MRN:  [id number], DOB:  [birth year], LOS: 3 ADMISSION DATE:  04/25/2023,  CHIEF COMPLAINT: Severe Hypothermia  History of Present Illness:   68 y.o male with significant PMH of incarcerated inguinal hernia s/p repair, SBO, alcohol withdrawal seizure, EtOH abuse who presented to the ED with chief complaints of unresponsiveness.   Per ED report, EMS was dispatched to patient's residence by a neighbor who found the patient unconscious.  On EMS arrival, patient was found unresponsive on floor of bedroom. Neighbor report LKNW was 3 days ago. Patient was initially combative and would tense resist any treatment. EMS report they were unable to obtain a temperature or spo2 due to hypothermia and unable to obtain bp due to combative resistance. patient initial cbg registered at 45 treated with 1mg  glucagon repeat reading were in the upper 20's   ED Course: Initial vital signs showed HR of 55 beats/minute, BP 99/47 mm Hg, the RR 22 breaths/minute, and the oxygen saturation 100% on NRB and a temperature of 90F (25C). Patient intubated in for airway protection. Pertinent Labs/Diagnostics Findings: Na+/ K+:131/4.8  Glucose: 286 BUN/Cr.: 31/1.91 CO2 1, Anion Gap 23, AST/ALT:284/83 WBC: 15.7K/L with neutrophil predominance  Lactic acid: >9.0 COVID PCR: Negative,  troponin: 32  CK 5249 ETOH:177 VBG: pO2 46.2; pCO2 72; pH 6.96;  HCO3 16.2, %O2 Sat 46.6.  CXR> CTH> CTA Chest> CT Abd/pelvis>see results below Medication administered in the ZI:Ejupzwu given 30 cc/kg of fluids and started on broad-spectrum antibiotics with Ceftriaxone  for suspected sepsis with septic shock.   Pertinent  Medical History  -incarcerated inguinal hernia s/p repair -EtOH abuse with withdrawal seizures  Significant Hospital Events: Including procedures, antibiotic start and stop dates in addition to other pertinent events   1/9: intubated, admit to ICU 1/10: rewarmed, remained intubated 1/11: opens eyes to  verbal stimuli but does not follow commands, attempted SBT, mental status barrier to extubation 1/12: sedated this AM, increased secretions from the ETT  Interim History / Subjective:  Remains sedated, does not follow commands.  Objective   Blood pressure (!) 116/55, pulse 89, temperature 98.4 F (36.9 C), resp. rate (!) 21, height 5' 7 (1.702 m), weight 76.3 kg, SpO2 95%.    Vent Mode: PRVC FiO2 (%):  [30 %-40 %] 30 % Set Rate:  [20 bmp-22 bmp] 20 bmp Vt Set:  [500 mL] 500 mL PEEP:  [5 cmH20] 5 cmH20 Pressure Support:  [5 cmH20] 5 cmH20 Plateau Pressure:  [12 cmH20-20 cmH20] 20 cmH20   Intake/Output Summary (Last 24 hours) at 04/28/2023 0733 Last data filed at 04/28/2023 0604 Gross per 24 hour  Intake 4343.97 ml  Output 1640 ml  Net 2703.97 ml   Filed Weights   04/26/23 0416 04/27/23 0345 04/28/23 0423  Weight: 71.7 kg 76.1 kg 76.3 kg    Examination: Physical Exam Constitutional:      General: He is not in acute distress.    Appearance: He is ill-appearing.  HENT:     Mouth/Throat:     Comments: ETT in place Cardiovascular:     Rate and Rhythm: Normal rate and regular rhythm.  Pulmonary:     Comments: Ventilated breath sounds bilaterally Abdominal:     Palpations: Abdomen is soft.  Musculoskeletal:     Right lower leg: No edema.     Left lower leg: No edema.  Neurological:     Mental Status: He is disoriented.     Deep Tendon Reflexes: Reflexes normal.     Assessment &  Plan:   Neurology #Toxic Metabolic Encephalopathy #EtOH Intoxication and Abuse #History of EtOH withdrawal seizure  Patient with GCS of 3 on presentation to the ED with encephalopathy requiring intubation. Did have an alcohol level of 177 and was likely intoxicated and subsequently developed severe hypothermia from exposure to extreme cold. History of withdrawal seizures in the past noted.  -high dose thiamine  and folate -propofol  gtt and midazolam  PRN for sedation and prevention of EtOH  withdrawal -Fentanyl  for analgesia per CPOT -goal RASS -1 -Daily wake up assessment -monitor for EtOH withdrawal  Cardiovascular #Circulatory Shock #NSTEMI #Severe Hypothermia  Not known to have CAD but is at risk. Circulatory shock secondary to severe hypothermia with sedation related hypotension contributing as well. Hypoperfusion noted with significant elevation in lactic acid, now improving. Troponin checked and is elevated. Heparin  initiated and cardiology consultation obtained. Overall suspect troponin leak from skeletal muscle over cardiac muscle, and with drop in platelets will stop anti-coagulation for now. Vasopressor requirements are sedation related.  -goal MAP > 65 mmHg -trend troponin to peak  Pulmonary #Acute Hypoxic Respiratory Failure  In the setting of severe hypothermia and encephalopathy requiring intubation and mechanical ventilation. On minimal ventilator settings at the moment with the improvement in his metabolic acidosis. He did pass SBT but mental status remains barrier to extubation. Now with increased secretions through ETT consistent with aspiration pneumonia. ABG this AM within normal  -Full vent support, implement lung protective strategies -Plateau pressures less than 30 cm H20 -Wean FiO2 & PEEP as tolerated to maintain O2 sats >92% -Follow intermittent Chest X-ray & ABG as needed -Spontaneous Breathing Trials when respiratory parameters met and mental status permits -Implement VAP Bundle -Prn Bronchodilators  Gastrointestinal #Elevated transaminases  SUP in place, on tube feeds. Avoiding hepatotoxins.  Renal #AKI #Metabolic Acidosis #Rhabdomyolysis #HyperKalemia #HypoCalcemia #Severe Hypothermia  Found down for an unknown length of time with severe hypothermia. Does have significant elevation in CK consistent with rhabdomyolysis. AKI consistent with pre-renal azotemia and direct tubular injury from rhabdo, likely developed ATN. Has been  volume resuscitated and overall fluid balance is up, will stop IV fluids today and allow for renal recovery. Hyperkalemia improved, no acidosis noted on ABG, and no current indications for dialysis.   -continue K binder -stop IV fluids -monitor acid base status -avoid nephrotoxins as able  Endocrine #Severe Hypothermia  In the setting of alcohol intoxication and exposure. Thyroid function notes sick euthyroid.  -wean stress dose steroids  Hem/Onc  Heparin  gtt for management of ACS has been held given thrombocytopenia. This is not consistent with HIT given hyper acute drop.  -SCD for DVT prophylaxis  ID #Aspiration Pneumonia  Found down with high potential and risk for aspiration. Respiratory cultures sent and growing Proteus and Moraxella.  -continue zosyn  -MRSA negative  Best Practice (right click and Reselect all SmartList Selections daily)   Diet/type: tubefeeds DVT prophylaxis SCD Pressure ulcer(s): N/A GI prophylaxis: PPI Lines: Central line Foley:  Yes, and it is still needed Code Status:  full code Last date of multidisciplinary goals of care discussion [04/28/2023]  Labs   CBC: Recent Labs  Lab 04/25/23 1819 04/26/23 0325 04/27/23 0427 04/28/23 0411  WBC 15.7* 6.4 20.6* 17.3*  NEUTROABS 13.5*  --   --   --   HGB 13.3 12.3* 11.7* 9.7*  HCT 40.1 35.8* 33.4* 28.6*  MCV 99.0 97.5 95.2 99.3  PLT 205 193 75* 33*    Basic Metabolic Panel: Recent Labs  Lab 04/26/23 0325 04/26/23 1052  04/26/23 1705 04/27/23 0424 04/27/23 0427 04/28/23 0411  NA 140 136 135 136  --  136  K 4.6 5.7* 4.8 4.9  --  4.0  CL 100 96* 94* 96*  --  97*  CO2 16* 21* 26 25  --  23  GLUCOSE 94 190* 208* 111*  --  111*  BUN 32* 40* 43* 48*  --  56*  CREATININE 1.79* 2.37* 2.64* 3.09*  --  3.06*  CALCIUM  7.1* 6.2* 6.1* 5.8*  --  6.8*  MG 2.0  --   --   --  1.7 2.2  PHOS 7.7*  --   --  6.2*  --  4.0   GFR: Estimated Creatinine Clearance: 21.3 mL/min (A) (by C-G formula based  on SCr of 3.06 mg/dL (H)). Recent Labs  Lab 04/25/23 1819 04/25/23 1820 04/26/23 0325 04/26/23 0812 04/27/23 0424 04/27/23 0427 04/27/23 1150 04/27/23 1614 04/27/23 2210 04/28/23 0411  PROCALCITON  --   --   --   --  16.15  --   --   --   --   --   WBC 15.7*  --  6.4  --   --  20.6*  --   --   --  17.3*  LATICACIDVEN  --    < > 6.7*   < >  --  1.2 2.0* 1.1 1.0  --    < > = values in this interval not displayed.    Liver Function Tests: Recent Labs  Lab 04/25/23 1819 04/27/23 0424 04/28/23 0411  AST 284* 508*  --   ALT 83* 147*  --   ALKPHOS 125 79  --   BILITOT 1.1 1.2  --   PROT 6.5 5.4*  --   ALBUMIN  3.5 2.5*  2.6* 2.4*   No results for input(s): LIPASE, AMYLASE in the last 168 hours. No results for input(s): AMMONIA in the last 168 hours.  ABG    Component Value Date/Time   PHART 7.37 04/26/2023 1223   PCO2ART 46 04/26/2023 1223   PO2ART 83 04/26/2023 1223   HCO3 26.6 04/26/2023 1223   ACIDBASEDEF 8.8 (H) 04/26/2023 0500   O2SAT 97.3 04/26/2023 1223     Coagulation Profile: Recent Labs  Lab 04/25/23 1819  INR 1.4*    Cardiac Enzymes: Recent Labs  Lab 04/25/23 1819 04/26/23 1052 04/26/23 2028 04/27/23 0427 04/28/23 0411  CKTOTAL 5,249* 6,344* 6,730* 6,705* 3,963*    HbA1C: Hgb A1c MFr Bld  Date/Time Value Ref Range Status  04/27/2023 04:24 AM 5.1 4.8 - 5.6 % Final    Comment:    (NOTE) Pre diabetes:          5.7%-6.4%  Diabetes:              >6.4%  Glycemic control for   <7.0% adults with diabetes   04/26/2023 03:25 AM 5.1 4.8 - 5.6 % Final    Comment:    (NOTE) Pre diabetes:          5.7%-6.4%  Diabetes:              >6.4%  Glycemic control for   <7.0% adults with diabetes     CBG: Recent Labs  Lab 04/27/23 1116 04/27/23 1613 04/27/23 1939 04/28/23 0009 04/28/23 0418  GLUCAP 167* 110* 144* 148* 113*    Review of Systems:   N/A  Past Medical History:  He,  has a past medical history of EtOH dependence  (HCC), History of echocardiogram, and Incarcerated  inguinal hernia.   Surgical History:   Past Surgical History:  Procedure Laterality Date   INGUINAL HERNIA REPAIR Right 11/01/2016   Procedure: HERNIA REPAIR INGUINAL INCARCERATED;  Surgeon: Jordis Laneta FALCON, MD;  Location: [facility] ORS;  Service: General;  Laterality: Right;   NO PAST SURGERIES       Social History:   reports that he has been smoking cigarettes. His smokeless tobacco use includes chew. He reports current alcohol use of about 126.0 standard drinks of alcohol per week. He reports that he does not use drugs.   Family History:  His Family history is unknown by patient.   Allergies No Known Allergies   Home Medications  Prior to Admission medications   Not on File     Critical care time: 41 minutes   Belva November, MD [facility] 04/28/2023 8:43 AM

## 2023-04-28 NOTE — Procedures (Addendum)
 INTUBATION PROCEDURE NOTE  Malcome Ambrocio  [id number]  01/19/1956  Date:04/28/23  Time:10:10 PM   Provider Performing:Emmalia Heyboer A Jovanne Riggenbach   Procedure: Intubation (31500)  Indication(s) Respiratory Failure, ETT dislodgement  Consent Unable to obtain consent due to emergent nature of procedure.   Anesthesia Etomidate  and Rocuronium    Time Out Verified patient identification, verified procedure, site/side was marked, verified correct patient position, special equipment/implants available, medications/allergies/relevant history reviewed, required imaging and test results available.   Sterile Technique Usual hand hygeine, masks, and gloves were used   Procedure Description Patient positioned in bed supine.  Sedation given as noted above.  Patient was intubated with endotracheal tube using Glidescope.  View was Grade 1 full glottis .  Number of attempts was 1.  Colorimetric CO2 detector was consistent with tracheal placement.   Complications/Tolerance None; patient tolerated the procedure well. Chest X-ray is ordered to verify placement.  EBL Minimal  Specimen(s) None   Almarie Nose, DNP, CCRN, FNP-C, AGACNP-BC Acute Care & Family Nurse Practitioner  [facility]  See Amion for personal pager PCCM on call pager [phone] until 7 am

## 2023-04-28 NOTE — Progress Notes (Signed)
 SBT completed. Patient following commands, agitated attempting to pull ETT out. Patient still has a lot of secretions from ETT. RN made Dr. Aundria Rud aware of all mentioned above and MD gave order to restart sedation.

## 2023-04-29 DIAGNOSIS — G9341 Metabolic encephalopathy: Secondary | ICD-10-CM | POA: Diagnosis not present

## 2023-04-29 DIAGNOSIS — R579 Shock, unspecified: Secondary | ICD-10-CM | POA: Diagnosis not present

## 2023-04-29 LAB — CBC
HCT: 29.7 % — ABNORMAL LOW (ref 39.0–52.0)
Hemoglobin: 9.9 g/dL — ABNORMAL LOW (ref 13.0–17.0)
MCH: 32.7 pg (ref 26.0–34.0)
MCHC: 33.3 g/dL (ref 30.0–36.0)
MCV: 98 fL (ref 80.0–100.0)
Platelets: 37 10*3/uL — ABNORMAL LOW (ref 150–400)
RBC: 3.03 MIL/uL — ABNORMAL LOW (ref 4.22–5.81)
RDW: 14.8 % (ref 11.5–15.5)
WBC: 11.4 10*3/uL — ABNORMAL HIGH (ref 4.0–10.5)
nRBC: 0.3 % — ABNORMAL HIGH (ref 0.0–0.2)

## 2023-04-29 LAB — RENAL FUNCTION PANEL
Albumin: 2.5 g/dL — ABNORMAL LOW (ref 3.5–5.0)
Anion gap: 16 — ABNORMAL HIGH (ref 5–15)
BUN: 61 mg/dL — ABNORMAL HIGH (ref 8–23)
CO2: 23 mmol/L (ref 22–32)
Calcium: 6.8 mg/dL — ABNORMAL LOW (ref 8.9–10.3)
Chloride: 101 mmol/L (ref 98–111)
Creatinine, Ser: 2.8 mg/dL — ABNORMAL HIGH (ref 0.61–1.24)
GFR, Estimated: 24 mL/min — ABNORMAL LOW (ref >60)
Glucose, Bld: 112 mg/dL — ABNORMAL HIGH (ref 70–99)
Phosphorus: 4.3 mg/dL (ref 2.5–4.6)
Potassium: 3.9 mmol/L (ref 3.5–5.1)
Sodium: 140 mmol/L (ref 135–145)

## 2023-04-29 LAB — LEGIONELLA PNEUMOPHILA SEROGP 1 UR AG: L. pneumophila Serogp 1 Ur Ag: NEGATIVE

## 2023-04-29 LAB — CULTURE, RESPIRATORY W GRAM STAIN

## 2023-04-29 LAB — GLUCOSE, CAPILLARY
Glucose-Capillary: 114 mg/dL — ABNORMAL HIGH (ref 70–99)
Glucose-Capillary: 126 mg/dL — ABNORMAL HIGH (ref 70–99)
Glucose-Capillary: 130 mg/dL — ABNORMAL HIGH (ref 70–99)
Glucose-Capillary: 144 mg/dL — ABNORMAL HIGH (ref 70–99)
Glucose-Capillary: 157 mg/dL — ABNORMAL HIGH (ref 70–99)
Glucose-Capillary: 160 mg/dL — ABNORMAL HIGH (ref 70–99)
Glucose-Capillary: 91 mg/dL (ref 70–99)

## 2023-04-29 LAB — CK: Total CK: 2090 U/L — ABNORMAL HIGH (ref 49–397)

## 2023-04-29 LAB — VOLATILES,BLD-ACETONE,ETHANOL,ISOPROP,METHANOL
Acetone, blood: <0.01 g/dL (ref 0.000–0.010)
Ethanol, blood: <0.01 g/dL (ref 0.000–0.010)
Isopropanol, blood: <0.01 g/dL (ref 0.000–0.010)
Methanol, blood: <0.01 g/dL (ref 0.000–0.010)

## 2023-04-29 LAB — MAGNESIUM: Magnesium: 2.5 mg/dL — ABNORMAL HIGH (ref 1.7–2.4)

## 2023-04-29 MED ORDER — LACTATED RINGERS IV BOLUS
500.0000 mL | Freq: Once | INTRAVENOUS | Status: AC
  Administered 2023-04-29: 500 mL via INTRAVENOUS

## 2023-04-29 MED ORDER — HYDROCORTISONE SOD SUC (PF) 100 MG IJ SOLR
50.0000 mg | Freq: Two times a day (BID) | INTRAMUSCULAR | Status: DC
  Administered 2023-04-29 – 2023-04-30 (×3): 50 mg via INTRAVENOUS
  Filled 2023-04-29 (×3): qty 2

## 2023-04-29 MED ORDER — LACTATED RINGERS IV SOLN: INTRAVENOUS | Status: AC

## 2023-04-29 MED ORDER — NOREPINEPHRINE 16 MG/250ML-% IV SOLN
0.0000 ug/min | INTRAVENOUS | Status: DC
  Filled 2023-04-29: qty 250

## 2023-04-29 MED ORDER — CHLORDIAZEPOXIDE HCL 25 MG PO CAPS
25.0000 mg | ORAL_CAPSULE | Freq: Three times a day (TID) | ORAL | Status: AC
  Administered 2023-04-30 – 2023-05-01 (×3): 25 mg
  Filled 2023-04-29 (×3): qty 1

## 2023-04-29 MED ORDER — POLYETHYLENE GLYCOL 3350 17 G PO PACK
17.0000 g | PACK | Freq: Every day | ORAL | Status: DC
  Filled 2023-04-29: qty 1

## 2023-04-29 MED ORDER — DOCUSATE SODIUM 50 MG/5ML PO LIQD: 100.0000 mg | Freq: Two times a day (BID) | ORAL | Status: DC

## 2023-04-29 MED ORDER — CHLORDIAZEPOXIDE HCL 25 MG PO CAPS
25.0000 mg | ORAL_CAPSULE | ORAL | Status: AC
  Administered 2023-05-01 – 2023-05-02 (×2): 25 mg
  Filled 2023-04-29 (×2): qty 1

## 2023-04-29 MED ORDER — QUETIAPINE FUMARATE 25 MG PO TABS
50.0000 mg | ORAL_TABLET | Freq: Two times a day (BID) | ORAL | Status: DC
  Administered 2023-04-29 – 2023-05-01 (×5): 50 mg
  Filled 2023-04-29 (×5): qty 2

## 2023-04-29 MED ORDER — CHLORDIAZEPOXIDE HCL 25 MG PO CAPS
25.0000 mg | ORAL_CAPSULE | Freq: Four times a day (QID) | ORAL | Status: AC
  Administered 2023-04-29 – 2023-04-30 (×4): 25 mg
  Filled 2023-04-29 (×4): qty 1

## 2023-04-29 MED ORDER — CHLORDIAZEPOXIDE HCL 25 MG PO CAPS
25.0000 mg | ORAL_CAPSULE | Freq: Every day | ORAL | Status: AC
  Administered 2023-05-02: 25 mg
  Filled 2023-04-29: qty 1

## 2023-04-29 MED ORDER — THIAMINE HCL 100 MG/ML IJ SOLN: 100.0000 mg | Freq: Once | INTRAMUSCULAR | Status: DC

## 2023-04-29 NOTE — Progress Notes (Signed)
 PHARMACY CONSULT NOTE  Pharmacy Consult for Electrolyte Monitoring and Replacement   Recent Labs: Potassium (mmol/L)  Date Value  04/29/2023 3.9  08/23/2013 3.3 (L)   Magnesium  (mg/dL)  Date Value  98/86/7974 2.5 (H)  08/23/2013 1.4 (L)   Calcium  (mg/dL)  Date Value  98/86/7974 6.8 (L)   Calcium , Total (mg/dL)  Date Value  94/89/7984 9.5   Albumin  (g/dL)  Date Value  98/86/7974 2.5 (L)   Phosphorus (mg/dL)  Date Value  98/86/7974 4.3  08/23/2013 2.2 (L)   Sodium (mmol/L)  Date Value  04/29/2023 140  08/23/2013 128 (L)   Assessment: 68 y/o male with h/o SBO secondary to strangulated inguinal hernia s/p repair 2018, etoh abuse and seizures who is admitted with AMS, aspiration pneumonia, sepsis, AKI and rhabdomyolysis. Pharmacy is asked to follow and replace electrolytes while in CCU  Nutrition: Vital AF + FWF 30 mL per tube every 4 hours  Goal of Therapy:  Electrolytes WNL  Plan:  --No electrolyte replacement indicated at this time. Continue to monitor calcium  --Will re-check electrolytes with AM labs tomorrow  Curtis [name] 04/29/2023 7:27 AM

## 2023-04-29 NOTE — Progress Notes (Signed)
 PHARMACY - PHYSICIAN COMMUNICATION CRITICAL VALUE ALERT - BLOOD CULTURE IDENTIFICATION (BCID)  Curtis [name] is an 68 y.o. male who presented to [facility] on 04/25/2023 with a chief complaint of unresponsiveness(now has ROSC)  Assessment:  1/4 gram negative coccobacilli(was previously called in incorrectly as GPC)  Name of physician (or Provider) Contacted: Assakar, JP  Current antibiotics: zosyn , doxycycline   Changes to prescribed antibiotics recommended:  Recommendations accepted by provider Will discontinue doxycycline  since no concern for MRSA. Continue zosyn . Will await finalization of cultures and de-escalate as appropriate.  Results for orders placed or performed during the hospital encounter of 04/25/23  Blood Culture ID Panel (Reflexed) (Collected: 04/25/2023  6:38 PM)  Result Value Ref Range   Enterococcus faecalis NOT DETECTED NOT DETECTED   Enterococcus Faecium NOT DETECTED NOT DETECTED   Listeria monocytogenes NOT DETECTED NOT DETECTED   Staphylococcus species NOT DETECTED NOT DETECTED   Staphylococcus aureus (BCID) NOT DETECTED NOT DETECTED   Staphylococcus epidermidis NOT DETECTED NOT DETECTED   Staphylococcus lugdunensis NOT DETECTED NOT DETECTED   Streptococcus species NOT DETECTED NOT DETECTED   Streptococcus agalactiae NOT DETECTED NOT DETECTED   Streptococcus pneumoniae NOT DETECTED NOT DETECTED   Streptococcus pyogenes NOT DETECTED NOT DETECTED   A.calcoaceticus-baumannii NOT DETECTED NOT DETECTED   Bacteroides fragilis NOT DETECTED NOT DETECTED   Enterobacterales NOT DETECTED NOT DETECTED   Enterobacter cloacae complex NOT DETECTED NOT DETECTED   Escherichia coli NOT DETECTED NOT DETECTED   Klebsiella aerogenes NOT DETECTED NOT DETECTED   Klebsiella oxytoca NOT DETECTED NOT DETECTED   Klebsiella pneumoniae NOT DETECTED NOT DETECTED   Proteus species NOT DETECTED NOT DETECTED   Salmonella species NOT DETECTED NOT DETECTED   Serratia marcescens NOT DETECTED NOT  DETECTED   Haemophilus influenzae NOT DETECTED NOT DETECTED   Neisseria meningitidis NOT DETECTED NOT DETECTED   Pseudomonas aeruginosa NOT DETECTED NOT DETECTED   Stenotrophomonas maltophilia NOT DETECTED NOT DETECTED   Candida albicans NOT DETECTED NOT DETECTED   Candida auris NOT DETECTED NOT DETECTED   Candida glabrata NOT DETECTED NOT DETECTED   Candida krusei NOT DETECTED NOT DETECTED   Candida parapsilosis NOT DETECTED NOT DETECTED   Candida tropicalis NOT DETECTED NOT DETECTED   Cryptococcus neoformans/gattii NOT DETECTED NOT DETECTED    [name] [name] 04/29/2023  2:24 PM

## 2023-04-29 NOTE — Progress Notes (Addendum)
 NAME:  Curtis [name], MRN:  [id number], DOB:  01/17/1956, LOS: 4 ADMISSION DATE:  04/25/2023 History of Present Illness:  le with significant PMH of incarcerated inguinal hernia s/p repair, SBO, alcohol withdrawal seizure, EtOH abuse who presented to the ED with chief complaints of unresponsiveness.   Per ED report, EMS was dispatched to patient's residence by a neighbor who found the patient unconscious.  On EMS arrival, patient was found unresponsive on floor of bedroom. Neighbor report LKNW was 3 days ago. Patient was initially combative and would tense resist any treatment. EMS report they were unable to obtain a temperature or spo2 due to hypothermia and unable to obtain bp due to combative resistance. patient initial cbg registered at 39 treated with 1mg  glucagon repeat reading were in the upper 20's   ED Course: Initial vital signs showed HR of 55 beats/minute, BP 99/47 mm Hg, the RR 22 breaths/minute, and the oxygen saturation 100% on NRB and a temperature of 6F (25C). Patient intubated in for airway protection. Pertinent Labs/Diagnostics Findings: Na+/ K+:131/4.8  Glucose: 286 BUN/Cr.: 31/1.91 CO2 1, Anion Gap 23, AST/ALT:284/83 WBC: 15.7K/L with neutrophil predominance  Lactic acid: >9.0 COVID PCR: Negative,  troponin: 32  CK 5249 ETOH:177 VBG: pO2 46.2; pCO2 72; pH 6.96;  HCO3 16.2, %O2 Sat 46.6.  CXR> CTH> CTA Chest> CT Abd/pelvis>see results below Medication administered in the ZI:Ejupzwu given 30 cc/kg of fluids and started on broad-spectrum antibiotics with Ceftriaxone  for suspected sepsis with septic shock.   Pertinent  Medical History  -incarcerated inguinal hernia s/p repair -EtOH abuse with withdrawal seizures  Significant Hospital Events: Including procedures, antibiotic start and stop dates in addition to other pertinent events   04/24/2022 - Intubated, admit to ICU extremely hypotensive.   Interim History / Subjective:  Remains intubated sedated. Unable to  follow commands.   Objective   Blood pressure (!) 112/57, pulse 75, temperature 99 F (37.2 C), temperature source (P) Bladder, resp. rate 20, height 5' 7 (1.702 m), weight 80 kg, SpO2 96%.    Vent Mode: PRVC FiO2 (%):  [30 %-50 %] 30 % Set Rate:  [20 bmp] 20 bmp Vt Set:  [500 mL] 500 mL PEEP:  [5 cmH20] 5 cmH20 Pressure Support:  [5 cmH20] 5 cmH20 Plateau Pressure:  [17 cmH20-20 cmH20] 17 cmH20   Intake/Output Summary (Last 24 hours) at 04/29/2023 0945 Last data filed at 04/29/2023 0600 Gross per 24 hour  Intake 2378.13 ml  Output 1725 ml  Net 653.13 ml   Filed Weights   04/27/23 0345 04/28/23 0423 04/29/23 0439  Weight: 76.1 kg 76.3 kg 80 kg    Examination: General: Intubated Sedated HENT: Supple neck, reactive pupils  Lungs: Diffuse Ronchi bilaterally  Cardiovascular: Normal S1, Normal S2, RRR, no murmurs appreciated Abdomen: Soft, non tender, non distended, + BS  Extremities: Warm and well perfused. No edema.   Labs and imaging were reviewed.   Resolved Hospital Problem list   -- Hypothermia   Assessment & Plan:  Case of 68 year old male patient with a past medical history of incarcerated inguinal hernia s/p repair, SBO, alcohol withdrawal seizures, EtOH abuse who presented to [facility] on 04/24/2022 for unresponsiveness.  He was found to be profoundly hypothermic with temperature measuring 77 F.  Subsequently was intubated on 04/24/2022.  He was actively rewarmed and started on stress dose steroids.  Currently normothermic.  Course also complicated by rhabdomyolysis with CK peaking at 6000 and currently downtrending.  Finally course complicated by acute hypoxic respiratory failure  suspecting aspiration for which she is on Zosyn  and doxycycline . Tracheal aspirate w/ Moraxella Cat  Patient obstacle to extubation is mental status remains agitated with SAT.  #Toxic Metabolic Encephalopathy secondary to...  #Severe hypothermia (Now resolved) #Distributive shock - likely septic  in the setting of Pneumonia  #Gram - Cocci Bacteremia (1 bottle of 4) repeat negative - no speciation. TTE negative. #Alcohol withdrawal  #Aspiration pneumonia (Trach aspirate with Moraxella Ctarrhalis and proteus Mirabilis, gram stain w/ Gram + Cocci)  #AKI with Cr. Peak at 3.09mg /dl baseline 1.9 mg/dl.  # Rhabdomyolysis with Ck peak at 6000, downtrending now at 2000. #Thrombocytopenia   Neuro: Seroquel  50mg  BID per tube. Thiamine  100mg  IV daily. Propofol  and Fentanyl  for analgosedation.  CVS: MAP goal > 65  Pulm: On Zosyn  and Doxycycline .  GI: PPI. Tube feeds. Lax PRN.  Heme: SCDs for dvt prophylaxis  Endo: ISS. POC goals 140-180  Renal: LR 500cc bolus x1 -> LR 500cc 100cc/hr monitor uop  Best Practice (right click and Reselect all SmartList Selections daily)   Diet/type: tubefeeds DVT prophylaxis SCD Pressure ulcer(s): N/A GI prophylaxis: PPI Lines: Central line Foley:  Yes, and it is still needed Code Status:  full code Last date of multidisciplinary goals of care discussion [04/28/2022]  I spent 60 minutes caring for this patient today, including preparing to see the patient, obtaining a medical history , reviewing a separately obtained history, performing a medically appropriate examination and/or evaluation, ordering medications, tests, or procedures, documenting clinical information in the electronic health record, and independently interpreting results (not separately reported/billed) and communicating results to the patient/family/caregiver  Darrin Barn, MD [facility] 04/29/2023 11:25 AM

## 2023-04-29 NOTE — Plan of Care (Signed)
 Patient very restless and rigid despite increase in sedation at the beginning of shift. Constantly alarming the vent. Patient dislodged ETT and had to be replaced by NP Ouma. Patient with increased secretions through out shift and requiring sedation to be complaint on ventilator.   Problem: Education: Goal: Knowledge of General Education information will improve Description: Including pain rating scale, medication(s)/side effects and non-pharmacologic comfort measures 04/29/2023 0440 by Michaelyn Snowman, Ivar BRAVO, RN Outcome: Not Progressing  Problem: Health Behavior/Discharge Planning: Goal: Ability to manage health-related needs will improve 04/29/2023 0438 by Michaelyn Snowman, Ivar BRAVO, RN Outcome: Not Progressing  Problem: Clinical Measurements: Goal: Ability to maintain clinical measurements within normal limits will improve 04/29/2023 0437 by Michaelyn Snowman, Ivar BRAVO, RN Outcome: Progressing Goal: Will remain free from infection 04/29/2023 0438 by Michaelyn Snowman Ivar BRAVO, RN Outcome: Not Progressing Goal: Diagnostic test results will improve 04/29/2023 0438 by Michaelyn Snowman Ivar BRAVO, RN Outcome: Not Progressing Goal: Respiratory complications will improve 04/29/2023 0438 by Michaelyn Snowman, Ivar BRAVO, RN Outcome: Not Progressing Goal: Cardiovascular complication will be avoided 04/29/2023 0438 by Michaelyn Snowman Ivar BRAVO, RN Outcome: Not Progressing

## 2023-04-30 ENCOUNTER — Inpatient Hospital Stay: Payer: 59

## 2023-04-30 DIAGNOSIS — G9341 Metabolic encephalopathy: Secondary | ICD-10-CM | POA: Diagnosis not present

## 2023-04-30 LAB — GLUCOSE, CAPILLARY
Glucose-Capillary: 120 mg/dL — ABNORMAL HIGH (ref 70–99)
Glucose-Capillary: 122 mg/dL — ABNORMAL HIGH (ref 70–99)
Glucose-Capillary: 127 mg/dL — ABNORMAL HIGH (ref 70–99)
Glucose-Capillary: 132 mg/dL — ABNORMAL HIGH (ref 70–99)
Glucose-Capillary: 135 mg/dL — ABNORMAL HIGH (ref 70–99)
Glucose-Capillary: 141 mg/dL — ABNORMAL HIGH (ref 70–99)
Glucose-Capillary: 61 mg/dL — ABNORMAL LOW (ref 70–99)

## 2023-04-30 LAB — RENAL FUNCTION PANEL
Albumin: 2.3 g/dL — ABNORMAL LOW (ref 3.5–5.0)
Anion gap: 15 (ref 5–15)
BUN: 70 mg/dL — ABNORMAL HIGH (ref 8–23)
CO2: 24 mmol/L (ref 22–32)
Calcium: 7.2 mg/dL — ABNORMAL LOW (ref 8.9–10.3)
Chloride: 100 mmol/L (ref 98–111)
Creatinine, Ser: 2.77 mg/dL — ABNORMAL HIGH (ref 0.61–1.24)
GFR, Estimated: 24 mL/min — ABNORMAL LOW (ref >60)
Glucose, Bld: 141 mg/dL — ABNORMAL HIGH (ref 70–99)
Phosphorus: 4.2 mg/dL (ref 2.5–4.6)
Potassium: 3 mmol/L — ABNORMAL LOW (ref 3.5–5.1)
Sodium: 139 mmol/L (ref 135–145)

## 2023-04-30 LAB — CBC
HCT: 28.6 % — ABNORMAL LOW (ref 39.0–52.0)
Hemoglobin: 9.2 g/dL — ABNORMAL LOW (ref 13.0–17.0)
MCH: 33 pg (ref 26.0–34.0)
MCHC: 32.2 g/dL (ref 30.0–36.0)
MCV: 102.5 fL — ABNORMAL HIGH (ref 80.0–100.0)
Platelets: 40 10*3/uL — ABNORMAL LOW (ref 150–400)
RBC: 2.79 MIL/uL — ABNORMAL LOW (ref 4.22–5.81)
RDW: 15 % (ref 11.5–15.5)
WBC: 8.6 10*3/uL (ref 4.0–10.5)
nRBC: 0.3 % — ABNORMAL HIGH (ref 0.0–0.2)

## 2023-04-30 LAB — TRIGLYCERIDES: Triglycerides: 89 mg/dL (ref <150)

## 2023-04-30 LAB — MAGNESIUM: Magnesium: 2.4 mg/dL (ref 1.7–2.4)

## 2023-04-30 LAB — CULTURE, BLOOD (ROUTINE X 2)
Culture: NO GROWTH
Special Requests: ADEQUATE

## 2023-04-30 LAB — POTASSIUM: Potassium: 3.4 mmol/L — ABNORMAL LOW (ref 3.5–5.1)

## 2023-04-30 MED ORDER — POTASSIUM CHLORIDE 10 MEQ/100ML IV SOLN
10.0000 meq | INTRAVENOUS | Status: AC
  Administered 2023-04-30 (×2): 10 meq via INTRAVENOUS
  Filled 2023-04-30 (×2): qty 100

## 2023-04-30 MED ORDER — POTASSIUM CHLORIDE 20 MEQ PO PACK
20.0000 meq | PACK | Freq: Two times a day (BID) | ORAL | Status: DC
Start: 2023-04-30 — End: 2023-05-04
  Administered 2023-04-30 – 2023-05-04 (×9): 20 meq
  Filled 2023-04-30 (×9): qty 1

## 2023-04-30 NOTE — TOC Progression Note (Signed)
 Transition of Care [facility]) - Progression Note    Patient Details  Name: Curtis [name] MRN: [id number] Date of Birth: [birth year]  Transition of Care [facility]) CM/SW Contact  Corean ONEIDA Haddock, RN Phone Number: 04/30/2023, 8:57 AM  Clinical Narrative:     No PCP on file.  List of local PCP added to AVS Patient remains on ventilator         Expected Discharge Plan and Services                                               Social Determinants of Health (SDOH) Interventions SDOH Screenings   Tobacco Use: High Risk (04/25/2023)    Readmission Risk Interventions     No data to display

## 2023-04-30 NOTE — Plan of Care (Signed)

## 2023-04-30 NOTE — Progress Notes (Addendum)
 NAME:  Curtis [name], MRN:  [id number], DOB:  1955/06/05, LOS: 5 ADMISSION DATE:  04/25/2023 History of Present Illness:  le with significant PMH of incarcerated inguinal hernia s/p repair, SBO, alcohol withdrawal seizure, EtOH abuse who presented to the ED with chief complaints of unresponsiveness.   Per ED report, EMS was dispatched to patient's residence by a neighbor who found the patient unconscious.  On EMS arrival, patient was found unresponsive on floor of bedroom. Neighbor report LKNW was 3 days ago. Patient was initially combative and would tense resist any treatment. EMS report they were unable to obtain a temperature or spo2 due to hypothermia and unable to obtain bp due to combative resistance. patient initial cbg registered at 54 treated with 1mg  glucagon repeat reading were in the upper 20's   ED Course: Initial vital signs showed HR of 55 beats/minute, BP 99/47 mm Hg, the RR 22 breaths/minute, and the oxygen saturation 100% on NRB and a temperature of 3F (25C). Patient intubated in for airway protection. Pertinent Labs/Diagnostics Findings: Na+/ K+:131/4.8  Glucose: 286 BUN/Cr.: 31/1.91 CO2 1, Anion Gap 23, AST/ALT:284/83 WBC: 15.7K/L with neutrophil predominance  Lactic acid: >9.0 COVID PCR: Negative,  troponin: 32  CK 5249 ETOH:177 VBG: pO2 46.2; pCO2 72; pH 6.96;  HCO3 16.2, %O2 Sat 46.6.  CXR> CTH> CTA Chest> CT Abd/pelvis>see results below Medication administered in the ZI:Ejupzwu given 30 cc/kg of fluids and started on broad-spectrum antibiotics with Ceftriaxone  for suspected sepsis with septic shock.   Pertinent  Medical History  -incarcerated inguinal hernia s/p repair -EtOH abuse with withdrawal seizures  Significant Hospital Events: Including procedures, antibiotic start and stop dates in addition to other pertinent events   04/24/2022 - Intubated, admit to ICU extremely hypotensive.  04/28/2022 - Started on Librium  taper and Seroquel  BID.   Interim History  / Subjective:  Remains intubated sedated. Unable to follow commands. SAT trialed this AM but becomes agitated and does not follow commands.   Objective   Blood pressure (!) 129/55, pulse 73, temperature 98.8 F (37.1 C), resp. rate 17, height 5' 7 (1.702 m), weight 77.8 kg, SpO2 98%.    Vent Mode: PRVC FiO2 (%):  [30 %] 30 % Set Rate:  [20 bmp] 20 bmp Vt Set:  [500 mL] 500 mL PEEP:  [5 cmH20] 5 cmH20 Plateau Pressure:  [16 cmH20-18 cmH20] 16 cmH20   Intake/Output Summary (Last 24 hours) at 04/30/2023 0830 Last data filed at 04/30/2023 0658 Gross per 24 hour  Intake 3547.36 ml  Output 2300 ml  Net 1247.36 ml   Filed Weights   04/28/23 0423 04/29/23 0439 04/30/23 0500  Weight: 76.3 kg 80 kg 77.8 kg    Examination: General: Intubated Sedated HENT: Supple neck, reactive pupils  Lungs: Diffuse Ronchi bilaterally  Cardiovascular: Normal S1, Normal S2, RRR, no murmurs appreciated Abdomen: Soft, non tender, non distended, + BS  Extremities: Warm and well perfused. No edema.   Labs and imaging were reviewed.   Resolved Hospital Problem list   -- Hypothermia   Assessment & Plan:  Case of 68 year old male patient with a past medical history of incarcerated inguinal hernia s/p repair, SBO, alcohol withdrawal seizures, EtOH abuse who presented to [facility] on 04/24/2022 for unresponsiveness.  He was found to be profoundly hypothermic with temperature measuring 77 F.  Subsequently was intubated on 04/24/2022.  He was actively rewarmed and started on stress dose steroids.  Currently normothermic.  Course also complicated by rhabdomyolysis with CK peaking at 6000 and currently  downtrending.  Finally course complicated by acute hypoxic respiratory failure suspecting aspiration for which she is on Zosyn  and doxycycline . Tracheal aspirate w/ Moraxella Cat  Patient obstacle to extubation is mental status remains agitated with SAT.  #Toxic Metabolic Encephalopathy secondary to...  #Severe  hypothermia (Now resolved) #Distributive shock - likely septic in the setting of Pneumonia  #Gram - Cocci Bacteremia (1 bottle of 4) repeat negative - no speciation. TTE negative. #Alcohol withdrawal  #Aspiration pneumonia (Trach aspirate with Moraxella Ctarrhalis and proteus Mirabilis, gram stain w/ Gram + Cocci)  #AKI with Cr. Peak at 3.09mg /dl baseline 1.9 mg/dl.  # Rhabdomyolysis with Ck peak at 6000, downtrending now at 2000. #Thrombocytopenia   Neuro: Seroquel  50mg  BID per tube. Librium  taper. Thiamine  100mg  IV daily. Propofol  and Fentanyl  for analgosedation. Daily SAT.  CVS: MAP goal > 65  Pulm: On Zosyn  and Doxycycline . Daily SBT.  GI: PPI. Tube feeds. Lax PRN.  Heme: SCDs for dvt prophylaxis  Endo: ISS. POC goals 140-180  Renal: Avoid nephrotoxic agents.  Best Practice (right click and Reselect all SmartList Selections daily)   Diet/type: tubefeeds DVT prophylaxis SCD Pressure ulcer(s): N/A GI prophylaxis: PPI Lines: Central line Foley:  Yes, and it is still needed Code Status:  full code Last date of multidisciplinary goals of care discussion [04/28/2022]  I spent 45 minutes caring for this patient today, including preparing to see the patient, obtaining a medical history , reviewing a separately obtained history, performing a medically appropriate examination and/or evaluation, ordering medications, tests, or procedures, documenting clinical information in the electronic health record, and independently interpreting results (not separately reported/billed) and communicating results to the patient/family/caregiver  Darrin Barn, MD [place] [facility] 04/30/2023 8:30 AM

## 2023-04-30 NOTE — Plan of Care (Signed)
  Problem: Clinical Measurements: Goal: Ability to maintain clinical measurements within normal limits will improve Outcome: Progressing Goal: Diagnostic test results will improve Outcome: Progressing Goal: Respiratory complications will improve Outcome: Progressing Goal: Cardiovascular complication will be avoided Outcome: Progressing   Problem: Nutrition: Goal: Adequate nutrition will be maintained Outcome: Progressing   

## 2023-04-30 NOTE — Progress Notes (Signed)
 Attempted to wean sedation to determine if patient would tolerate SBT. Pt became very agitated and was biting on tube and swinging arms. Pt unable to follow commands at any time. Md notified and instructed to turn sedation back up. Weaning attempts will continue as tolerable.

## 2023-04-30 NOTE — Progress Notes (Signed)
 PHARMACY CONSULT NOTE  Pharmacy Consult for Electrolyte Monitoring and Replacement   Recent Labs: Potassium (mmol/L)  Date Value  04/30/2023 3.0 (L)  08/23/2013 3.3 (L)   Magnesium  (mg/dL)  Date Value  98/85/7974 2.4  08/23/2013 1.4 (L)   Calcium  (mg/dL)  Date Value  98/85/7974 7.2 (L)   Calcium , Total (mg/dL)  Date Value  94/89/7984 9.5   Albumin  (g/dL)  Date Value  98/85/7974 2.3 (L)   Phosphorus (mg/dL)  Date Value  98/85/7974 4.2  08/23/2013 2.2 (L)   Sodium (mmol/L)  Date Value  04/30/2023 139  08/23/2013 128 (L)   Assessment: 68 y/o male with h/o SBO secondary to strangulated inguinal hernia s/p repair 2018, etoh abuse and seizures who is admitted with AMS, aspiration pneumonia, sepsis, AKI and rhabdomyolysis. Pharmacy is asked to follow and replace electrolytes while in CCU  Nutrition: Vital AF + FWF 30 mL per tube every 4 hours  Goal of Therapy:  Electrolytes WNL  Plan:  --K 3.0: Kcl 20mEq PO BID ordered scheduled by CCNP. --Will order additional Kcl 10mEq IV x 2 --Calcium  continues to improve. Will monitor --Will re-check electrolytes with AM labs tomorrow  Idolina DELENA Percy, PharmD Clinical Pharmacist 04/30/2023 11:50 AM

## 2023-05-01 DIAGNOSIS — G9341 Metabolic encephalopathy: Secondary | ICD-10-CM | POA: Diagnosis not present

## 2023-05-01 LAB — RENAL FUNCTION PANEL
Albumin: 2.3 g/dL — ABNORMAL LOW (ref 3.5–5.0)
Anion gap: 15 (ref 5–15)
BUN: 82 mg/dL — ABNORMAL HIGH (ref 8–23)
CO2: 26 mmol/L (ref 22–32)
Calcium: 8.1 mg/dL — ABNORMAL LOW (ref 8.9–10.3)
Chloride: 103 mmol/L (ref 98–111)
Creatinine, Ser: 2.82 mg/dL — ABNORMAL HIGH (ref 0.61–1.24)
GFR, Estimated: 23 mL/min — ABNORMAL LOW (ref >60)
Glucose, Bld: 112 mg/dL — ABNORMAL HIGH (ref 70–99)
Phosphorus: 3.5 mg/dL (ref 2.5–4.6)
Potassium: 3.1 mmol/L — ABNORMAL LOW (ref 3.5–5.1)
Sodium: 144 mmol/L (ref 135–145)

## 2023-05-01 LAB — GLUCOSE, CAPILLARY
Glucose-Capillary: 104 mg/dL — ABNORMAL HIGH (ref 70–99)
Glucose-Capillary: 118 mg/dL — ABNORMAL HIGH (ref 70–99)
Glucose-Capillary: 122 mg/dL — ABNORMAL HIGH (ref 70–99)
Glucose-Capillary: 113 mg/dL — ABNORMAL HIGH (ref 70–99)
Glucose-Capillary: 132 mg/dL — ABNORMAL HIGH (ref 70–99)
Glucose-Capillary: 122 mg/dL — ABNORMAL HIGH (ref 70–99)

## 2023-05-01 LAB — POTASSIUM: Potassium: 3.9 mmol/L (ref 3.5–5.1)

## 2023-05-01 LAB — CBC
HCT: 26.6 % — ABNORMAL LOW (ref 39.0–52.0)
Hemoglobin: 8.6 g/dL — ABNORMAL LOW (ref 13.0–17.0)
MCH: 33.7 pg (ref 26.0–34.0)
MCHC: 32.3 g/dL (ref 30.0–36.0)
MCV: 104.3 fL — ABNORMAL HIGH (ref 80.0–100.0)
Platelets: 63 10*3/uL — ABNORMAL LOW (ref 150–400)
RBC: 2.55 MIL/uL — ABNORMAL LOW (ref 4.22–5.81)
RDW: 15.1 % (ref 11.5–15.5)
WBC: 6.3 10*3/uL (ref 4.0–10.5)
nRBC: 1.1 % — ABNORMAL HIGH (ref 0.0–0.2)

## 2023-05-01 LAB — MYCOPLASMA PNEUMONIAE ANTIBODY, IGM: Mycoplasma pneumo IgM: <770 U/mL (ref 0–769)

## 2023-05-01 LAB — MAGNESIUM: Magnesium: 2.2 mg/dL (ref 1.7–2.4)

## 2023-05-01 LAB — PROCALCITONIN: Procalcitonin: 2.85 ng/mL

## 2023-05-01 MED ORDER — POTASSIUM CHLORIDE 20 MEQ PO PACK
40.0000 meq | PACK | Freq: Once | ORAL | Status: AC
  Administered 2023-05-01: 40 meq
  Filled 2023-05-01: qty 2

## 2023-05-01 MED ORDER — DEXMEDETOMIDINE HCL IN NACL 400 MCG/100ML IV SOLN
0.0000 ug/kg/h | INTRAVENOUS | Status: DC
  Administered 2023-05-01: 0.5 ug/kg/h via INTRAVENOUS
  Administered 2023-05-01: 0.4 ug/kg/h via INTRAVENOUS
  Administered 2023-05-02 (×2): 0.9 ug/kg/h via INTRAVENOUS
  Administered 2023-05-02: 0.5 ug/kg/h via INTRAVENOUS
  Administered 2023-05-03 (×2): 1.2 ug/kg/h via INTRAVENOUS
  Administered 2023-05-03: 1.1 ug/kg/h via INTRAVENOUS
  Administered 2023-05-03: 1 ug/kg/h via INTRAVENOUS
  Administered 2023-05-04: 0.8 ug/kg/h via INTRAVENOUS
  Administered 2023-05-04 – 2023-05-07 (×16): 1.2 ug/kg/h via INTRAVENOUS
  Administered 2023-05-07: 1.1 ug/kg/h via INTRAVENOUS
  Filled 2023-05-01 (×29): qty 100

## 2023-05-01 MED ORDER — POTASSIUM CHLORIDE 10 MEQ/100ML IV SOLN
10.0000 meq | INTRAVENOUS | Status: AC
  Administered 2023-05-01 (×3): 10 meq via INTRAVENOUS
  Filled 2023-05-01 (×3): qty 100

## 2023-05-01 MED ORDER — SODIUM CHLORIDE 0.9 % IV SOLN
2.0000 g | Freq: Every day | INTRAVENOUS | Status: AC
  Administered 2023-05-01 – 2023-05-04 (×4): 2 g via INTRAVENOUS
  Filled 2023-05-01 (×4): qty 20

## 2023-05-01 MED ORDER — LACTATED RINGERS IV BOLUS
500.0000 mL | Freq: Once | INTRAVENOUS | Status: AC
  Administered 2023-05-01: 500 mL via INTRAVENOUS

## 2023-05-01 MED ORDER — SODIUM CHLORIDE 0.9 % IV SOLN
1.0000 g | INTRAVENOUS | Status: DC
  Filled 2023-05-01: qty 10

## 2023-05-01 MED ORDER — HEPARIN SODIUM (PORCINE) 5000 UNIT/ML IJ SOLN
5000.0000 [IU] | Freq: Three times a day (TID) | INTRAMUSCULAR | Status: DC
  Administered 2023-05-01 – 2023-05-02 (×3): 5000 [IU] via SUBCUTANEOUS
  Filled 2023-05-01 (×3): qty 1

## 2023-05-01 NOTE — Progress Notes (Addendum)
NAME:  Curtis [name], MRN:  [id number], DOB:  [birth year], LOS: 6 ADMISSION DATE:  04/25/2023 History of Present Illness:  le with significant PMH of incarcerated inguinal hernia s/p repair, SBO, alcohol withdrawal seizure, EtOH abuse who presented to the ED with chief complaints of unresponsiveness.   Per ED report, EMS was dispatched to patient's residence by a neighbor who found the patient unconscious.  On EMS arrival, patient was found unresponsive on floor of bedroom. Neighbor report LKNW was 3 days ago. Patient was initially combative and would tense resist any treatment. EMS report they were unable to obtain a temperature or spo2 due to hypothermia and unable to obtain bp due to combative resistance. patient initial cbg registered at 62 treated with 1mg  glucagon repeat reading were in the upper 20's   ED Course: Initial vital signs showed HR of 55 beats/minute, BP 99/47 mm Hg, the RR 22 breaths/minute, and the oxygen saturation 100% on NRB and a temperature of 39F (25C). Patient intubated in for airway protection. Pertinent Labs/Diagnostics Findings: Na+/ K+:131/4.8  Glucose: 286 BUN/Cr.: 31/1.91 CO2 1, Anion Gap 23, AST/ALT:284/83 WBC: 15.7K/L with neutrophil predominance  Lactic acid: >9.0 COVID PCR: Negative,  troponin: 32  CK 5249 ETOH:177 VBG: pO2 46.2; pCO2 72; pH 6.96;  HCO3 16.2, %O2 Sat 46.6.  CXR> CTH> CTA Chest> CT Abd/pelvis>see results below Medication administered in the WU:[id number] given 30 cc/kg of fluids and started on broad-spectrum antibiotics with Ceftriaxone for suspected sepsis with septic shock.   Pertinent  Medical History  -incarcerated inguinal hernia s/p repair -EtOH abuse with withdrawal seizures  Significant Hospital Events: Including procedures, antibiotic start and stop dates in addition to other pertinent events   04/24/2022 - Intubated, admit to ICU extremely hypotensive.  04/28/2022 - Started on Librium taper and Seroquel BID.  04/29/2022 -  Remains with significant agitation on SAT and unable to proceed with extubation  Interim History / Subjective:  No major events overnight.   This AM remains Intubated sedated.  Had a bowel movement yesterday. Urine output minimal.   Objective   Blood pressure (!) 139/55, pulse 92, temperature 100 F (37.8 C), temperature source Axillary, resp. rate (!) 24, height 5\' 7"  (1.702 m), weight 78.7 kg, SpO2 97%.    Vent Mode: PRVC FiO2 (%):  [30 %] 30 % Set Rate:  [20 bmp] 20 bmp Vt Set:  [500 mL] 500 mL PEEP:  [5 cmH20] 5 cmH20 Plateau Pressure:  [16 cmH20-20 cmH20] 20 cmH20   Intake/Output Summary (Last 24 hours) at 05/01/2023 0745 Last data filed at 05/01/2023 7829 Gross per 24 hour  Intake 1288.64 ml  Output 1750 ml  Net -461.36 ml   Filed Weights   04/29/23 0439 04/30/23 0500 05/01/23 0520  Weight: 80 kg 77.8 kg 78.7 kg    Examination: General: Intubated Sedated HENT: Supple neck, reactive pupils  Lungs: Imrpoved Ronchi bilaterally  Cardiovascular: Normal S1, Normal S2, RRR, no murmurs appreciated Abdomen: Soft, non tender, non distended, + BS  Extremities: Warm and well perfused. No edema.   Labs and imaging were reviewed.   Resolved Hospital Problem list   -- Hypothermia   Assessment & Plan:  Case of 68 year old male patient with a past medical history of incarcerated inguinal hernia s/p repair, SBO, alcohol withdrawal seizures, EtOH abuse who presented to [facility] on 04/24/2022 for unresponsiveness.  He was found to be profoundly hypothermic with temperature measuring 77 F.  Subsequently was intubated on 04/24/2022.  He was actively rewarmed and started on  stress dose steroids.  Currently normothermic.  Course also complicated by rhabdomyolysis with CK peaking at 6000 and currently downtrending.  Finally course complicated by acute hypoxic respiratory failure suspecting aspiration for which she is on Zosyn and doxycycline. Tracheal aspirate w/ Moraxella Cat  Patient obstacle  to extubation is mental status remains agitated with SAT.  #Toxic Metabolic Encephalopathy secondary to...  #Severe hypothermia (Now resolved) #Distributive shock - likely septic in the setting of Pneumonia  #Gram - Cocci Bacteremia (1 bottle of 4) repeat negative - no speciation. TTE negative. #Alcohol withdrawal  #Aspiration pneumonia (Trach aspirate with Moraxella Ctarrhalis and proteus Mirabilis, gram stain w/ Gram + Cocci)  #AKI with Cr. Peak at 3.09mg /dl baseline 1.9 mg/dl.  # Rhabdomyolysis with Ck peak at 6000, downtrending now at 2000. #Thrombocytopenia improving.   Neuro: Seroquel 50mg  BID per tube. Librium taper. Thiamine 100mg  IV daily. Propofol and Fentanyl for analgosedation. Daily SAT.  CVS: MAP goal > 65  Pulm: On Zosyn and completed Doxycycline. Can de-escelate to ceftriaxone. Daily SBT.  GI: PPI. Tube feeds. Lax PRN.  Heme: SCDs and heparin 5000 for dvt prophylaxis  Endo: ISS. POC goals 140-180  Renal: Avoid nephrotoxic agents. LR 500cc  Best Practice (right click and "Reselect all SmartList Selections" daily)   Diet/type: tubefeeds DVT prophylaxis SCD Pressure ulcer(s): N/A GI prophylaxis: PPI Lines: Central line Foley:  No Code Status:  full code Last date of multidisciplinary goals of care discussion [04/28/2022]  I spent 40 minutes caring for this patient today, including preparing to see the patient, obtaining a medical history , reviewing a separately obtained history, performing a medically appropriate examination and/or evaluation, ordering medications, tests, or procedures, documenting clinical information in the electronic health record, and independently interpreting results (not separately reported/billed) and communicating results to the patient/family/caregiver  Janann Colonel, MD [place] [facility] 05/01/2023 7:45 AM

## 2023-05-01 NOTE — Progress Notes (Signed)
 PHARMACY CONSULT NOTE  Pharmacy Consult for Electrolyte Monitoring and Replacement   Recent Labs: Potassium (mmol/L)  Date Value  05/01/2023 3.1 (L)  08/23/2013 3.3 (L)   Magnesium  (mg/dL)  Date Value  16/01/9603 2.2  08/23/2013 1.4 (L)   Calcium  (mg/dL)  Date Value  54/12/8117 8.1 (L)   Calcium , Total (mg/dL)  Date Value  14/78/2956 9.5   Albumin  (g/dL)  Date Value  21/30/8657 2.3 (L)   Phosphorus (mg/dL)  Date Value  84/69/6295 3.5  08/23/2013 2.2 (L)   Sodium (mmol/L)  Date Value  05/01/2023 144  08/23/2013 128 (L)   Assessment: 68 y/o male with h/o SBO secondary to strangulated inguinal hernia s/p repair 2018, etoh abuse and seizures who is admitted with AMS, aspiration pneumonia, sepsis, AKI and rhabdomyolysis. Pharmacy is asked to follow and replace electrolytes while in CCU  Nutrition: Vital AF + FWF 30 mL per tube every 4 hours  Goal of Therapy:  Electrolytes WNL  Plan:  --K 3.0: Kcl 20mEq PO BID ordered scheduled by CCNP. --Will order additional Kcl 10mEq IV x 3 --Calcium  continues to improve. Will monitor --Will re-check electrolytes with AM labs tomorrow  Cristal Don, PharmD Clinical Pharmacist 05/01/2023 7:32 AM

## 2023-05-01 NOTE — Plan of Care (Signed)
  Problem: Education: Goal: Knowledge of General Education information will improve Description: Including pain rating scale, medication(s)/side effects and non-pharmacologic comfort measures Outcome: Not Progressing   Problem: Health Behavior/Discharge Planning: Goal: Ability to manage health-related needs will improve Outcome: Not Progressing   Problem: Clinical Measurements: Goal: Ability to maintain clinical measurements within normal limits will improve Outcome: Not Progressing Goal: Will remain free from infection Outcome: Not Progressing Goal: Diagnostic test results will improve Outcome: Not Progressing Goal: Respiratory complications will improve Outcome: Progressing Goal: Cardiovascular complication will be avoided Outcome: Not Progressing   Problem: Activity: Goal: Risk for activity intolerance will decrease Outcome: Not Progressing   Problem: Nutrition: Goal: Adequate nutrition will be maintained Outcome: Not Progressing   Problem: Coping: Goal: Level of anxiety will decrease Outcome: Not Progressing   Problem: Elimination: Goal: Will not experience complications related to bowel motility Outcome: Not Progressing Goal: Will not experience complications related to urinary retention Outcome: Not Progressing   Problem: Pain Management: Goal: General experience of comfort will improve Outcome: Progressing   Problem: Safety: Goal: Ability to remain free from injury will improve Outcome: Not Progressing   Problem: Skin Integrity: Goal: Risk for impaired skin integrity will decrease Outcome: Not Progressing   Problem: Education: Goal: Ability to describe self-care measures that may prevent or decrease complications (Diabetes Survival Skills Education) will improve Outcome: Not Progressing Goal: Individualized Educational Video(s) Outcome: Not Progressing   Problem: Coping: Goal: Ability to adjust to condition or change in health will improve Outcome:  Not Progressing   Problem: Fluid Volume: Goal: Ability to maintain a balanced intake and output will improve Outcome: Progressing   Problem: Health Behavior/Discharge Planning: Goal: Ability to identify and utilize available resources and services will improve Outcome: Not Progressing Goal: Ability to manage health-related needs will improve Outcome: Not Progressing   Problem: Metabolic: Goal: Ability to maintain appropriate glucose levels will improve Outcome: Not Progressing   Problem: Nutritional: Goal: Maintenance of adequate nutrition will improve Outcome: Not Progressing Goal: Progress toward achieving an optimal weight will improve Outcome: Not Progressing   Problem: Skin Integrity: Goal: Risk for impaired skin integrity will decrease Outcome: Not Progressing   Problem: Tissue Perfusion: Goal: Adequacy of tissue perfusion will improve Outcome: Not Progressing

## 2023-05-01 NOTE — Plan of Care (Signed)
  Problem: Clinical Measurements: Goal: Diagnostic test results will improve Outcome: Progressing Goal: Respiratory complications will improve Outcome: Progressing Goal: Cardiovascular complication will be avoided Outcome: Progressing   Problem: Elimination: Goal: Will not experience complications related to bowel motility Outcome: Progressing   Problem: Pain Management: Goal: General experience of comfort will improve Outcome: Progressing

## 2023-05-02 ENCOUNTER — Inpatient Hospital Stay: Payer: 59

## 2023-05-02 DIAGNOSIS — G9341 Metabolic encephalopathy: Secondary | ICD-10-CM | POA: Diagnosis not present

## 2023-05-02 LAB — GLUCOSE, CAPILLARY
Glucose-Capillary: 127 mg/dL — ABNORMAL HIGH (ref 70–99)
Glucose-Capillary: 121 mg/dL — ABNORMAL HIGH (ref 70–99)
Glucose-Capillary: 127 mg/dL — ABNORMAL HIGH (ref 70–99)
Glucose-Capillary: 122 mg/dL — ABNORMAL HIGH (ref 70–99)
Glucose-Capillary: 116 mg/dL — ABNORMAL HIGH (ref 70–99)

## 2023-05-02 LAB — HEPATIC FUNCTION PANEL
ALT: 41 U/L (ref 0–44)
AST: 26 U/L (ref 15–41)
Albumin: 2.1 g/dL — ABNORMAL LOW (ref 3.5–5.0)
Alkaline Phosphatase: 72 U/L (ref 38–126)
Bilirubin, Direct: 0.6 mg/dL — ABNORMAL HIGH (ref 0.0–0.2)
Indirect Bilirubin: 0.5 mg/dL (ref 0.3–0.9)
Total Bilirubin: 1.1 mg/dL (ref 0.0–1.2)
Total Protein: 5.5 g/dL — ABNORMAL LOW (ref 6.5–8.1)

## 2023-05-02 LAB — BASIC METABOLIC PANEL
Anion gap: 12 (ref 5–15)
BUN: 92 mg/dL — ABNORMAL HIGH (ref 8–23)
CO2: 22 mmol/L (ref 22–32)
Calcium: 8.5 mg/dL — ABNORMAL LOW (ref 8.9–10.3)
Chloride: 109 mmol/L (ref 98–111)
Creatinine, Ser: 2.98 mg/dL — ABNORMAL HIGH (ref 0.61–1.24)
GFR, Estimated: 22 mL/min — ABNORMAL LOW (ref >60)
Glucose, Bld: 137 mg/dL — ABNORMAL HIGH (ref 70–99)
Potassium: 3.6 mmol/L (ref 3.5–5.1)
Sodium: 143 mmol/L (ref 135–145)

## 2023-05-02 LAB — CBC
HCT: 23.3 % — ABNORMAL LOW (ref 39.0–52.0)
HCT: 22 % — ABNORMAL LOW (ref 39.0–52.0)
Hemoglobin: 7.3 g/dL — ABNORMAL LOW (ref 13.0–17.0)
Hemoglobin: 6.9 g/dL — ABNORMAL LOW (ref 13.0–17.0)
MCH: 33.6 pg (ref 26.0–34.0)
MCH: 32.9 pg (ref 26.0–34.0)
MCHC: 31.3 g/dL (ref 30.0–36.0)
MCHC: 31.4 g/dL (ref 30.0–36.0)
MCV: 107.4 fL — ABNORMAL HIGH (ref 80.0–100.0)
MCV: 104.8 fL — ABNORMAL HIGH (ref 80.0–100.0)
Platelets: 93 10*3/uL — ABNORMAL LOW (ref 150–400)
Platelets: 107 10*3/uL — ABNORMAL LOW (ref 150–400)
RBC: 2.17 MIL/uL — ABNORMAL LOW (ref 4.22–5.81)
RBC: 2.1 MIL/uL — ABNORMAL LOW (ref 4.22–5.81)
RDW: 15.3 % (ref 11.5–15.5)
RDW: 15.6 % — ABNORMAL HIGH (ref 11.5–15.5)
WBC: 7.7 10*3/uL (ref 4.0–10.5)
WBC: 9.3 10*3/uL (ref 4.0–10.5)
nRBC: 1.4 % — ABNORMAL HIGH (ref 0.0–0.2)
nRBC: 1.1 % — ABNORMAL HIGH (ref 0.0–0.2)

## 2023-05-02 LAB — CBC WITH DIFFERENTIAL/PLATELET
Abs Immature Granulocytes: 0.89 10*3/uL — ABNORMAL HIGH (ref 0.00–0.07)
Basophils Absolute: 0 10*3/uL (ref 0.0–0.1)
Basophils Relative: 0 %
Eosinophils Absolute: 0.4 10*3/uL (ref 0.0–0.5)
Eosinophils Relative: 5 %
HCT: 20.9 % — ABNORMAL LOW (ref 39.0–52.0)
Hemoglobin: 6.6 g/dL — ABNORMAL LOW (ref 13.0–17.0)
Immature Granulocytes: 10 %
Lymphocytes Relative: 15 %
Lymphs Abs: 1.3 10*3/uL (ref 0.7–4.0)
MCH: 33.5 pg (ref 26.0–34.0)
MCHC: 31.6 g/dL (ref 30.0–36.0)
MCV: 106.1 fL — ABNORMAL HIGH (ref 80.0–100.0)
Monocytes Absolute: 2.1 10*3/uL — ABNORMAL HIGH (ref 0.1–1.0)
Monocytes Relative: 23 %
Neutro Abs: 4.4 10*3/uL (ref 1.7–7.7)
Neutrophils Relative %: 47 %
Platelets: 114 10*3/uL — ABNORMAL LOW (ref 150–400)
RBC: 1.97 MIL/uL — ABNORMAL LOW (ref 4.22–5.81)
RDW: 15.5 % (ref 11.5–15.5)
Smear Review: NORMAL
WBC: 9.2 10*3/uL (ref 4.0–10.5)
nRBC: 1.1 % — ABNORMAL HIGH (ref 0.0–0.2)

## 2023-05-02 LAB — TECHNOLOGIST SMEAR REVIEW: Plt Morphology: NORMAL

## 2023-05-02 LAB — PHOSPHORUS: Phosphorus: 3.6 mg/dL (ref 2.5–4.6)

## 2023-05-02 LAB — LACTATE DEHYDROGENASE: LDH: 277 U/L — ABNORMAL HIGH (ref 98–192)

## 2023-05-02 LAB — PREPARE RBC (CROSSMATCH)

## 2023-05-02 LAB — HEMOGLOBIN AND HEMATOCRIT, BLOOD
HCT: 25 % — ABNORMAL LOW (ref 39.0–52.0)
Hemoglobin: 8 g/dL — ABNORMAL LOW (ref 13.0–17.0)

## 2023-05-02 LAB — MAGNESIUM: Magnesium: 2.2 mg/dL (ref 1.7–2.4)

## 2023-05-02 LAB — PROCALCITONIN: Procalcitonin: 1.97 ng/mL

## 2023-05-02 LAB — PATHOLOGIST SMEAR REVIEW

## 2023-05-02 MED ORDER — DOCUSATE SODIUM 50 MG/5ML PO LIQD
100.0000 mg | Freq: Two times a day (BID) | ORAL | Status: DC
  Administered 2023-05-03 – 2023-05-06 (×5): 100 mg
  Filled 2023-05-02 (×6): qty 10

## 2023-05-02 MED ORDER — FENTANYL CITRATE PF 50 MCG/ML IJ SOSY
25.0000 ug | PREFILLED_SYRINGE | Freq: Once | INTRAMUSCULAR | Status: AC
  Administered 2023-05-02: 25 ug via INTRAVENOUS
  Filled 2023-05-02: qty 1

## 2023-05-02 MED ORDER — FENTANYL CITRATE PF 50 MCG/ML IJ SOSY
PREFILLED_SYRINGE | INTRAMUSCULAR | Status: AC
  Filled 2023-05-02: qty 1

## 2023-05-02 MED ORDER — PANTOPRAZOLE SODIUM 40 MG IV SOLR
40.0000 mg | Freq: Two times a day (BID) | INTRAVENOUS | Status: DC
Start: 2023-05-02 — End: 2023-05-19
  Administered 2023-05-02 – 2023-05-18 (×34): 40 mg via INTRAVENOUS
  Filled 2023-05-02 (×35): qty 10

## 2023-05-02 MED ORDER — FENTANYL CITRATE PF 50 MCG/ML IJ SOSY
50.0000 ug | PREFILLED_SYRINGE | Freq: Once | INTRAMUSCULAR | Status: AC
Start: 2023-05-02 — End: 2023-05-02
  Administered 2023-05-02: 50 ug via INTRAVENOUS

## 2023-05-02 MED ORDER — FENTANYL CITRATE PF 50 MCG/ML IJ SOSY
25.0000 ug | PREFILLED_SYRINGE | Freq: Once | INTRAMUSCULAR | Status: AC
  Administered 2023-05-02: 25 ug via INTRAVENOUS

## 2023-05-02 MED ORDER — FENTANYL CITRATE PF 50 MCG/ML IJ SOSY
25.0000 ug | PREFILLED_SYRINGE | INTRAMUSCULAR | Status: DC | PRN
  Administered 2023-05-02 – 2023-05-03 (×5): 100 ug via INTRAVENOUS
  Filled 2023-05-02 (×5): qty 2

## 2023-05-02 MED ORDER — LACTATED RINGERS IV BOLUS
1000.0000 mL | Freq: Once | INTRAVENOUS | Status: AC
  Administered 2023-05-02: 1000 mL via INTRAVENOUS

## 2023-05-02 MED ORDER — FENTANYL CITRATE PF 50 MCG/ML IJ SOSY
25.0000 ug | PREFILLED_SYRINGE | INTRAMUSCULAR | Status: DC | PRN
Start: 2023-05-02 — End: 2023-05-03

## 2023-05-02 MED ORDER — POLYETHYLENE GLYCOL 3350 17 G PO PACK
17.0000 g | PACK | Freq: Every day | ORAL | Status: DC
  Administered 2023-05-03 – 2023-05-06 (×4): 17 g
  Filled 2023-05-02 (×4): qty 1

## 2023-05-02 MED ORDER — SODIUM CHLORIDE 0.9% IV SOLUTION: Freq: Once | INTRAVENOUS | Status: DC

## 2023-05-02 NOTE — Plan of Care (Signed)
  Problem: Clinical Measurements: Goal: Ability to maintain clinical measurements within normal limits will improve Outcome: Progressing Goal: Will remain free from infection Outcome: Progressing Goal: Diagnostic test results will improve Outcome: Progressing Goal: Respiratory complications will improve Outcome: Progressing Goal: Cardiovascular complication will be avoided Outcome: Progressing   Problem: Activity: Goal: Risk for activity intolerance will decrease Outcome: Progressing   Problem: Nutrition: Goal: Adequate nutrition will be maintained Outcome: Progressing   Problem: Coping: Goal: Level of anxiety will decrease Outcome: Progressing   Problem: Elimination: Goal: Will not experience complications related to bowel motility Outcome: Progressing Goal: Will not experience complications related to urinary retention Outcome: Progressing   Problem: Pain Management: Goal: General experience of comfort will improve Outcome: Progressing   Problem: Safety: Goal: Ability to remain free from injury will improve Outcome: Progressing   Problem: Skin Integrity: Goal: Risk for impaired skin integrity will decrease Outcome: Progressing   Problem: Coping: Goal: Ability to adjust to condition or change in health will improve Outcome: Progressing   Problem: Fluid Volume: Goal: Ability to maintain a balanced intake and output will improve Outcome: Progressing   Problem: Metabolic: Goal: Ability to maintain appropriate glucose levels will improve Outcome: Progressing   Problem: Nutritional: Goal: Maintenance of adequate nutrition will improve Outcome: Progressing Goal: Progress toward achieving an optimal weight will improve Outcome: Progressing   Problem: Skin Integrity: Goal: Risk for impaired skin integrity will decrease Outcome: Progressing   Problem: Tissue Perfusion: Goal: Adequacy of tissue perfusion will improve Outcome: Progressing   Problem:  Education: Goal: Knowledge of General Education information will improve Description: Including pain rating scale, medication(s)/side effects and non-pharmacologic comfort measures Outcome: Not Progressing   Problem: Health Behavior/Discharge Planning: Goal: Ability to manage health-related needs will improve Outcome: Not Progressing   Problem: Education: Goal: Ability to describe self-care measures that may prevent or decrease complications (Diabetes Survival Skills Education) will improve Outcome: Not Progressing Goal: Individualized Educational Video(s) Outcome: Not Progressing   Problem: Health Behavior/Discharge Planning: Goal: Ability to identify and utilize available resources and services will improve Outcome: Not Progressing Goal: Ability to manage health-related needs will improve Outcome: Not Progressing

## 2023-05-02 NOTE — Progress Notes (Signed)
Nutrition Follow Up Note   DOCUMENTATION CODES:   Not applicable  INTERVENTION:   Resume Vital 1.2@60ml /hr continuous   Free water flushes 30ml q4 hours to maintain tube patency   Regimen provides 1728kcal/day, 108g/day protein and 1320ml/day of free water.   Continue MVI, folic acid and thiamine   Daily weights   Check iron, TIBC, folate, ferritin and B12 labs  NUTRITION DIAGNOSIS:   Inadequate oral intake related to inability to eat (pt sedated and ventilated) as evidenced by NPO status.  GOAL:   Provide needs based on ASPEN/SCCM guidelines -met with tube feeds   MONITOR:   Vent status, Labs, Weight trends, TF tolerance, I & O's, Skin  ASSESSMENT:   68 y/o male with h/o SBO secondary to strangulated inguinal hernia s/p repair 2018, etoh abuse and seizures who is admitted with AMS, aspiration pneumonia, sepsis, AKI and  rhabdomyolysis.  Pt remains sedated and ventilated. OGT in place. Tube feeds held yesterday for SBTs and were not restarted overnight r/t possible GIB. Pt with bowel function today; fecal output does not appear bloody. Abdominal CT reporting large right inguinal hernia containing loops of small bowel but no bowel obstruction. Hemoglobin decreasing; will check iron/anemia labs. Will plan to resume tube feeds today. Refeed labs stable. Per chart, pt is up ~17lbs since admission but appears to be back at his UBW although last documented weight in chart was from 2023. Pt +13.7L on his I & Os.   Medications reviewed and include: folic acid, insulin, MVI, protonix, KCl,  thiamine, ceftriaxone  Labs reviewed: K 3.6 wnl, BUN 92(H), creat 2.98(H), P 3.6 wnl, Mg 2.2 wnl Hgb 6.9(L), Hct 22.0(L) Cbgs- 127, 121, 127 x 24 hrs   Patient is currently intubated on ventilator support MV: 10.0 L/min Temp (24hrs), Avg:98.6 F (37 C), Min:98.2 F (36.8 C), Max:98.9 F (37.2 C)  MAP- >   UOP-   Diet Order:   Diet Order             Diet NPO time  specified  Diet effective now                  EDUCATION NEEDS:   No education needs have been identified at this time  Skin:  Skin Assessment: Reviewed RN Assessment  Last BM:  1/15- type 7- via rectal tube  Height:   Ht Readings from Last 1 Encounters:  04/25/23 5\' 7"  (1.702 m)    Weight:   Wt Readings from Last 1 Encounters:  05/02/23 79.8 kg    Ideal Body Weight:  67.2 kg  BMI:  Body mass index is 27.55 kg/m.  Estimated Nutritional Needs:   Kcal:  1742kcal/day  Protein:  110-120g/day  Fluid:  1.7-2.0L/day  Betsey Holiday MS, RD, LDN If unable to be reached, please send secure chat to "RD inpatient" available from 8:00a-4:00p daily

## 2023-05-02 NOTE — Progress Notes (Signed)
PHARMACY CONSULT NOTE  Pharmacy Consult for Electrolyte Monitoring and Replacement   Recent Labs: Potassium (mmol/L)  Date Value  05/02/2023 3.6  08/23/2013 3.3 (L)   Magnesium (mg/dL)  Date Value  16/01/9603 2.2  08/23/2013 1.4 (L)   Calcium (mg/dL)  Date Value  54/12/8117 8.5 (L)   Calcium, Total (mg/dL)  Date Value  14/78/2956 9.5   Albumin (g/dL)  Date Value  21/30/8657 2.3 (L)   Phosphorus (mg/dL)  Date Value  84/69/6295 3.6  08/23/2013 2.2 (L)   Sodium (mmol/L)  Date Value  05/02/2023 143  08/23/2013 128 (L)   Assessment: 68 y/o male with h/o SBO secondary to strangulated inguinal hernia s/p repair 2018, etoh abuse and seizures who is admitted with AMS, aspiration pneumonia, sepsis, AKI and rhabdomyolysis. Pharmacy is asked to follow and replace electrolytes while in CCU  Nutrition: Vital AF + FWF 30 mL per tube every 4 hours  Goal of Therapy:  Electrolytes WNL  Plan:  --Kcl PO BID ordered scheduled by CCNP. --No additional replacement warranted today --Calcium continues to improve. Will monitor --Will re-check electrolytes with AM labs tomorrow  Bettey Costa, PharmD Clinical Pharmacist 05/02/2023 7:41 AM

## 2023-05-02 NOTE — Progress Notes (Addendum)
NAME:  Curtis [name], MRN:  [id number], DOB:  08/05/1955, LOS: 7 ADMISSION DATE:  04/25/2023 History of Present Illness:  le with significant PMH of incarcerated inguinal hernia s/p repair, SBO, alcohol withdrawal seizure, EtOH abuse who presented to the ED with chief complaints of unresponsiveness.   Per ED report, EMS was dispatched to patient's residence by a neighbor who found the patient unconscious.  On EMS arrival, patient was found unresponsive on floor of bedroom. Neighbor report LKNW was 3 days ago. Patient was initially combative and would tense resist any treatment. EMS report they were unable to obtain a temperature or spo2 due to hypothermia and unable to obtain bp due to combative resistance. patient initial cbg registered at 27 treated with 1mg  glucagon repeat reading were in the upper 20's   ED Course: Initial vital signs showed HR of 55 beats/minute, BP 99/47 mm Hg, the RR 22 breaths/minute, and the oxygen saturation 100% on NRB and a temperature of 23F (25C). Patient intubated in for airway protection. Pertinent Labs/Diagnostics Findings: Na+/ K+:131/4.8  Glucose: 286 BUN/Cr.: 31/1.91 CO2 1, Anion Gap 23, AST/ALT:284/83 WBC: 15.7K/L with neutrophil predominance  Lactic acid: >9.0 COVID PCR: Negative,  troponin: 32  CK 5249 ETOH:177 VBG: pO2 46.2; pCO2 72; pH 6.96;  HCO3 16.2, %O2 Sat 46.6.  CXR> CTH> CTA Chest> CT Abd/pelvis>see results below Medication administered in the RK:YHCWCBJ given 30 cc/kg of fluids and started on broad-spectrum antibiotics with Ceftriaxone for suspected sepsis with septic shock.   Pertinent  Medical History  -incarcerated inguinal hernia s/p repair -EtOH abuse with withdrawal seizures  Significant Hospital Events: Including procedures, antibiotic start and stop dates in addition to other pertinent events   04/24/2022 - Intubated, admit to ICU extremely hypotensive.  04/28/2022 - Started on Librium taper and Seroquel BID.  04/29/2022 -  Remains with significant agitation on SAT and unable to proceed with extubation.  04/30/2022 Mental status with significant improvement. Following commands. Hgb trending down now at 7.3g/dl. Stools brown. NG tube to suction and no signs of bleeding.   Interim History / Subjective:  No major events overnight.   This AM remains Intubated minimally sedated. Following commands.   Had a bowel movement yesterday . Urine output minimal.   Dropped Hgb this AM to 7.3g/dl. HD stable. Stools brown with no signs of bleeding. NG tube to suction with also no signs of bleeding.  Objective   Blood pressure (!) 116/48, pulse 73, temperature 98.9 F (37.2 C), temperature source Axillary, resp. rate (!) 26, height 5\' 7"  (1.702 m), weight 79.8 kg, SpO2 93%.    Vent Mode: Spontaneous FiO2 (%):  [30 %] 30 % Set Rate:  [20 bmp] 20 bmp Vt Set:  [500 mL] 500 mL PEEP:  [5 cmH20] 5 cmH20 Pressure Support:  [5 cmH20] 5 cmH20 Plateau Pressure:  [11 cmH20-20 cmH20] 11 cmH20   Intake/Output Summary (Last 24 hours) at 05/02/2023 0818 Last data filed at 05/02/2023 0700 Gross per 24 hour  Intake 3498.37 ml  Output 1780 ml  Net 1718.37 ml   Filed Weights   04/30/23 0500 05/01/23 0520 05/02/23 0400  Weight: 77.8 kg 78.7 kg 79.8 kg    Examination: General: Intubated minimally sedated. Following commands.  HENT: Supple neck, reactive pupils  Lungs: Imrpoved Ronchi bilaterally  Cardiovascular: Normal S1, Normal S2, RRR, no murmurs appreciated Abdomen: Soft, non tender, non distended, + BS  Extremities: Warm and well perfused. No edema.   Labs and imaging were reviewed.   [facility]  Problem list   -- Hypothermia   Assessment & Plan:  Case of 68 year old male patient with a past medical history of incarcerated inguinal hernia s/p repair, SBO, alcohol withdrawal seizures, EtOH abuse who presented to [facility] on 04/24/2022 for unresponsiveness.  He was found to be profoundly hypothermic with temperature  measuring 77 F.  Subsequently was intubated on 04/24/2022.  He was actively rewarmed and started on stress dose steroids.  Currently normothermic.  Course also complicated by rhabdomyolysis with CK peaking at 6000 and currently downtrending.  Finally course complicated by acute hypoxic respiratory failure suspecting aspiration for which she is on Zosyn and doxycycline. Tracheal aspirate w/ Moraxella Cat  Patient obstacle to extubation is mental status remains agitated with SAT.  #Toxic Metabolic Encephalopathy imroving secondary to...  #Severe hypothermia (Now resolved) #Distributive shock - likely septic in the setting of Pneumonia  #Gram - Cocci Bacteremia (1 bottle of 4) repeat negative - no speciation. TTE negative. #Alcohol withdrawal  #Aspiration pneumonia (Trach aspirate with Moraxella Ctarrhalis and proteus Mirabilis, gram stain w/ Gram + Cocci)  #AKI with Cr. Peak at 3.09mg /dl baseline 1.9 mg/dl.  # Rhabdomyolysis with Ck peak at 6000, downtrending now at 2000. #Thrombocytopenia improving.  #Anemia with Hgb downtrending now at 7.3g/dl no signs of overt bleeding. Brown stools, NG output non bloody. HD stable.   Neuro: Librium taper. Thiamine 100mg  IV daily. Propofol and Fentanyl for analgosedation. Daily SAT.  CVS: MAP goal > 65  Pulm: De-escalated to Ceftriaxone Daily SBT.  GI: PPI. Tube feeds. Lax PRN.  Heme: SCDs and hold heparin 5000 for dvt prophylaxis. Repeat CBC at 12:00. If hgb continues to downtrend --> Transfuse, Ct a/p, Hemolysis lab and possibly consult GI.  Endo: ISS. POC goals 140-180  Renal: Avoid nephrotoxic agents.   Best Practice (right click and "Reselect all SmartList Selections" daily)   Diet/type: tubefeeds DVT prophylaxis SCD Pressure ulcer(s): N/A GI prophylaxis: PPI Lines: Central line Foley:  No Code Status:  full code Last date of multidisciplinary goals of care discussion [04/28/2022]  I spent 50 minutes caring for this patient today, including  preparing to see the patient, obtaining a medical history , reviewing a separately obtained history, performing a medically appropriate examination and/or evaluation, ordering medications, tests, or procedures, documenting clinical information in the electronic health record, and independently interpreting results (not separately reported/billed) and communicating results to the patient/family/caregiver  Janann Colonel, MD [place] [facility] 05/02/2023 8:18 AM

## 2023-05-03 ENCOUNTER — Inpatient Hospital Stay: Payer: 59

## 2023-05-03 DIAGNOSIS — G9341 Metabolic encephalopathy: Secondary | ICD-10-CM | POA: Diagnosis not present

## 2023-05-03 LAB — GLUCOSE, CAPILLARY
Glucose-Capillary: 101 mg/dL — ABNORMAL HIGH (ref 70–99)
Glucose-Capillary: 104 mg/dL — ABNORMAL HIGH (ref 70–99)
Glucose-Capillary: 110 mg/dL — ABNORMAL HIGH (ref 70–99)
Glucose-Capillary: 114 mg/dL — ABNORMAL HIGH (ref 70–99)
Glucose-Capillary: 118 mg/dL — ABNORMAL HIGH (ref 70–99)
Glucose-Capillary: 123 mg/dL — ABNORMAL HIGH (ref 70–99)
Glucose-Capillary: 125 mg/dL — ABNORMAL HIGH (ref 70–99)

## 2023-05-03 LAB — IRON AND TIBC
Iron: 65 ug/dL (ref 45–182)
Saturation Ratios: 25 % (ref 17.9–39.5)
TIBC: 259 ug/dL (ref 250–450)
UIBC: 194 ug/dL

## 2023-05-03 LAB — BASIC METABOLIC PANEL
Anion gap: 13 (ref 5–15)
BUN: 92 mg/dL — ABNORMAL HIGH (ref 8–23)
CO2: 21 mmol/L — ABNORMAL LOW (ref 22–32)
Calcium: 9 mg/dL (ref 8.9–10.3)
Chloride: 113 mmol/L — ABNORMAL HIGH (ref 98–111)
Creatinine, Ser: 2.9 mg/dL — ABNORMAL HIGH (ref 0.61–1.24)
GFR, Estimated: 23 mL/min — ABNORMAL LOW (ref >60)
Glucose, Bld: 122 mg/dL — ABNORMAL HIGH (ref 70–99)
Potassium: 4.1 mmol/L (ref 3.5–5.1)
Sodium: 147 mmol/L — ABNORMAL HIGH (ref 135–145)

## 2023-05-03 LAB — CBC
HCT: 27.4 % — ABNORMAL LOW (ref 39.0–52.0)
Hemoglobin: 8.7 g/dL — ABNORMAL LOW (ref 13.0–17.0)
MCH: 32.7 pg (ref 26.0–34.0)
MCHC: 31.8 g/dL (ref 30.0–36.0)
MCV: 103 fL — ABNORMAL HIGH (ref 80.0–100.0)
Platelets: 141 10*3/uL — ABNORMAL LOW (ref 150–400)
RBC: 2.66 MIL/uL — ABNORMAL LOW (ref 4.22–5.81)
RDW: 17.5 % — ABNORMAL HIGH (ref 11.5–15.5)
WBC: 12 10*3/uL — ABNORMAL HIGH (ref 4.0–10.5)
nRBC: 0.5 % — ABNORMAL HIGH (ref 0.0–0.2)

## 2023-05-03 LAB — MAGNESIUM: Magnesium: 2.3 mg/dL (ref 1.7–2.4)

## 2023-05-03 LAB — PROCALCITONIN: Procalcitonin: 1.17 ng/mL

## 2023-05-03 LAB — VITAMIN B12: Vitamin B-12: 1338 pg/mL — ABNORMAL HIGH (ref 180–914)

## 2023-05-03 LAB — FOLATE: Folate: 16.9 ng/mL (ref >5.9)

## 2023-05-03 LAB — FERRITIN: Ferritin: 888 ng/mL — ABNORMAL HIGH (ref 24–336)

## 2023-05-03 LAB — PHOSPHORUS: Phosphorus: 3.3 mg/dL (ref 2.5–4.6)

## 2023-05-03 LAB — HAPTOGLOBIN: Haptoglobin: 111 mg/dL (ref 32–363)

## 2023-05-03 MED ORDER — FENTANYL CITRATE (PF) 100 MCG/2ML IJ SOLN
INTRAMUSCULAR | Status: AC
  Administered 2023-05-03: 100 ug via INTRAVENOUS
  Filled 2023-05-03: qty 2

## 2023-05-03 MED ORDER — FENTANYL BOLUS VIA INFUSION
25.0000 ug | INTRAVENOUS | Status: DC | PRN
Start: 2023-05-03 — End: 2023-05-07
  Administered 2023-05-03 (×2): 25 ug via INTRAVENOUS
  Administered 2023-05-04 – 2023-05-05 (×4): 50 ug via INTRAVENOUS
  Administered 2023-05-05: 75 ug via INTRAVENOUS
  Administered 2023-05-06: 50 ug via INTRAVENOUS
  Administered 2023-05-06 (×2): 100 ug via INTRAVENOUS
  Administered 2023-05-06: 50 ug via INTRAVENOUS
  Administered 2023-05-06: 100 ug via INTRAVENOUS
  Administered 2023-05-07 (×2): 50 ug via INTRAVENOUS

## 2023-05-03 MED ORDER — FENTANYL 2500MCG IN NS 250ML (10MCG/ML) PREMIX INFUSION
25.0000 ug/h | INTRAVENOUS | Status: DC
Start: 2023-05-03 — End: 2023-05-07
  Administered 2023-05-03: 25 ug/h via INTRAVENOUS
  Administered 2023-05-05: 75 ug/h via INTRAVENOUS
  Administered 2023-05-06: 150 ug/h via INTRAVENOUS
  Administered 2023-05-06: 125 ug/h via INTRAVENOUS
  Filled 2023-05-03 (×4): qty 250

## 2023-05-03 MED ORDER — MIDAZOLAM HCL 2 MG/2ML IJ SOLN
1.0000 mg | INTRAMUSCULAR | Status: DC | PRN
  Administered 2023-05-03 – 2023-05-07 (×11): 2 mg via INTRAVENOUS
  Filled 2023-05-03 (×10): qty 2

## 2023-05-03 MED ORDER — FENTANYL CITRATE PF 50 MCG/ML IJ SOSY
50.0000 ug | PREFILLED_SYRINGE | INTRAMUSCULAR | Status: DC | PRN
  Administered 2023-05-03 (×2): 50 ug via INTRAVENOUS
  Filled 2023-05-03: qty 1

## 2023-05-03 MED ORDER — MIDAZOLAM HCL 2 MG/2ML IJ SOLN
2.0000 mg | Freq: Once | INTRAMUSCULAR | Status: AC
  Filled 2023-05-03: qty 2

## 2023-05-03 MED ORDER — MIDAZOLAM HCL 2 MG/2ML IJ SOLN
INTRAMUSCULAR | Status: AC
  Administered 2023-05-03: 2 mg via INTRAVENOUS
  Filled 2023-05-03: qty 2

## 2023-05-03 MED ORDER — FENTANYL CITRATE (PF) 100 MCG/2ML IJ SOLN
100.0000 ug | Freq: Once | INTRAMUSCULAR | Status: AC
  Filled 2023-05-03: qty 2

## 2023-05-03 MED ORDER — QUETIAPINE FUMARATE 25 MG PO TABS
25.0000 mg | ORAL_TABLET | Freq: Two times a day (BID) | ORAL | Status: DC
  Administered 2023-05-03 – 2023-05-05 (×6): 25 mg
  Filled 2023-05-03 (×6): qty 1

## 2023-05-03 NOTE — Progress Notes (Addendum)
PHARMACY CONSULT NOTE  Pharmacy Consult for Electrolyte Monitoring and Replacement   Recent Labs: Potassium (mmol/L)  Date Value  05/03/2023 4.1  08/23/2013 3.3 (L)   Magnesium (mg/dL)  Date Value  16/01/9603 2.3  08/23/2013 1.4 (L)   Calcium (mg/dL)  Date Value  54/12/8117 9.0   Calcium, Total (mg/dL)  Date Value  14/78/2956 9.5   Albumin (g/dL)  Date Value  21/30/8657 2.1 (L)   Phosphorus (mg/dL)  Date Value  84/69/6295 3.3  08/23/2013 2.2 (L)   Sodium (mmol/L)  Date Value  05/03/2023 147 (H)  08/23/2013 128 (L)   Assessment: 68 y/o male with h/o SBO secondary to strangulated inguinal hernia s/p repair 2018, etoh abuse and seizures who is admitted with AMS, aspiration pneumonia, sepsis, AKI and rhabdomyolysis. Pharmacy is asked to follow and replace electrolytes while in CCU  Nutrition: Vital AF + FWF 30 mL per tube every 4 hours Medications: Kcl BID  Goal of Therapy:  Electrolytes WNL K>4  Plan:  --No replacement currently indicated --continue with Kcl via tube twice daily --Will re-check electrolytes with AM labs tomorrow  Bettey Costa, PharmD Clinical Pharmacist 05/03/2023 7:16 AM

## 2023-05-03 NOTE — Progress Notes (Signed)
NAME:  Curtis [name], MRN:  [id number], DOB:  10-Oct-1955, LOS: 8 ADMISSION DATE:  04/25/2023 History of Present Illness:  le with significant PMH of incarcerated inguinal hernia s/p repair, SBO, alcohol withdrawal seizure, EtOH abuse who presented to the ED with chief complaints of unresponsiveness.   Per ED report, EMS was dispatched to patient's residence by a neighbor who found the patient unconscious.  On EMS arrival, patient was found unresponsive on floor of bedroom. Neighbor report LKNW was 3 days ago. Patient was initially combative and would tense resist any treatment. EMS report they were unable to obtain a temperature or spo2 due to hypothermia and unable to obtain bp due to combative resistance. patient initial cbg registered at 6 treated with 1mg  glucagon repeat reading were in the upper 20's   ED Course: Initial vital signs showed HR of 55 beats/minute, BP 99/47 mm Hg, the RR 22 breaths/minute, and the oxygen saturation 100% on NRB and a temperature of 10F (25C). Patient intubated in for airway protection. Pertinent Labs/Diagnostics Findings: Na+/ K+:131/4.8  Glucose: 286 BUN/Cr.: 31/1.91 CO2 1, Anion Gap 23, AST/ALT:284/83 WBC: 15.7K/L with neutrophil predominance  Lactic acid: >9.0 COVID PCR: Negative,  troponin: 32  CK 5249 ETOH:177 VBG: pO2 46.2; pCO2 72; pH 6.96;  HCO3 16.2, %O2 Sat 46.6.  CXR> CTH> CTA Chest> CT Abd/pelvis>see results below Medication administered in the NF:AOZHYQM given 30 cc/kg of fluids and started on broad-spectrum antibiotics with Ceftriaxone for suspected sepsis with septic shock.   Pertinent  Medical History  -incarcerated inguinal hernia s/p repair -EtOH abuse with withdrawal seizures  Significant Hospital Events: Including procedures, antibiotic start and stop dates in addition to other pertinent events   04/24/2022 - Intubated, admit to ICU extremely hypotensive.  04/28/2022 - Started on Librium taper and Seroquel BID.  04/29/2022 -  Remains with significant agitation on SAT and unable to proceed with extubation.  04/30/2022 Mental status with significant improvement. Following commands. Hgb trending down now at 7.3g/dl. Stools brown. NG tube to suction and no signs of bleeding.  05/01/2022 1pRBC given. Improved mental status.   Interim History / Subjective:  No major events overnight.   This AM remains Intubated minimally sedated. Following commands.   H&H stable at 8.7 mg/dl. HD stable.   Objective   Blood pressure (!) 145/58, pulse 65, temperature 98.7 F (37.1 C), temperature source Axillary, resp. rate (!) 22, height 5' 7.01" (1.702 m), weight 79.5 kg, SpO2 96%.    Vent Mode: PRVC FiO2 (%):  [30 %] 30 % Set Rate:  [20 bmp] 20 bmp Vt Set:  [500 mL] 500 mL PEEP:  [5 cmH20] 5 cmH20 Pressure Support:  [5 cmH20] 5 cmH20 Plateau Pressure:  [10 cmH20-15 cmH20] 14 cmH20   Intake/Output Summary (Last 24 hours) at 05/03/2023 0821 Last data filed at 05/03/2023 0600 Gross per 24 hour  Intake 1476.87 ml  Output 1638 ml  Net -161.13 ml   Filed Weights   05/01/23 0520 05/02/23 0400 05/03/23 0400  Weight: 78.7 kg 79.8 kg 79.5 kg    Examination: General: Intubated minimally sedated. Following commands.  HENT: Supple neck, reactive pupils  Lungs: Imrpoved Ronchi bilaterally  Cardiovascular: Normal S1, Normal S2, RRR, no murmurs appreciated Abdomen: Soft, non tender, non distended, + BS  Extremities: Warm and well perfused. No edema.   Labs and imaging were reviewed.   Resolved Hospital Problem list   -- Hypothermia   Assessment & Plan:  Case of 68 year old male patient with a past medical  history of incarcerated inguinal hernia s/p repair, SBO, alcohol withdrawal seizures, EtOH abuse who presented to [facility] on 04/24/2022 for unresponsiveness.  He was found to be profoundly hypothermic with temperature measuring 77 F.  Subsequently was intubated on 04/24/2022.  He was actively rewarmed and started on stress dose  steroids.  Currently normothermic.  Course also complicated by rhabdomyolysis with CK peaking at 6000 and currently downtrending.  Finally course complicated by acute hypoxic respiratory failure suspecting aspiration for which she is on Zosyn and doxycycline. Tracheal aspirate w/ Moraxella Cat  Patient obstacle to extubation is mental status remains agitated with SAT.  #Toxic Metabolic Encephalopathy imroving secondary to...  #Severe hypothermia (Now resolved) #Distributive shock - likely septic in the setting of Pneumonia  #Gram - Cocci Bacteremia (1 bottle of 4) repeat negative - no speciation. TTE negative. #Alcohol withdrawal  #Aspiration pneumonia (Trach aspirate with Moraxella Ctarrhalis and proteus Mirabilis, gram stain w/ Gram + Cocci)  #AKI with Cr. Peak at 5.9mg /dl baseline 1.9 mg/dl.  # Rhabdomyolysis with Ck peak at 6000, downtrending now at 2000. #Thrombocytopenia improving.  #Anemia with Hgb downtrending now at 7.3g/dl no signs of overt bleeding. Brown stools, NG output non bloody. HD stable.   Neuro: Librium taper. Thiamine 100mg  IV daily. Propofol and Fentanyl for analgosedation. Daily SAT.  CVS: MAP goal > 65  Pulm: De-escalated to Ceftriaxone Daily SBT.  GI: PPI. Tube feeds. Lax PRN.  Heme: SCDs and keep holding heparin 5000 for dvt prophylaxis.H&H in the am Endo: ISS. POC goals 140-180  Renal: Avoid nephrotoxic agents.   Best Practice (right click and "Reselect all SmartList Selections" daily)   Diet/type: tubefeeds DVT prophylaxis SCD Pressure ulcer(s): N/A GI prophylaxis: PPI Lines: Central line Foley:  No Code Status:  full code Last date of multidisciplinary goals of care discussion [04/28/2022]  I spent 50 minutes caring for this patient today, including preparing to see the patient, obtaining a medical history , reviewing a separately obtained history, performing a medically appropriate examination and/or evaluation, ordering medications, tests, or procedures,  documenting clinical information in the electronic health record, and independently interpreting results (not separately reported/billed) and communicating results to the patient/family/caregiver  Janann Colonel, MD [place] [facility] 05/03/2023 8:21 AM

## 2023-05-03 NOTE — Plan of Care (Signed)
Continuing with plan of care. 

## 2023-05-03 NOTE — Progress Notes (Signed)
Patient transferred to a from MRI with this nurse present, respiratory therapy, and transport.  Patient remained on cardiac monitoring and ventilator throughout the trip.  Upon return to room, patient became extremely agitated and restless despite one time dose of fentanyl and versed previously given.  Requested for Dr. Larinda Buttery to come to bedside at approximately 2:14pm; NP came to bedside assessed patient and called Dr. Larinda Buttery to bedside.  At approximately 2:34pm Dr. Larinda Buttery arrived to bedside and evaluated patient.  Dr. Larinda Buttery determined for patient to remain intubated and utilize prn Fentayl and Versed for restlessness and agitation.

## 2023-05-03 NOTE — Plan of Care (Signed)
  Problem: Clinical Measurements: Goal: Ability to maintain clinical measurements within normal limits will improve Outcome: Progressing Goal: Will remain free from infection Outcome: Progressing Goal: Diagnostic test results will improve Outcome: Progressing Goal: Respiratory complications will improve Outcome: Progressing Goal: Cardiovascular complication will be avoided Outcome: Progressing   Problem: Activity: Goal: Risk for activity intolerance will decrease Outcome: Progressing   Problem: Nutrition: Goal: Adequate nutrition will be maintained Outcome: Progressing   Problem: Coping: Goal: Level of anxiety will decrease Outcome: Progressing   Problem: Elimination: Goal: Will not experience complications related to bowel motility Outcome: Progressing Goal: Will not experience complications related to urinary retention Outcome: Progressing   Problem: Pain Management: Goal: General experience of comfort will improve Outcome: Progressing   Problem: Safety: Goal: Ability to remain free from injury will improve Outcome: Progressing   Problem: Skin Integrity: Goal: Risk for impaired skin integrity will decrease Outcome: Progressing   Problem: Education: Goal: Ability to describe self-care measures that may prevent or decrease complications (Diabetes Survival Skills Education) will improve Outcome: Progressing Goal: Individualized Educational Video(s) Outcome: Progressing   Problem: Coping: Goal: Ability to adjust to condition or change in health will improve Outcome: Progressing   Problem: Fluid Volume: Goal: Ability to maintain a balanced intake and output will improve Outcome: Progressing   Problem: Health Behavior/Discharge Planning: Goal: Ability to identify and utilize available resources and services will improve Outcome: Progressing Goal: Ability to manage health-related needs will improve Outcome: Progressing   Problem: Metabolic: Goal: Ability to  maintain appropriate glucose levels will improve Outcome: Progressing   Problem: Nutritional: Goal: Maintenance of adequate nutrition will improve Outcome: Progressing Goal: Progress toward achieving an optimal weight will improve Outcome: Progressing   Problem: Skin Integrity: Goal: Risk for impaired skin integrity will decrease Outcome: Progressing   Problem: Tissue Perfusion: Goal: Adequacy of tissue perfusion will improve Outcome: Progressing   Problem: Education: Goal: Knowledge of General Education information will improve Description: Including pain rating scale, medication(s)/side effects and non-pharmacologic comfort measures Outcome: Not Progressing   Problem: Health Behavior/Discharge Planning: Goal: Ability to manage health-related needs will improve Outcome: Not Progressing

## 2023-05-03 NOTE — Progress Notes (Signed)
RT assisted with pt transport to MRI and back with no complications during trip but patient was very agitated upon return to room. RN at bedside with patient. Pt transported on servo-air.

## 2023-05-04 DIAGNOSIS — G9341 Metabolic encephalopathy: Secondary | ICD-10-CM | POA: Diagnosis not present

## 2023-05-04 LAB — CBC
HCT: 23.9 % — ABNORMAL LOW (ref 39.0–52.0)
HCT: 22.7 % — ABNORMAL LOW (ref 39.0–52.0)
Hemoglobin: 7.5 g/dL — ABNORMAL LOW (ref 13.0–17.0)
Hemoglobin: 7.1 g/dL — ABNORMAL LOW (ref 13.0–17.0)
MCH: 33.8 pg (ref 26.0–34.0)
MCH: 33.3 pg (ref 26.0–34.0)
MCHC: 31.4 g/dL (ref 30.0–36.0)
MCHC: 31.3 g/dL (ref 30.0–36.0)
MCV: 107.7 fL — ABNORMAL HIGH (ref 80.0–100.0)
MCV: 106.6 fL — ABNORMAL HIGH (ref 80.0–100.0)
Platelets: 195 10*3/uL (ref 150–400)
Platelets: 199 10*3/uL (ref 150–400)
RBC: 2.22 MIL/uL — ABNORMAL LOW (ref 4.22–5.81)
RBC: 2.13 MIL/uL — ABNORMAL LOW (ref 4.22–5.81)
RDW: 17.7 % — ABNORMAL HIGH (ref 11.5–15.5)
RDW: 17.8 % — ABNORMAL HIGH (ref 11.5–15.5)
WBC: 15 10*3/uL — ABNORMAL HIGH (ref 4.0–10.5)
WBC: 14.4 10*3/uL — ABNORMAL HIGH (ref 4.0–10.5)
nRBC: 0.1 % (ref 0.0–0.2)
nRBC: 0 % (ref 0.0–0.2)

## 2023-05-04 LAB — BASIC METABOLIC PANEL
Anion gap: 9 (ref 5–15)
BUN: 80 mg/dL — ABNORMAL HIGH (ref 8–23)
CO2: 23 mmol/L (ref 22–32)
Calcium: 8.9 mg/dL (ref 8.9–10.3)
Chloride: 118 mmol/L — ABNORMAL HIGH (ref 98–111)
Creatinine, Ser: 2.36 mg/dL — ABNORMAL HIGH (ref 0.61–1.24)
GFR, Estimated: 29 mL/min — ABNORMAL LOW (ref >60)
Glucose, Bld: 143 mg/dL — ABNORMAL HIGH (ref 70–99)
Potassium: 4.2 mmol/L (ref 3.5–5.1)
Sodium: 150 mmol/L — ABNORMAL HIGH (ref 135–145)

## 2023-05-04 LAB — CULTURE, BLOOD (ROUTINE X 2)
Culture: NO GROWTH
Culture: NO GROWTH
Special Requests: ADEQUATE

## 2023-05-04 LAB — BASIC METABOLIC PANEL WITH GFR
Anion gap: 8 (ref 5–15)
BUN: 90 mg/dL — ABNORMAL HIGH (ref 8–23)
CO2: 23 mmol/L (ref 22–32)
Calcium: 9.2 mg/dL (ref 8.9–10.3)
Chloride: 120 mmol/L — ABNORMAL HIGH (ref 98–111)
Creatinine, Ser: 2.53 mg/dL — ABNORMAL HIGH (ref 0.61–1.24)
GFR, Estimated: 27 mL/min — ABNORMAL LOW
Glucose, Bld: 142 mg/dL — ABNORMAL HIGH (ref 70–99)
Potassium: 4.5 mmol/L (ref 3.5–5.1)
Sodium: 151 mmol/L — ABNORMAL HIGH (ref 135–145)

## 2023-05-04 LAB — GLUCOSE, CAPILLARY
Glucose-Capillary: 120 mg/dL — ABNORMAL HIGH (ref 70–99)
Glucose-Capillary: 124 mg/dL — ABNORMAL HIGH (ref 70–99)
Glucose-Capillary: 124 mg/dL — ABNORMAL HIGH (ref 70–99)
Glucose-Capillary: 133 mg/dL — ABNORMAL HIGH (ref 70–99)
Glucose-Capillary: 145 mg/dL — ABNORMAL HIGH (ref 70–99)

## 2023-05-04 LAB — PHOSPHORUS: Phosphorus: 3.2 mg/dL (ref 2.5–4.6)

## 2023-05-04 LAB — MAGNESIUM: Magnesium: 2.4 mg/dL (ref 1.7–2.4)

## 2023-05-04 MED ORDER — ACETAMINOPHEN 10 MG/ML IV SOLN
1000.0000 mg | Freq: Four times a day (QID) | INTRAVENOUS | Status: AC
  Administered 2023-05-04 – 2023-05-05 (×4): 1000 mg via INTRAVENOUS
  Filled 2023-05-04 (×5): qty 100

## 2023-05-04 MED ORDER — FREE WATER
200.0000 mL | Status: DC
  Administered 2023-05-04 – 2023-05-05 (×6): 200 mL

## 2023-05-04 MED ORDER — LACTATED RINGERS IV BOLUS
500.0000 mL | Freq: Once | INTRAVENOUS | Status: AC
  Administered 2023-05-04: 500 mL via INTRAVENOUS

## 2023-05-04 MED ORDER — FREE WATER: 200.0000 mL | Status: DC

## 2023-05-04 MED ORDER — SODIUM CHLORIDE 0.9 % IV SOLN
2.0000 g | Freq: Once | INTRAVENOUS | Status: AC
  Administered 2023-05-05: 2 g via INTRAVENOUS
  Filled 2023-05-04: qty 20

## 2023-05-04 NOTE — Plan of Care (Signed)
  Problem: Clinical Measurements: Goal: Ability to maintain clinical measurements within normal limits will improve Outcome: Progressing Goal: Will remain free from infection Outcome: Progressing Goal: Diagnostic test results will improve Outcome: Progressing Goal: Cardiovascular complication will be avoided Outcome: Progressing   Problem: Activity: Goal: Risk for activity intolerance will decrease Outcome: Progressing   Problem: Nutrition: Goal: Adequate nutrition will be maintained Outcome: Progressing   Problem: Coping: Goal: Level of anxiety will decrease Outcome: Progressing   Problem: Elimination: Goal: Will not experience complications related to bowel motility Outcome: Progressing Goal: Will not experience complications related to urinary retention Outcome: Progressing   Problem: Pain Management: Goal: General experience of comfort will improve Outcome: Progressing   Problem: Safety: Goal: Ability to remain free from injury will improve Outcome: Progressing   Problem: Skin Integrity: Goal: Risk for impaired skin integrity will decrease Outcome: Progressing   Problem: Fluid Volume: Goal: Ability to maintain a balanced intake and output will improve Outcome: Progressing   Problem: Metabolic: Goal: Ability to maintain appropriate glucose levels will improve Outcome: Progressing   Problem: Nutritional: Goal: Maintenance of adequate nutrition will improve Outcome: Progressing Goal: Progress toward achieving an optimal weight will improve Outcome: Progressing   Problem: Skin Integrity: Goal: Risk for impaired skin integrity will decrease Outcome: Progressing   Problem: Tissue Perfusion: Goal: Adequacy of tissue perfusion will improve Outcome: Progressing   Problem: Education: Goal: Knowledge of General Education information will improve Description: Including pain rating scale, medication(s)/side effects and non-pharmacologic comfort  measures Outcome: Not Progressing   Problem: Health Behavior/Discharge Planning: Goal: Ability to manage health-related needs will improve Outcome: Not Progressing   Problem: Clinical Measurements: Goal: Respiratory complications will improve Outcome: Not Progressing   Problem: Education: Goal: Ability to describe self-care measures that may prevent or decrease complications (Diabetes Survival Skills Education) will improve Outcome: Not Progressing Goal: Individualized Educational Video(s) Outcome: Not Progressing   Problem: Coping: Goal: Ability to adjust to condition or change in health will improve Outcome: Not Progressing   Problem: Health Behavior/Discharge Planning: Goal: Ability to identify and utilize available resources and services will improve Outcome: Not Progressing Goal: Ability to manage health-related needs will improve Outcome: Not Progressing

## 2023-05-04 NOTE — Progress Notes (Signed)
PHARMACY CONSULT NOTE  Pharmacy Consult for Electrolyte Monitoring and Replacement   Recent Labs: Potassium (mmol/L)  Date Value  05/04/2023 4.5  08/23/2013 3.3 (L)   Magnesium (mg/dL)  Date Value  78/46/9629 2.4  08/23/2013 1.4 (L)   Calcium (mg/dL)  Date Value  52/84/1324 9.2   Calcium, Total (mg/dL)  Date Value  40/01/2724 9.5   Albumin (g/dL)  Date Value  36/64/4034 2.1 (L)   Phosphorus (mg/dL)  Date Value  74/25/9563 3.2  08/23/2013 2.2 (L)   Sodium (mmol/L)  Date Value  05/04/2023 151 (H)  08/23/2013 128 (L)   Assessment: 68 y/o male with h/o SBO secondary to strangulated inguinal hernia s/p repair 2018, etoh abuse and seizures who is admitted with AMS, aspiration pneumonia, sepsis, AKI and rhabdomyolysis. Pharmacy is asked to follow and replace electrolytes while in CCU  Nutrition: Vital AF + FWF 30 mL per tube every 4 hours Medications: KCl BID  Goal of Therapy:  Electrolytes WNL  Plan:  --increase free water flushes to 200 mL every 4 hours --stop po KCl --Will re-check electrolytes with AM labs tomorrow  Lowella Bandy, PharmD Clinical Pharmacist 05/04/2023 7:07 AM

## 2023-05-04 NOTE — Plan of Care (Signed)
Continuing with plan of care. 

## 2023-05-04 NOTE — Progress Notes (Addendum)
NAME:  Curtis Cancellieri, MRN:  [id number], DOB:  1955/12/30, LOS: 9 ADMISSION DATE:  04/25/2023 History of Present Illness:  le with significant PMH of incarcerated inguinal hernia s/p repair, SBO, alcohol withdrawal seizure, EtOH abuse who presented to the ED with chief complaints of unresponsiveness.   Per ED report, EMS was dispatched to patient's residence by a neighbor who found the patient unconscious.  On EMS arrival, patient was found unresponsive on floor of bedroom. Neighbor report LKNW was 3 days ago. Patient was initially combative and would tense resist any treatment. EMS report they were unable to obtain a temperature or spo2 due to hypothermia and unable to obtain bp due to combative resistance. patient initial cbg registered at 26 treated with 1mg  glucagon repeat reading were in the upper 20's   ED Course: Initial vital signs showed HR of 55 beats/minute, BP 99/47 mm Hg, the RR 22 breaths/minute, and the oxygen saturation 100% on NRB and a temperature of 36F (25C). Patient intubated in for airway protection. Pertinent Labs/Diagnostics Findings: Na+/ K+:131/4.8  Glucose: 286 BUN/Cr.: 31/1.91 CO2 1, Anion Gap 23, AST/ALT:284/83 WBC: 15.7K/L with neutrophil predominance  Lactic acid: >9.0 COVID PCR: Negative,  troponin: 32  CK 5249 ETOH:177 VBG: pO2 46.2; pCO2 72; pH 6.96;  HCO3 16.2, %O2 Sat 46.6.  CXR> CTH> CTA Chest> CT Abd/pelvis>see results below Medication administered in the EP:PIRJJOA given 30 cc/kg of fluids and started on broad-spectrum antibiotics with Ceftriaxone for suspected sepsis with septic shock.   Pertinent  Medical History  -incarcerated inguinal hernia s/p repair -EtOH abuse with withdrawal seizures  Significant Hospital Events: Including procedures, antibiotic start and stop dates in addition to other pertinent events   04/24/2022 - Intubated, admit to ICU extremely hypotensive.  04/28/2022 - Started on Librium taper and Seroquel BID.  04/29/2022 -  Remains with significant agitation on SAT and unable to proceed with extubation.  04/30/2022 Mental status with significant improvement. Following commands. Hgb trending down now at 7.3g/dl. Stools brown. NG tube to suction and no signs of bleeding.  05/01/2022 1pRBC given. Improved mental status.  05/02/2022 Patient H&H stable. Did well on SBT however mentation remains extremley poor with significant thick secretions therefore held off on extubation.   Interim History / Subjective:  No major events overnight.   This AM remains Intubated Deeply sedated. Unable to follow commnads.   H&H dropped to 7.5 mg/dl (Possibly dilutional). HD stable.   Objective   Blood pressure (!) 150/58, pulse 60, temperature 98.9 F (37.2 C), temperature source Axillary, resp. rate 18, height 5' 7.01" (1.702 m), weight 77.8 kg, SpO2 97%.    Vent Mode: PRVC FiO2 (%):  [30 %] 30 % Set Rate:  [20 bmp] 20 bmp Vt Set:  [500 mL] 500 mL PEEP:  [5 cmH20] 5 cmH20 Pressure Support:  [0 cmH20-5 cmH20] 0 cmH20 Plateau Pressure:  [15 cmH20-17 cmH20] 16 cmH20   Intake/Output Summary (Last 24 hours) at 05/04/2023 0856 Last data filed at 05/04/2023 0800 Gross per 24 hour  Intake 1472.29 ml  Output 2100 ml  Net -627.71 ml   Filed Weights   05/02/23 0400 05/03/23 0400 05/04/23 0500  Weight: 79.8 kg 79.5 kg 77.8 kg    Examination: General: Intubated deeply sedated. Following commands.  HENT: Supple neck, reactive pupils  Lungs: Imrpoved Ronchi bilaterally  Cardiovascular: Normal S1, Normal S2, RRR, no murmurs appreciated Abdomen: Soft, non tender, non distended, + BS  Extremities: Warm and well perfused. No edema.   Labs and imaging  were reviewed.   Resolved Hospital Problem list   -- Hypothermia   Assessment & Plan:  Case of 68 year old male patient with a past medical history of incarcerated inguinal hernia s/p repair, SBO, alcohol withdrawal seizures, EtOH abuse who presented to [facility] on 04/24/2022 for  unresponsiveness.  He was found to be profoundly hypothermic with temperature measuring 77 F.  Subsequently was intubated on 04/24/2022.  He was actively rewarmed and started on stress dose steroids.  Currently normothermic.  Course also complicated by rhabdomyolysis with CK peaking at 6000 and currently downtrending.  Finally course complicated by acute hypoxic respiratory failure suspecting aspiration for which she is on Zosyn and doxycycline. Tracheal aspirate w/ Moraxella Cat  Patient obstacle to extubation is mental status remains agitated with SAT.  #Toxic Metabolic Encephalopathy imroving secondary to...  #Severe hypothermia (Now resolved) #Distributive shock - likely septic in the setting of Pneumonia  #Gram - Cocci Bacteremia (1 bottle of 4) repeat negative - no speciation. TTE negative. #Alcohol withdrawal  #Aspiration pneumonia (Trach aspirate with Moraxella Ctarrhalis and proteus Mirabilis, gram stain w/ Gram + Cocci)  #AKI with Cr. Peak at 3.09mg /dl baseline 1.9 mg/dl.  # Rhabdomyolysis with Ck peak at 6000, downtrending now at 2000. #Thrombocytopenia improving.  #Anemia with Hgb downtrending now at 7.3g/dl no signs of overt bleeding. Brown stools, NG output non bloody. HD stable.   Neuro: Librium taper. Thiamine 100mg  IV daily. Precedex and Fentanyl for analgosedation. Daily SAT.  CVS: MAP goal > 65  Pulm: C/w Ceftriaxone Daily SBT.  GI: PPI. Tube feeds. Lax PRN.  Heme: SCDs and keep holding heparin 5000 for dvt prophylaxis.H&H in the am Endo: ISS. POC goals 140-180  Renal: Avoid nephrotoxic agents.   Best Practice (right click and "Reselect all SmartList Selections" daily)   Diet/type: tubefeeds DVT prophylaxis SCD Pressure ulcer(s): N/A GI prophylaxis: PPI Lines: Central line Foley:  No Code Status:  full code  Last date of multidisciplinary goals of care discussion [05/03/2022]  I spent 45 minutes caring for this patient today, including preparing to see the  patient, obtaining a medical history , reviewing a separately obtained history, performing a medically appropriate examination and/or evaluation, ordering medications, tests, or procedures, documenting clinical information in the electronic health record, and independently interpreting results (not separately reported/billed) and communicating results to the patient/family/caregiver  Janann Colonel, MD [facility] 05/04/2023 8:56 AM

## 2023-05-05 DIAGNOSIS — G9341 Metabolic encephalopathy: Secondary | ICD-10-CM | POA: Diagnosis not present

## 2023-05-05 LAB — CBC
HCT: 21.7 % — ABNORMAL LOW (ref 39.0–52.0)
Hemoglobin: 6.8 g/dL — ABNORMAL LOW (ref 13.0–17.0)
MCH: 33.2 pg (ref 26.0–34.0)
MCHC: 31.3 g/dL (ref 30.0–36.0)
MCV: 105.9 fL — ABNORMAL HIGH (ref 80.0–100.0)
Platelets: 208 10*3/uL (ref 150–400)
RBC: 2.05 MIL/uL — ABNORMAL LOW (ref 4.22–5.81)
RDW: 17.5 % — ABNORMAL HIGH (ref 11.5–15.5)
WBC: 15.2 10*3/uL — ABNORMAL HIGH (ref 4.0–10.5)
nRBC: 0 % (ref 0.0–0.2)

## 2023-05-05 LAB — BASIC METABOLIC PANEL
Anion gap: 8 (ref 5–15)
BUN: 78 mg/dL — ABNORMAL HIGH (ref 8–23)
CO2: 23 mmol/L (ref 22–32)
Calcium: 8.7 mg/dL — ABNORMAL LOW (ref 8.9–10.3)
Chloride: 120 mmol/L — ABNORMAL HIGH (ref 98–111)
Creatinine, Ser: 2.23 mg/dL — ABNORMAL HIGH (ref 0.61–1.24)
GFR, Estimated: 31 mL/min — ABNORMAL LOW (ref >60)
Glucose, Bld: 140 mg/dL — ABNORMAL HIGH (ref 70–99)
Potassium: 4.1 mmol/L (ref 3.5–5.1)
Sodium: 151 mmol/L — ABNORMAL HIGH (ref 135–145)

## 2023-05-05 LAB — GLUCOSE, CAPILLARY
Glucose-Capillary: 119 mg/dL — ABNORMAL HIGH (ref 70–99)
Glucose-Capillary: 120 mg/dL — ABNORMAL HIGH (ref 70–99)
Glucose-Capillary: 122 mg/dL — ABNORMAL HIGH (ref 70–99)
Glucose-Capillary: 128 mg/dL — ABNORMAL HIGH (ref 70–99)
Glucose-Capillary: 138 mg/dL — ABNORMAL HIGH (ref 70–99)
Glucose-Capillary: 94 mg/dL (ref 70–99)

## 2023-05-05 LAB — HEMOGLOBIN AND HEMATOCRIT, BLOOD
HCT: 22.5 % — ABNORMAL LOW (ref 39.0–52.0)
HCT: 24.4 % — ABNORMAL LOW (ref 39.0–52.0)
Hemoglobin: 7 g/dL — ABNORMAL LOW (ref 13.0–17.0)
Hemoglobin: 7.8 g/dL — ABNORMAL LOW (ref 13.0–17.0)

## 2023-05-05 LAB — PREPARE RBC (CROSSMATCH)

## 2023-05-05 MED ORDER — SODIUM CHLORIDE 0.9% IV SOLUTION: Freq: Once | INTRAVENOUS | Status: AC

## 2023-05-05 MED ORDER — FREE WATER
200.0000 mL | Status: DC
  Administered 2023-05-05 – 2023-05-06 (×13): 200 mL

## 2023-05-05 NOTE — Progress Notes (Signed)
NAME:  Curtis [name], MRN:  [id number], DOB:  1955/05/24, LOS: 10 ADMISSION DATE:  04/25/2023 History of Present Illness:  le with significant PMH of incarcerated inguinal hernia s/p repair, SBO, alcohol withdrawal seizure, EtOH abuse who presented to the ED with chief complaints of unresponsiveness.   Per ED report, EMS was dispatched to patient's residence by a neighbor who found the patient unconscious.  On EMS arrival, patient was found unresponsive on floor of bedroom. Neighbor report LKNW was 3 days ago. Patient was initially combative and would tense resist any treatment. EMS report they were unable to obtain a temperature or spo2 due to hypothermia and unable to obtain bp due to combative resistance. patient initial cbg registered at 4 treated with 1mg  glucagon repeat reading were in the upper 20's   ED Course: Initial vital signs showed HR of 55 beats/minute, BP 99/47 mm Hg, the RR 22 breaths/minute, and the oxygen saturation 100% on NRB and a temperature of 53F (25C). Patient intubated in for airway protection. Pertinent Labs/Diagnostics Findings: Na+/ K+:131/4.8  Glucose: 286 BUN/Cr.: 31/1.91 CO2 1, Anion Gap 23, AST/ALT:284/83 WBC: 15.7K/L with neutrophil predominance  Lactic acid: >9.0 COVID PCR: Negative,  troponin: 32  CK 5249 ETOH:177 VBG: pO2 46.2; pCO2 72; pH 6.96;  HCO3 16.2, %O2 Sat 46.6.  CXR> CTH> CTA Chest> CT Abd/pelvis>see results below Medication administered in the NW:GNFAOZH given 30 cc/kg of fluids and started on broad-spectrum antibiotics with Ceftriaxone for suspected sepsis with septic shock.   Pertinent  Medical History  -incarcerated inguinal hernia s/p repair -EtOH abuse with withdrawal seizures  Significant Hospital Events: Including procedures, antibiotic start and stop dates in addition to other pertinent events   04/24/2022 - Intubated, admit to ICU extremely hypotensive.  04/28/2022 - Started on Librium taper and Seroquel BID.  04/29/2022 -  Remains with significant agitation on SAT and unable to proceed with extubation.  04/30/2022 Mental status with significant improvement. Following commands. Hgb trending down now at 7.3g/dl. Stools brown. NG tube to suction and no signs of bleeding.  05/01/2022 1pRBC given. Improved mental status.  05/02/2022 Patient H&H stable. Did well on SBT however mentation remains extremley poor with significant thick secretions therefore held off on extubation.  05/03/2022 Remains with poor mental status, not doing well with SBT. My concern is that we are looking at a trach if no improvement in mental status in the next 48hrs.   Interim History / Subjective:  No major events overnight.   This AM remains Intubated Deeply sedated. Unable to follow commnads.   H&H dropped to 6.8 mg/dl . HD stable.   Objective   Blood pressure (!) 125/51, pulse (!) 57, temperature 98.4 F (36.9 C), temperature source Oral, resp. rate 16, height 5' 7.01" (1.702 m), weight 80 kg, SpO2 95%.    Vent Mode: PRVC FiO2 (%):  [20 %-30 %] 30 % Set Rate:  [20 bmp] 20 bmp Vt Set:  [500 mL] 500 mL PEEP:  [5 cmH20] 5 cmH20 Plateau Pressure:  [15 cmH20] 15 cmH20   Intake/Output Summary (Last 24 hours) at 05/05/2023 0936 Last data filed at 05/05/2023 0700 Gross per 24 hour  Intake 3046.42 ml  Output 1545 ml  Net 1501.42 ml   Filed Weights   05/03/23 0400 05/04/23 0500 05/05/23 0400  Weight: 79.5 kg 77.8 kg 80 kg    Examination: General: Intubated deeply sedated. Following commands.  HENT: Supple neck, reactive pupils  Lungs: Imrpoved Ronchi bilaterally  Cardiovascular: Normal S1, Normal S2, RRR,  no murmurs appreciated Abdomen: Soft, non tender, non distended, + BS  Extremities: Warm and well perfused. No edema.   Labs and imaging were reviewed.   Resolved Hospital Problem list   -- Hypothermia   Assessment & Plan:  Case of 68 year old male patient with a past medical history of incarcerated inguinal hernia s/p  repair, SBO, alcohol withdrawal seizures, EtOH abuse who presented to [facility] on 04/24/2022 for unresponsiveness.  He was found to be profoundly hypothermic with temperature measuring 77 F.  Subsequently was intubated on 04/24/2022.  He was actively rewarmed and started on stress dose steroids.  Currently normothermic.  Course also complicated by rhabdomyolysis with CK peaking at 6000 and currently downtrending.  Finally course complicated by acute hypoxic respiratory failure suspecting aspiration for which she is on Zosyn and doxycycline. Tracheal aspirate w/ Moraxella Cat  Patient obstacle to extubation is mental status remains agitated with SAT.  #Toxic Metabolic Encephalopathy imroving secondary to...  #Severe hypothermia (Now resolved) #Distributive shock - likely septic in the setting of Pneumonia  #Gram - Cocci Bacteremia (1 bottle of 4) repeat negative - no speciation. TTE negative. #Alcohol withdrawal  #Aspiration pneumonia (Trach aspirate with Moraxella Ctarrhalis and proteus Mirabilis, gram stain w/ Gram + Cocci)  #AKI with Cr. Peak at 3.09mg /dl baseline 1.9 mg/dl.  # Rhabdomyolysis with Ck peak at 6000, downtrending now at 2000. Now resolved.  #Thrombocytopenia improving.  #Anemia with Hgb downtrending now at 6.8g/dl no signs of overt bleeding. Brown stools, NG output non bloody. HD stable. CT a/p without any signs of hematoma. GI consulted.   Neuro: Librium taper. Thiamine 100mg  IV daily. Precedex and Fentanyl for analgosedation. Daily SAT.  CVS: MAP goal > 65  Pulm: C/w Ceftriaxone (Last dose today) Daily SBT.  GI: PPI. Tube feeds. Lax PRN.  Heme: SCDs and keep holding heparin 5000 for dvt prophylaxis.H&H this PM. 1U PRBC. Endo: ISS. POC goals 140-180  Renal: Avoid nephrotoxic agents.   Best Practice (right click and "Reselect all SmartList Selections" daily)   Diet/type: tubefeeds DVT prophylaxis SCD Pressure ulcer(s): N/A GI prophylaxis: PPI Lines: Central line Foley:   No Code Status:  full code  Last date of multidisciplinary goals of care discussion [05/03/2022]  I spent 45 minutes caring for this patient today, including preparing to see the patient, obtaining a medical history , reviewing a separately obtained history, performing a medically appropriate examination and/or evaluation, ordering medications, tests, or procedures, documenting clinical information in the electronic health record, and independently interpreting results (not separately reported/billed) and communicating results to the patient/family/caregiver  Janann Colonel, MD [facility] 05/05/2023 9:36 AM

## 2023-05-05 NOTE — Plan of Care (Signed)
  Problem: Clinical Measurements: Goal: Ability to maintain clinical measurements within normal limits will improve Outcome: Progressing Goal: Diagnostic test results will improve Outcome: Progressing Goal: Cardiovascular complication will be avoided Outcome: Progressing   Problem: Nutrition: Goal: Adequate nutrition will be maintained Outcome: Progressing   Problem: Elimination: Goal: Will not experience complications related to bowel motility Outcome: Progressing Goal: Will not experience complications related to urinary retention Outcome: Progressing   Problem: Pain Management: Goal: General experience of comfort will improve Outcome: Progressing   Problem: Safety: Goal: Ability to remain free from injury will improve Outcome: Progressing   Problem: Skin Integrity: Goal: Risk for impaired skin integrity will decrease Outcome: Progressing   Problem: Fluid Volume: Goal: Ability to maintain a balanced intake and output will improve Outcome: Progressing   Problem: Health Behavior/Discharge Planning: Goal: Ability to identify and utilize available resources and services will improve Outcome: Progressing Goal: Ability to manage health-related needs will improve Outcome: Progressing   Problem: Metabolic: Goal: Ability to maintain appropriate glucose levels will improve Outcome: Progressing   Problem: Nutritional: Goal: Maintenance of adequate nutrition will improve Outcome: Progressing Goal: Progress toward achieving an optimal weight will improve Outcome: Progressing   Problem: Skin Integrity: Goal: Risk for impaired skin integrity will decrease Outcome: Progressing   Problem: Tissue Perfusion: Goal: Adequacy of tissue perfusion will improve Outcome: Progressing   Problem: Education: Goal: Knowledge of General Education information will improve Description: Including pain rating scale, medication(s)/side effects and non-pharmacologic comfort measures Outcome:  Not Progressing   Problem: Health Behavior/Discharge Planning: Goal: Ability to manage health-related needs will improve Outcome: Not Progressing   Problem: Clinical Measurements: Goal: Will remain free from infection Outcome: Not Progressing Goal: Respiratory complications will improve Outcome: Not Progressing   Problem: Activity: Goal: Risk for activity intolerance will decrease Outcome: Not Progressing   Problem: Coping: Goal: Level of anxiety will decrease Outcome: Not Progressing   Problem: Education: Goal: Ability to describe self-care measures that may prevent or decrease complications (Diabetes Survival Skills Education) will improve Outcome: Not Progressing Goal: Individualized Educational Video(s) Outcome: Not Progressing   Problem: Coping: Goal: Ability to adjust to condition or change in health will improve Outcome: Not Progressing

## 2023-05-05 NOTE — Plan of Care (Signed)
Continuing with plan of care. 

## 2023-05-05 NOTE — Consult Note (Signed)
Inpatient Consultation   Patient ID: Curtis [name] is a 68 y.o. male.  Requesting Provider: Janann Colonel, MD  Date of Admission: 04/25/2023  Date of Consult: 05/05/23   Reason for Consultation: anemia   Patient's Chief Complaint:   Chief Complaint  Patient presents with   unresponsive    68 year old African-American male with history of alcohol abuse with withdrawal symptoms, incarcerated hernia status postrepair, SBO who presented to the emergency department and admitted to the ICU with unresponsiveness.  Patient found down with last known well time of 3 days prior to arrival.  He was noted to be hypoglycemic with EMS.  Patient was intubated in the ICU and remained so.  Over the course of his hospitalization his hemoglobin has down trended from 12 to 6.8 today.  Through this time period, He has had no signs of GI bleeding. Bowel movements have remained brown without melena or hematochezia and vital signs stable per PCCM staff and nursing. His hemoglobin is grossly macrocytic.  Iron studies go against chronic GI bleeding.  Vital signs have remained stable despite decreasing hemoglobin.  He is on PPI for stress ulcer prophylaxis  He is not passed any spontaneous breathing trials and continues to have purulent material.  CT demonstrates diverticulosis. No findings in the stomach. Some mild small bowel thickening for possible enteritis vs ileus  Denies NSAIDs, Anti-plt agents, and anticoagulants Unknown family history of gastrointestinal disease and malignancy Previous Endoscopies: unknown, none listed in chart    Past Medical History:  Diagnosis Date   EtOH dependence (HCC)    History of echocardiogram    a. 04/2023 Echo: EF >55%, mildly reduced RV fxn, triv MR.   Incarcerated inguinal hernia    a. 10/2016 s/p repair.    Past Surgical History:  Procedure Laterality Date   INGUINAL HERNIA REPAIR Right 11/01/2016   Procedure: HERNIA REPAIR INGUINAL INCARCERATED;  Surgeon:  Leafy Ro, MD;  Location: [facility] ORS;  Service: General;  Laterality: Right;   NO PAST SURGERIES      No Known Allergies  Family History  Family history unknown: Yes    Social History   Tobacco Use   Smoking status: Every Day    Current packs/day: 1.00    Types: Cigarettes   Smokeless tobacco: Current    Types: Chew  Vaping Use   Vaping status: Never Used  Substance Use Topics   Alcohol use: Yes    Alcohol/week: 126.0 standard drinks of alcohol    Types: 126 Cans of beer per week    Comment: pt repors he drinks 5 or 6 beers a day.    Drug use: No     Pertinent GI related history and allergies were reviewed with the patient  Review of Systems  Unable to perform ROS: Intubated     Medications Home Medications No current facility-administered medications on file prior to encounter.   No current outpatient medications on file prior to encounter.   Pertinent GI related medications were reviewed with the patient  Inpatient Medications  Current Facility-Administered Medications:    0.9 %  sodium chloride infusion (Manually program via Guardrails IV Fluids), , Intravenous, Once, Judithe Modest, NP, Held at 05/02/23 1358   acetaminophen (OFIRMEV) IV 1,000 mg, 1,000 mg, Intravenous, Q6H, Ouma, Hubbard Hartshorn, NP, Stopped at 05/05/23 1610   cefTRIAXone (ROCEPHIN) 2 g in sodium chloride 0.9 % 100 mL IVPB, 2 g, Intravenous, Once, Lowella Bandy, RPH   Chlorhexidine Gluconate Cloth 2 % PADS  6 each, 6 each, Topical, Nightly, Dgayli, Khabib, MD, 6 each at 05/04/23 2159   dexmedetomidine (PRECEDEX) 400 MCG/100ML (4 mcg/mL) infusion, 0-1.2 mcg/kg/hr, Intravenous, Titrated, Harlon Ditty D, NP, Last Rate: 19.68 mL/hr at 05/05/23 1200, 1 mcg/kg/hr at 05/05/23 1200   docusate (COLACE) 50 MG/5ML liquid 100 mg, 100 mg, Per Tube, BID PRN, Lowella Bandy, RPH, 100 mg at 04/26/23 1137   docusate (COLACE) 50 MG/5ML liquid 100 mg, 100 mg, Per Tube, BID, Harlon Ditty D, NP, 100  mg at 05/05/23 0839   feeding supplement (VITAL AF 1.2 CAL) liquid 1,000 mL, 1,000 mL, Per Tube, Continuous, Dgayli, Khabib, MD, Last Rate: 60 mL/hr at 05/05/23 1200, Infusion Verify at 05/05/23 1200   fentaNYL (SUBLIMAZE) bolus via infusion 25-100 mcg, 25-100 mcg, Intravenous, Q15 min PRN, Harlon Ditty D, NP, 75 mcg at 05/05/23 0600   fentaNYL in NS (48mcg/ml) infusion-PREMIX, 25-200 mcg/hr, Intravenous, Continuous, Harlon Ditty D, NP, Last Rate: 5 mL/hr at 05/05/23 1200, 50 mcg/hr at 05/05/23 1200   folic acid (FOLVITE) tablet 1 mg, 1 mg, Per Tube, Daily, Ouma, Hubbard Hartshorn, NP, 1 mg at 05/05/23 0840   free water 200 mL, 200 mL, Per Tube, Q2H, Lowella Bandy, RPH, 200 mL at 05/05/23 1200   insulin aspart (novoLOG) injection 0-15 Units, 0-15 Units, Subcutaneous, Q4H, Harlon Ditty D, NP, 2 Units at 05/05/23 0840   midazolam (VERSED) injection 1-2 mg, 1-2 mg, Intravenous, Q3H PRN, Harlon Ditty D, NP, 2 mg at 05/05/23 0424   multivitamin with minerals tablet 1 tablet, 1 tablet, Per Tube, Daily, Ouma, Hubbard Hartshorn, NP, 1 tablet at 05/05/23 0840   norepinephrine (LEVOPHED) 16 mg in (0.064 mg/mL) premix infusion, 0-40 mcg/min, Intravenous, Titrated, Ezequiel Essex, NP, Held at 04/29/23 2214   Oral care mouth rinse, 15 mL, Mouth Rinse, Q2H, Dgayli, Khabib, MD, 15 mL at 05/05/23 1000   Oral care mouth rinse, 15 mL, Mouth Rinse, PRN, Dgayli, Khabib, MD   pantoprazole (PROTONIX) injection 40 mg, 40 mg, Intravenous, Q12H, Assaker, Jean-Pierre, MD, 40 mg at 05/05/23 0840   polyethylene glycol (MIRALAX / GLYCOLAX) packet 17 g, 17 g, Oral, Daily PRN, Ouma, Hubbard Hartshorn, NP   polyethylene glycol (MIRALAX / GLYCOLAX) packet 17 g, 17 g, Per Tube, Daily, Harlon Ditty D, NP, 17 g at 05/05/23 0839   QUEtiapine (SEROQUEL) tablet 25 mg, 25 mg, Per Tube, BID, Assaker, Jean-Pierre, MD, 25 mg at 05/05/23 0840   thiamine (VITAMIN B1) injection 100 mg, 100 mg, Intravenous,  Q24H, Harlon Ditty D, NP, 100 mg at 05/05/23 0004  acetaminophen Stopped (05/05/23 0609)   cefTRIAXone (ROCEPHIN)  IV     dexmedetomidine (PRECEDEX) IV infusion 1 mcg/kg/hr (05/05/23 1200)   feeding supplement (VITAL AF 1.2 CAL) 60 mL/hr at 05/05/23 1200   fentaNYL infusion INTRAVENOUS 50 mcg/hr (05/05/23 1200)   norepinephrine (LEVOPHED) Adult infusion Stopped (04/29/23 2214)    docusate, fentaNYL, midazolam, mouth rinse, polyethylene glycol   Objective   Vitals:   05/05/23 1018 05/05/23 1022 05/05/23 1030 05/05/23 1037  BP: (!) 135/41  (!) 131/52 (!) 135/57  Pulse: 62 61 (!) 59 61  Resp: (!) 26 19 (!) 22 19  Temp: 98 F (36.7 C)   97.9 F (36.6 C)  TempSrc: Axillary   Axillary  SpO2: 93% 93% 94% 93%  Weight:      Height:         Physical Exam Vitals and nursing note reviewed.  Constitutional:  General: He is not in acute distress.    Appearance: He is ill-appearing. He is not toxic-appearing or diaphoretic.  HENT:     Head: Normocephalic and atraumatic.     Nose: Nose normal.     Mouth/Throat:     Comments: Ogt and ETT in place Eyes:     General: No scleral icterus. Cardiovascular:     Rate and Rhythm: Normal rate and regular rhythm.  Pulmonary:     Effort: No respiratory distress.     Comments: Mechanical breath sounds bilaterally Abdominal:     General: Bowel sounds are normal. There is no distension.     Palpations: Abdomen is soft.     Tenderness: There is no abdominal tenderness. There is no guarding or rebound.  Musculoskeletal:     Cervical back: Neck supple.     Right lower leg: Edema present.     Left lower leg: Edema present.  Skin:    General: Skin is warm and dry.     Coloration: Skin is not jaundiced or pale.  Neurological:     Comments: sedated     Laboratory Data Recent Labs  Lab 05/04/23 0400 05/04/23 0900 05/04/23 2359 05/05/23 0400  WBC 15.0* 14.4*  --  15.2*  HGB 7.5* 7.1* 7.0* 6.8*  HCT 23.9* 22.7* 22.5* 21.7*  PLT 195  199  --  208   Recent Labs  Lab 05/02/23 1428 05/03/23 0409 05/04/23 0400 05/04/23 2011 05/05/23 0400  NA  --    < > 151* 150* 151*  K  --    < > 4.5 4.2 4.1  CL  --    < > 120* 118* 120*  CO2  --    < > 23 23 23   BUN  --    < > 90* 80* 78*  CALCIUM  --    < > 9.2 8.9 8.7*  PROT 5.5*  --   --   --   --   BILITOT 1.1  --   --   --   --   ALKPHOS 72  --   --   --   --   ALT 41  --   --   --   --   AST 26  --   --   --   --   GLUCOSE  --    < > 142* 143* 140*   < > = values in this interval not displayed.   No results for input(s): "INR" in the last 168 hours.  No results for input(s): "LIPASE" in the last 72 hours.      Imaging Studies: MR BRAIN WO CONTRAST Result Date: 05/03/2023 CLINICAL DATA:  Mental status change, unresponsive EXAM: MRI HEAD WITHOUT CONTRAST TECHNIQUE: Multiplanar, multiecho pulse sequences of the brain and surrounding structures were obtained without intravenous contrast. COMPARISON:  06/27/2016 MRI head, 04/25/2023 CT head FINDINGS: Brain: No restricted diffusion to suggest acute or subacute infarct. No acute hemorrhage, mass, mass effect, or midline shift. No hydrocephalus or extra-axial collection. Pituitary and craniocervical junction within normal limits. No hemosiderin deposition to suggest remote hemorrhage. This scattered T2 hyperintense signal in the periventricular white matter, likely the sequela of mild chronic small vessel ischemic disease. Mildly advanced cerebral atrophy for age, without lobar predominance. Vascular: Normal arterial flow voids. Skull and upper cervical spine: Normal marrow signal. Sinuses/Orbits: Mucosal thickening throughout the paranasal sinuses, most advanced in the right maxillary sinuses. Air-fluid level in the left sphenoid sinus and right maxillary  sinus, which may be secondary to intubation. No acute finding in the orbits. Remote left lamina papyracea defect. Other: Fluid in the mastoid air cells and air-fluid level in the  nasopharynx, also secondary to intubation. IMPRESSION: No acute intracranial process. No evidence of acute or subacute infarct. No evidence of hypoxic ischemic injury. Electronically Signed   By: Wiliam Ke M.D.   On: 05/03/2023 14:09    Assessment:   # Macrocytic anemia - progressively worsening anemia in setting of long hospitalization with repeated lab draws - there have been no signs of gib. Stools remain brown and ugi lavage was normal per report - iron studies not supportive of chronic blood loss - suspect nutritional component as well given long standing etoh abuse  # acute encephalopathy - receiving lactulose  # etoh abuse and withdrawal  # sepsis 2/2 pna  # aki in setting of rhabdomyolysis  # unresponsiveness - found down (last known well prior to hospitalization was 3 days)  # acute respiratory failure  Plan:  Given there are no signs of GIB (as described above), macrocytosis (no iron deficiency), and the patient's condition, Endoscopy does not seem indicated at this current juncture Agree with bid ppi for stress ulcer prophylaxis Continue mvi with thiamine Ciwa protocol Can consider tagged rbc scan if hgb continues to drop  Monitor H&H.  Transfusion and resuscitation as per primary team Avoid frequent lab draws to prevent lab induced anemia Supportive care and antiemetics as per primary team Maintain two sites IV access Avoid nsaids Monitor for GIB.  Management of other medical comorbidities as per primary team  I personally performed the service.  GI to sign off. Available as needed. Please do not hesitate to call regarding questions or concerns.  Thank you for allowing [place] to participate in this patient's care.   Jaynie Collins, DO [facility]  Portions of the record may have been created with voice recognition software. Occasional wrong-word or 'sound-a-like' substitutions may have occurred due to the inherent limitations of  voice recognition software.  Read the chart carefully and recognize, using context, where substitutions may have occurred.

## 2023-05-05 NOTE — Progress Notes (Signed)
Patient had an eventful day, at the beginning of the shift the patient's sedation's medications were decreased and he tolerated this well, patient was also able to follow commands and nod appropriately at questions asked.  However, when patient had a bowel movement and was turned to his side, he began coughing and started to have more bowel movements.  Patient then became agitated and required sedation medications to go back up as patient also became combative, HR to the 130s and desaturation down to 60%.  Soon following, patient disconnected himself from the ventilator resulting in a rapid decrease in his saturations even after reattachement down to 24% and slowly back up to the 50s, 70s, and then 90s.  Dr. Larinda Buttery at bedside during this event.  Patient was adequately re-sedated per orders and no more events of this type the rest of the shift.

## 2023-05-05 NOTE — Progress Notes (Signed)
PHARMACY CONSULT NOTE  Pharmacy Consult for Electrolyte Monitoring and Replacement   Recent Labs: Potassium (mmol/L)  Date Value  05/05/2023 4.1  08/23/2013 3.3 (L)   Magnesium (mg/dL)  Date Value  65/78/4696 2.4  08/23/2013 1.4 (L)   Calcium (mg/dL)  Date Value  29/52/8413 8.7 (L)   Calcium, Total (mg/dL)  Date Value  24/40/1027 9.5   Albumin (g/dL)  Date Value  25/36/6440 2.1 (L)   Phosphorus (mg/dL)  Date Value  34/74/2595 3.2  08/23/2013 2.2 (L)   Sodium (mmol/L)  Date Value  05/05/2023 151 (H)  08/23/2013 128 (L)   Assessment: 68 y/o male with h/o SBO secondary to strangulated inguinal hernia s/p repair 2018, etoh abuse and seizures who is admitted with AMS, aspiration pneumonia, sepsis, AKI and rhabdomyolysis. Pharmacy is asked to follow and replace electrolytes while in CCU  Nutrition: Vital AF + FWF 200 mL per tube every 4 hours  Goal of Therapy:  Electrolytes WNL  Plan:  --increase free water flushes to 200 mL every 4 hoursevery 2 hours --Will re-check electrolytes with AM labs tomorrow  Lowella Bandy, PharmD Clinical Pharmacist 05/05/2023 7:00 AM

## 2023-05-05 NOTE — Progress Notes (Signed)
Order received to obtain h and h.

## 2023-05-05 NOTE — Progress Notes (Signed)
Patient had 4 large loose bowel movements that caused significant distress when cleaning the patient as the patient would desaturate to 60%, become agitated, and heart rate would increase to the 120s-130s.  Dr. Larinda Buttery notified and received order to place flexi-seal.

## 2023-05-06 ENCOUNTER — Inpatient Hospital Stay: Payer: 59

## 2023-05-06 DIAGNOSIS — J9601 Acute respiratory failure with hypoxia: Secondary | ICD-10-CM | POA: Diagnosis not present

## 2023-05-06 DIAGNOSIS — J189 Pneumonia, unspecified organism: Secondary | ICD-10-CM | POA: Diagnosis not present

## 2023-05-06 DIAGNOSIS — G9341 Metabolic encephalopathy: Secondary | ICD-10-CM | POA: Diagnosis not present

## 2023-05-06 DIAGNOSIS — N179 Acute kidney failure, unspecified: Secondary | ICD-10-CM | POA: Diagnosis not present

## 2023-05-06 LAB — TYPE AND SCREEN
ABO/RH(D): B POS
Antibody Screen: NEGATIVE
Unit division: 0
Unit division: 0

## 2023-05-06 LAB — BPAM RBC
Blood Product Expiration Date: 202502112359
Blood Product Expiration Date: 202502012359
ISSUE DATE / TIME: 202501191017
ISSUE DATE / TIME: 202501161541
Unit Type and Rh: 5100
Unit Type and Rh: 7300

## 2023-05-06 LAB — GLUCOSE, CAPILLARY
Glucose-Capillary: 109 mg/dL — ABNORMAL HIGH (ref 70–99)
Glucose-Capillary: 115 mg/dL — ABNORMAL HIGH (ref 70–99)
Glucose-Capillary: 116 mg/dL — ABNORMAL HIGH (ref 70–99)
Glucose-Capillary: 119 mg/dL — ABNORMAL HIGH (ref 70–99)
Glucose-Capillary: 135 mg/dL — ABNORMAL HIGH (ref 70–99)
Glucose-Capillary: 137 mg/dL — ABNORMAL HIGH (ref 70–99)

## 2023-05-06 LAB — CBC
HCT: 23.9 % — ABNORMAL LOW (ref 39.0–52.0)
Hemoglobin: 7.5 g/dL — ABNORMAL LOW (ref 13.0–17.0)
MCH: 33 pg (ref 26.0–34.0)
MCHC: 31.4 g/dL (ref 30.0–36.0)
MCV: 105.3 fL — ABNORMAL HIGH (ref 80.0–100.0)
Platelets: 205 10*3/uL (ref 150–400)
RBC: 2.27 MIL/uL — ABNORMAL LOW (ref 4.22–5.81)
RDW: 18.2 % — ABNORMAL HIGH (ref 11.5–15.5)
WBC: 15.6 10*3/uL — ABNORMAL HIGH (ref 4.0–10.5)
nRBC: 0 % (ref 0.0–0.2)

## 2023-05-06 LAB — BASIC METABOLIC PANEL
Anion gap: 8 (ref 5–15)
BUN: 66 mg/dL — ABNORMAL HIGH (ref 8–23)
CO2: 22 mmol/L (ref 22–32)
Calcium: 8.3 mg/dL — ABNORMAL LOW (ref 8.9–10.3)
Chloride: 119 mmol/L — ABNORMAL HIGH (ref 98–111)
Creatinine, Ser: 1.97 mg/dL — ABNORMAL HIGH (ref 0.61–1.24)
GFR, Estimated: 36 mL/min — ABNORMAL LOW (ref >60)
Glucose, Bld: 132 mg/dL — ABNORMAL HIGH (ref 70–99)
Potassium: 4 mmol/L (ref 3.5–5.1)
Sodium: 149 mmol/L — ABNORMAL HIGH (ref 135–145)

## 2023-05-06 LAB — MAGNESIUM: Magnesium: 2.1 mg/dL (ref 1.7–2.4)

## 2023-05-06 MED ORDER — QUETIAPINE FUMARATE 25 MG PO TABS
50.0000 mg | ORAL_TABLET | Freq: Two times a day (BID) | ORAL | Status: DC
  Administered 2023-05-06: 50 mg
  Filled 2023-05-06: qty 2

## 2023-05-06 MED ORDER — FREE WATER
200.0000 mL | Status: DC
  Administered 2023-05-06 – 2023-05-07 (×5): 200 mL

## 2023-05-06 MED ORDER — THIAMINE MONONITRATE 100 MG PO TABS
100.0000 mg | ORAL_TABLET | Freq: Every day | ORAL | Status: DC
  Administered 2023-05-06 – 2023-05-15 (×7): 100 mg
  Filled 2023-05-06 (×7): qty 1

## 2023-05-06 MED ORDER — ENOXAPARIN SODIUM 40 MG/0.4ML IJ SOSY
40.0000 mg | PREFILLED_SYRINGE | INTRAMUSCULAR | Status: DC
  Administered 2023-05-06 – 2023-05-15 (×10): 40 mg via SUBCUTANEOUS
  Filled 2023-05-06 (×10): qty 0.4

## 2023-05-06 NOTE — Progress Notes (Signed)
Attempted to contact pt's daughter Charna Elizabeth via telephone.  No answer, left HIPAA compliant voicemail requesting callback at her convenience for update.     Harlon Ditty, AGACNP-BC [facility] Prefer epic messenger for cross cover needs If after hours, please call E-link

## 2023-05-06 NOTE — Progress Notes (Signed)
PHARMACY CONSULT NOTE  Pharmacy Consult for Electrolyte Monitoring and Replacement   Recent Labs: Potassium (mmol/L)  Date Value  05/06/2023 4.0  08/23/2013 3.3 (L)   Magnesium (mg/dL)  Date Value  16/01/9603 2.1  08/23/2013 1.4 (L)   Calcium (mg/dL)  Date Value  54/12/8117 8.3 (L)   Calcium, Total (mg/dL)  Date Value  14/78/2956 9.5   Albumin (g/dL)  Date Value  21/30/8657 2.1 (L)   Phosphorus (mg/dL)  Date Value  84/69/6295 3.2  08/23/2013 2.2 (L)   Sodium (mmol/L)  Date Value  05/06/2023 149 (H)  08/23/2013 128 (L)   Assessment: 68 y/o male with h/o SBO secondary to strangulated inguinal hernia s/p repair 2018, etoh abuse and seizures who is admitted with AMS, aspiration pneumonia, sepsis, AKI and rhabdomyolysis. Pharmacy is asked to follow and replace electrolytes while in CCU  Nutrition: Vital AF + FWF 200 mL per tube every 4 hours  Goal of Therapy:  Electrolytes WNL  Plan:  --Na 149, improved will increased free water --Will re-check electrolytes with AM labs tomorrow  Tressie Ellis 05/06/2023 8:23 AM

## 2023-05-06 NOTE — Plan of Care (Signed)
  Problem: Health Behavior/Discharge Planning: Goal: Ability to manage health-related needs will improve Outcome: Progressing   Problem: Pain Management: Goal: General experience of comfort will improve Outcome: Progressing   Problem: Safety: Goal: Ability to remain free from injury will improve Outcome: Progressing   Problem: Coping: Goal: Ability to adjust to condition or change in health will improve Outcome: Progressing

## 2023-05-06 NOTE — Progress Notes (Signed)
NAME:  Curtis [name], MRN:  [id number], DOB:  May 09, 1955, LOS: 11 ADMISSION DATE:  04/25/2023, CONSULTATION DATE:  04/25/23 CHIEF COMPLAINT:  Severe Hypothermia   Brief Pt Description / Synopsis:  68 year old male patient with a past medical history of incarcerated inguinal hernia s/p repair, SBO, alcohol withdrawal seizures, EtOH abuse who presented to [facility] on 04/24/2022 for unresponsiveness. He was found to be profoundly hypothermic with temperature measuring 77 F. Subsequently was intubated on 04/24/2022. He was actively rewarmed and started on stress dose steroids. Currently normothermic. Course also complicated by rhabdomyolysis with CK peaking at 6000 and currently downtrending. Finally course complicated by acute hypoxic respiratory failure suspecting aspiration for which she is on Zosyn and doxycycline. Tracheal aspirate w/ Moraxella Cat Patient obstacle to extubation is mental status remains agitated with SAT.  History of Present Illness:  le with significant PMH of incarcerated inguinal hernia s/p repair, SBO, alcohol withdrawal seizure, EtOH abuse who presented to the ED with chief complaints of unresponsiveness.   Per ED report, EMS was dispatched to patient's residence by a neighbor who found the patient unconscious.  On EMS arrival, patient was found unresponsive on floor of bedroom. Neighbor report LKNW was 3 days ago. Patient was initially combative and would tense resist any treatment. EMS report they were unable to obtain a temperature or spo2 due to hypothermia and unable to obtain bp due to combative resistance. patient initial cbg registered at 21 treated with 1mg  glucagon repeat reading were in the upper 20's   ED Course: Initial vital signs showed HR of 55 beats/minute, BP 99/47 mm Hg, the RR 22 breaths/minute, and the oxygen saturation 100% on NRB and a temperature of 36F (25C). Patient intubated in for airway protection. Pertinent Labs/Diagnostics Findings: Na+/ K+:131/4.8   Glucose: 286 BUN/Cr.: 31/1.91 CO2 1, Anion Gap 23, AST/ALT:284/83 WBC: 15.7K/L with neutrophil predominance  Lactic acid: >9.0 COVID PCR: Negative,  troponin: 32  CK 5249 ETOH:177 VBG: pO2 46.2; pCO2 72; pH 6.96;  HCO3 16.2, %O2 Sat 46.6.  CXR> CTH> CTA Chest> CT Abd/pelvis>see results below Medication administered in the NW:GNFAOZH given 30 cc/kg of fluids and started on broad-spectrum antibiotics with Ceftriaxone for suspected sepsis with septic shock.   Pertinent  Medical History   Past Medical History:  Diagnosis Date   EtOH dependence (HCC)    History of echocardiogram    a. 04/2023 Echo: EF >55%, mildly reduced RV fxn, triv MR.   Incarcerated inguinal hernia    a. 10/2016 s/p repair.    Micro Data:  1/9: SARS-CoV-2/RSV/influenza PCR>> negative 1/9: HIV screen>> nonreactive 1/9: Blood culture x 2>> no growth 1/9: MRSA PCR>> negative 1/10: Respiratory viral panel>> negative 1/10: Mycoplasma pneumoniae IgM>> less than 770 1/10: Strep pneumo and Legionella urinary antigens>> negative 1/10: Tracheal aspirate>> Proteus mirabilis, Moraxella Catarrhalis (beta lactamase +) 1/11: Acute viral hepatitis panel>> nonreactive 1/13: Blood culture x 2>> no growth  Antimicrobials:   Anti-infectives (From admission, onward)    Start     Dose/Rate Route Frequency Ordered Stop   05/05/23 0900  cefTRIAXone (ROCEPHIN) 2 g in sodium chloride 0.9 % 100 mL IVPB        2 g 200 mL/hr over 30 Minutes Intravenous  Once 05/04/23 1202 05/05/23 1312   05/01/23 1030  cefTRIAXone (ROCEPHIN) 2 g in sodium chloride 0.9 % 100 mL IVPB        2 g 200 mL/hr over 30 Minutes Intravenous Daily 05/01/23 0932 05/04/23 1913   05/01/23 1000  cefTRIAXone (ROCEPHIN)  1 g in sodium chloride 0.9 % 100 mL IVPB  Status:  Discontinued        1 g 200 mL/hr over 30 Minutes Intravenous Every 24 hours 05/01/23 0756 05/01/23 0932   04/27/23 1400  piperacillin-tazobactam (ZOSYN) IVPB 3.375 g  Status:  Discontinued         3.375 g 12.5 mL/hr over 240 Minutes Intravenous Every 8 hours 04/27/23 0744 05/01/23 0756   04/27/23 1000  doxycycline (VIBRAMYCIN) 100 mg in dextrose 5 % 250 mL IVPB        100 mg 125 mL/hr over 120 Minutes Intravenous Every 12 hours 04/26/23 1358 04/29/23 1900   04/26/23 2200  piperacillin-tazobactam (ZOSYN) IVPB 4.5 g  Status:  Discontinued        4.5 g 200 mL/hr over 30 Minutes Intravenous Every 8 hours 04/26/23 1921 04/27/23 0744   04/26/23 1000  azithromycin (ZITHROMAX) 500 mg in sodium chloride 0.9 % 250 mL IVPB  Status:  Discontinued        500 mg 250 mL/hr over 60 Minutes Intravenous Every 24 hours 04/26/23 0401 04/26/23 1357   04/26/23 0600  piperacillin-tazobactam (ZOSYN) IVPB 3.375 g  Status:  Discontinued        3.375 g 12.5 mL/hr over 240 Minutes Intravenous Every 8 hours 04/26/23 0407 04/26/23 1921   04/25/23 1915  cefTRIAXone (ROCEPHIN) 2 g in sodium chloride 0.9 % 100 mL IVPB        2 g 200 mL/hr over 30 Minutes Intravenous  Once 04/25/23 1914 04/25/23 2013       Significant Hospital Events: Including procedures, antibiotic start and stop dates in addition to other pertinent events   04/24/2022 - Intubated, admit to ICU extremely hypotensive.  04/28/2022 - Started on Librium taper and Seroquel BID.  04/29/2022 - Remains with significant agitation on SAT and unable to proceed with extubation.  04/30/2022 Mental status with significant improvement. Following commands. Hgb trending down now at 7.3g/dl. Stools brown. NG tube to suction and no signs of bleeding.  05/01/2022 1pRBC given. Improved mental status.  05/02/2022 Patient H&H stable. Did well on SBT however mentation remains extremley poor with significant thick secretions therefore held off on extubation.  05/03/2022 Remains with poor mental status, not doing well with SBT. My concern is that we are looking at a trach if no improvement in mental status in the next 48hrs.  05/05/2023: Initially tolerated WUA and SBT,  however with BM and turning, agitated and severe hypoxia. 05/06/2023: On minimal vent settings, FAILED SBT.  Increase Seroquel to help with agitation.  Decrease free water flushes to 200 q4h.  Interim History / Subjective:  -No significant events noted overnight -Afebrile, hemodynamically stable, not requiring pressors -On minimal vent settings and able to follow simple commands ~ placed in SBT 5/8, but failed due to increased WOB  Objective   Blood pressure (!) 121/48, pulse 60, temperature 97.8 F (36.6 C), temperature source Axillary, resp. rate 20, height 5' 7.01" (1.702 m), weight 75.8 kg, SpO2 90%.    Vent Mode: PRVC FiO2 (%):  [30 %-50 %] 50 % Set Rate:  [20 bmp] 20 bmp Vt Set:  [500 mL] 500 mL PEEP:  [8 cmH20] 8 cmH20 Plateau Pressure:  [20 cmH20] 20 cmH20   Intake/Output Summary (Last 24 hours) at 05/06/2023 0826 Last data filed at 05/06/2023 0700 Gross per 24 hour  Intake 4477.05 ml  Output 1890 ml  Net 2587.05 ml   Filed Weights   05/04/23 0500 05/05/23 0400  05/06/23 0344  Weight: 77.8 kg 80 kg 75.8 kg    Examination: General: Acutely ill-appearing male, laying in bed, intubated sedated, no acute distress HENT: Atraumatic, normocephalic, neck supple, no JVD Lungs: Coarse breath sounds throughout, even, overbreathing the ventilator Cardiovascular: Regular rate and rhythm, S1-S2, no murmurs, rubs, gallops Abdomen: Soft, nontender, nondistended, no guarding, tenderness, bowel sounds positive x 4 Extremities: Normal bulk and tone, no deformities, no edema, warm and well-perfused Neuro: Lightly sedated, arouses to voice, following all commands, no focal deficits noted, pupils PERRLA GU: Foley catheter in place draining yellow urine  Resolved Hospital Problem list     Assessment & Plan:   #Septic Shock ~ RESOLVED Echocardiogram 04/26/23: LVEF >55%, diastolic function unable to be evaluated, RV systolic function mildly reduced, RV size is normal -Continuous cardiac  monitoring -Maintain MAP >65 -IV fluids -Vasopressors as needed to maintain MAP goal -Trend lactic acid until normalized -Trend HS Troponin until peaked  #Acute Hypoxic Respiratory Failure  #Aspiration Pneumonia (Trach aspirate with Moraxella Ctarrhalis and proteus Mirabilis, gram stain w/ Gram + Cocci  -Full vent support, implement lung protective strategies -Plateau pressures less than 30 cm H20 -Wean FiO2 & PEEP as tolerated to maintain O2 sats >92% -Follow intermittent Chest X-ray & ABG as needed -Spontaneous Breathing Trials when respiratory parameters met and mental status permits -Implement VAP Bundle -Prn Bronchodilators -Completed course of ABX as above  #Aspiration Pneumonia ((Trach aspirate with Moraxella Ctarrhalis and proteus Mirabilis, gram stain w/ Gram + Cocci ) ~ TREATED -Monitor fever curve -Trend WBC's  -Follow cultures as above Completed course of ABX as above  #AKI ~IMPROVING #Hypernatremia ~ IMPROVING #Rhabdomyolysis ~ RESOLVED -Monitor I&O's / urinary output -Follow BMP -Ensure adequate renal perfusion -Avoid nephrotoxic agents as able -Replace electrolytes as indicated ~ Pharmacy following for assistance with electrolyte replacement -Decrease free water flushes to 200 q4h  #Anemia #Thrombocytopenia ~ RESOLVED -Monitor for S/Sx of bleeding -Trend CBC -SCD's for VTE Prophylaxis  -Transfuse for Hgb <7 -CT Abdomen & Pelvis without any sign of hematoma -GI consulted, appreciate input: No indication for EGD currently, continue PPI BID for SUP, if further drop in Hgb recommends Tagged RBC scan  #Acute Metabolic Encephalopathy #ETOH withdrawal #Sedation needs in setting of mechanical ventilation -Maintain a RASS goal of 0 to -1 -Fentanyl and Precedex to maintain RASS goal -Avoid sedating medications as able -Daily wake up assessment -Completed Librium taper -Continue Thiamine, MVI, folic acid -Increase Seroquel to 50 mg BID    Best Practice  (right click and "Reselect all SmartList Selections" daily)   Diet/type: tubefeeds DVT prophylaxis: LMWH GI prophylaxis: PPI Lines: N/A Foley:  Yes, and it is still needed Code Status:  full code Last date of multidisciplinary goals of care discussion [1/20]  1/20: Will update pt's family when they arrive at bedside.  Labs   CBC: Recent Labs  Lab 05/02/23 1428 05/02/23 2033 05/03/23 0409 05/04/23 0400 05/04/23 0900 05/04/23 2359 05/05/23 0400 05/05/23 1637 05/06/23 0428  WBC 9.2  --  12.0* 15.0* 14.4*  --  15.2*  --  15.6*  NEUTROABS 4.4  --   --   --   --   --   --   --   --   HGB 6.6*   < > 8.7* 7.5* 7.1* 7.0* 6.8* 7.8* 7.5*  HCT 20.9*   < > 27.4* 23.9* 22.7* 22.5* 21.7* 24.4* 23.9*  MCV 106.1*  --  103.0* 107.7* 106.6*  --  105.9*  --  105.3*  PLT 114*  --  141* 195 199  --  208  --  205   < > = values in this interval not displayed.    Basic Metabolic Panel: Recent Labs  Lab 04/30/23 0327 04/30/23 1339 05/01/23 0335 05/01/23 1252 05/02/23 0405 05/03/23 0409 05/04/23 0400 05/04/23 2011 05/05/23 0400 05/06/23 0428  NA 139  --  144  --  143 147* 151* 150* 151* 149*  K 3.0*   < > 3.1*   < > 3.6 4.1 4.5 4.2 4.1 4.0  CL 100  --  103  --  109 113* 120* 118* 120* 119*  CO2 24  --  26  --  22 21* 23 23 23 22   GLUCOSE 141*  --  112*  --  137* 122* 142* 143* 140* 132*  BUN 70*  --  82*  --  92* 92* 90* 80* 78* 66*  CREATININE 2.77*  --  2.82*  --  2.98* 2.90* 2.53* 2.36* 2.23* 1.97*  CALCIUM 7.2*  --  8.1*  --  8.5* 9.0 9.2 8.9 8.7* 8.3*  MG 2.4  --  2.2  --  2.2 2.3 2.4  --   --  2.1  PHOS 4.2  --  3.5  --  3.6 3.3 3.2  --   --   --    < > = values in this interval not displayed.   GFR: Estimated Creatinine Clearance: 33.1 mL/min (A) (by C-G formula based on SCr of 1.97 mg/dL (H)). Recent Labs  Lab 05/01/23 0335 05/02/23 0405 05/02/23 1300 05/03/23 0409 05/04/23 0400 05/04/23 0900 05/05/23 0400 05/06/23 0428  PROCALCITON 2.85 1.97  --  1.17  --   --    --   --   WBC 6.3 7.7   < > 12.0* 15.0* 14.4* 15.2* 15.6*   < > = values in this interval not displayed.    Liver Function Tests: Recent Labs  Lab 04/30/23 0327 05/01/23 0335 05/02/23 1428  AST  --   --  26  ALT  --   --  41  ALKPHOS  --   --  72  BILITOT  --   --  1.1  PROT  --   --  5.5*  ALBUMIN 2.3* 2.3* 2.1*   No results for input(s): "LIPASE", "AMYLASE" in the last 168 hours. No results for input(s): "AMMONIA" in the last 168 hours.  ABG    Component Value Date/Time   PHART 7.39 04/28/2023 0827   PCO2ART 45 04/28/2023 0827   PO2ART 85 04/28/2023 0827   HCO3 27.2 04/28/2023 0827   ACIDBASEDEF 8.8 (H) 04/26/2023 0500   O2SAT 96.8 04/28/2023 0827     Coagulation Profile: No results for input(s): "INR", "PROTIME" in the last 168 hours.  Cardiac Enzymes: No results for input(s): "CKTOTAL", "CKMB", "CKMBINDEX", "TROPONINI" in the last 168 hours.  HbA1C: Hgb A1c MFr Bld  Date/Time Value Ref Range Status  04/27/2023 04:24 AM 5.1 4.8 - 5.6 % Final    Comment:    (NOTE) Pre diabetes:          5.7%-6.4%  Diabetes:              >6.4%  Glycemic control for   <7.0% adults with diabetes   04/26/2023 03:25 AM 5.1 4.8 - 5.6 % Final    Comment:    (NOTE) Pre diabetes:          5.7%-6.4%  Diabetes:              >  6.4%  Glycemic control for   <7.0% adults with diabetes     CBG: Recent Labs  Lab 05/05/23 1605 05/05/23 1941 05/05/23 2338 05/06/23 0326 05/06/23 0737  GLUCAP 94 128* 120* 135* 119*    Review of Systems:   Unable to assess due to AMS/Intubation/sedation    Past Medical History:  He,  has a past medical history of EtOH dependence (HCC), History of echocardiogram, and Incarcerated inguinal hernia.   Surgical History:   Past Surgical History:  Procedure Laterality Date   INGUINAL HERNIA REPAIR Right 11/01/2016   Procedure: HERNIA REPAIR INGUINAL INCARCERATED;  Surgeon: Leafy Ro, MD;  Location: [facility] ORS;  Service: General;   Laterality: Right;   NO PAST SURGERIES       Social History:   reports that he has been smoking cigarettes. His smokeless tobacco use includes chew. He reports current alcohol use of about 126.0 standard drinks of alcohol per week. He reports that he does not use drugs.   Family History:  His Family history is unknown by patient.   Allergies No Known Allergies   Home Medications  Prior to Admission medications   Not on File     Critical care time: 40 minutes     Harlon Ditty, AGACNP-BC [facility] Prefer epic messenger for cross cover needs If after hours, please call E-link

## 2023-05-07 ENCOUNTER — Inpatient Hospital Stay: Payer: 59

## 2023-05-07 DIAGNOSIS — J9601 Acute respiratory failure with hypoxia: Secondary | ICD-10-CM | POA: Diagnosis not present

## 2023-05-07 DIAGNOSIS — G9341 Metabolic encephalopathy: Secondary | ICD-10-CM

## 2023-05-07 DIAGNOSIS — J189 Pneumonia, unspecified organism: Secondary | ICD-10-CM | POA: Diagnosis not present

## 2023-05-07 DIAGNOSIS — I4892 Unspecified atrial flutter: Secondary | ICD-10-CM

## 2023-05-07 DIAGNOSIS — I214 Non-ST elevation (NSTEMI) myocardial infarction: Secondary | ICD-10-CM

## 2023-05-07 DIAGNOSIS — A419 Sepsis, unspecified organism: Secondary | ICD-10-CM

## 2023-05-07 LAB — GLUCOSE, CAPILLARY
Glucose-Capillary: 106 mg/dL — ABNORMAL HIGH (ref 70–99)
Glucose-Capillary: 119 mg/dL — ABNORMAL HIGH (ref 70–99)
Glucose-Capillary: 122 mg/dL — ABNORMAL HIGH (ref 70–99)
Glucose-Capillary: 133 mg/dL — ABNORMAL HIGH (ref 70–99)
Glucose-Capillary: 56 mg/dL — ABNORMAL LOW (ref 70–99)
Glucose-Capillary: 74 mg/dL (ref 70–99)
Glucose-Capillary: 80 mg/dL (ref 70–99)
Glucose-Capillary: <10 mg/dL — CL (ref 70–99)

## 2023-05-07 LAB — RENAL FUNCTION PANEL
Albumin: 2 g/dL — ABNORMAL LOW (ref 3.5–5.0)
Anion gap: 10 (ref 5–15)
BUN: 60 mg/dL — ABNORMAL HIGH (ref 8–23)
CO2: 21 mmol/L — ABNORMAL LOW (ref 22–32)
Calcium: 8.7 mg/dL — ABNORMAL LOW (ref 8.9–10.3)
Chloride: 116 mmol/L — ABNORMAL HIGH (ref 98–111)
Creatinine, Ser: 1.84 mg/dL — ABNORMAL HIGH (ref 0.61–1.24)
GFR, Estimated: 39 mL/min — ABNORMAL LOW (ref >60)
Glucose, Bld: 146 mg/dL — ABNORMAL HIGH (ref 70–99)
Phosphorus: 4.3 mg/dL (ref 2.5–4.6)
Potassium: 4.1 mmol/L (ref 3.5–5.1)
Sodium: 147 mmol/L — ABNORMAL HIGH (ref 135–145)

## 2023-05-07 LAB — CBC
HCT: 22.9 % — ABNORMAL LOW (ref 39.0–52.0)
Hemoglobin: 7.2 g/dL — ABNORMAL LOW (ref 13.0–17.0)
MCH: 32.6 pg (ref 26.0–34.0)
MCHC: 31.4 g/dL (ref 30.0–36.0)
MCV: 103.6 fL — ABNORMAL HIGH (ref 80.0–100.0)
Platelets: 193 10*3/uL (ref 150–400)
RBC: 2.21 MIL/uL — ABNORMAL LOW (ref 4.22–5.81)
RDW: 17.3 % — ABNORMAL HIGH (ref 11.5–15.5)
WBC: 13.3 10*3/uL — ABNORMAL HIGH (ref 4.0–10.5)
nRBC: 0 % (ref 0.0–0.2)

## 2023-05-07 LAB — BRAIN NATRIURETIC PEPTIDE: B Natriuretic Peptide: 864.2 pg/mL — ABNORMAL HIGH (ref 0.0–100.0)

## 2023-05-07 MED ORDER — ORAL CARE MOUTH RINSE
15.0000 mL | OROMUCOSAL | Status: DC
  Administered 2023-05-07 – 2023-05-08 (×4): 15 mL via OROMUCOSAL

## 2023-05-07 MED ORDER — INSULIN ASPART 100 UNIT/ML IJ SOLN
0.0000 [IU] | INTRAMUSCULAR | Status: DC
Start: 2023-05-07 — End: 2023-05-08

## 2023-05-07 MED ORDER — METOPROLOL TARTRATE 5 MG/5ML IV SOLN
5.0000 mg | Freq: Once | INTRAVENOUS | Status: AC
  Administered 2023-05-07: 5 mg via INTRAVENOUS

## 2023-05-07 MED ORDER — HALOPERIDOL LACTATE 5 MG/ML IJ SOLN
2.0000 mg | Freq: Four times a day (QID) | INTRAMUSCULAR | Status: DC | PRN
  Administered 2023-05-07: 2 mg via INTRAVENOUS
  Filled 2023-05-07: qty 1

## 2023-05-07 MED ORDER — DEXTROSE 50 % IV SOLN
INTRAVENOUS | Status: AC
  Administered 2023-05-07: 50 mL
  Filled 2023-05-07: qty 50

## 2023-05-07 MED ORDER — DEXTROSE 10 % IV SOLN: INTRAVENOUS | Status: DC

## 2023-05-07 MED ORDER — SODIUM CHLORIDE 3 % IN NEBU
4.0000 mL | INHALATION_SOLUTION | Freq: Two times a day (BID) | RESPIRATORY_TRACT | Status: DC
  Administered 2023-05-07 – 2023-05-08 (×2): 4 mL via RESPIRATORY_TRACT
  Filled 2023-05-07 (×3): qty 4

## 2023-05-07 MED ORDER — METOPROLOL TARTRATE 5 MG/5ML IV SOLN
INTRAVENOUS | Status: AC
  Filled 2023-05-07: qty 5

## 2023-05-07 MED ORDER — METHYLPREDNISOLONE SODIUM SUCC 40 MG IJ SOLR
40.0000 mg | Freq: Once | INTRAMUSCULAR | Status: AC
  Administered 2023-05-07: 40 mg via INTRAVENOUS
  Filled 2023-05-07: qty 1

## 2023-05-07 MED ORDER — FUROSEMIDE 10 MG/ML IJ SOLN
60.0000 mg | Freq: Once | INTRAMUSCULAR | Status: AC
  Administered 2023-05-07: 60 mg via INTRAVENOUS
  Filled 2023-05-07: qty 6

## 2023-05-07 MED ORDER — DEXTROSE 50 % IV SOLN
12.5000 g | INTRAVENOUS | Status: AC
  Administered 2023-05-07: 12.5 g via INTRAVENOUS
  Filled 2023-05-07: qty 50

## 2023-05-07 NOTE — Progress Notes (Signed)
Hypoglycemic Event  CBG: 56  Treatment: D50 25 mL (12.5 gm)  Symptoms: None  Follow-up CBG: Time:1204 CBG Result:80  Possible Reasons for Event: Inadequate meal intake    Curtis [name]

## 2023-05-07 NOTE — Progress Notes (Signed)
Hypoglycemic Event  CBG: <10 @   Treatment: D50 50 mL (25 gm)  Symptoms: Shaky  Follow-up CBG: Time:2017 CBG Result:122  Possible Reasons for Event: Inadequate meal intake  Comments/MD notified: Spoke with Britton-Lee, NP about hypoglycemic event.  Order given for D10 @25      Ephriam Turman A

## 2023-05-07 NOTE — Progress Notes (Signed)
Pt HR tachy into the 150s, but reading Afib on bedside monitor, lung sounds rhonchi throughout, unable to cough up secretions.  Desating into high 80s.  Cheryll Cockayne, NP notified + at bedside.  Orders given:  CXR, NT suctioning, 12-lead EKG

## 2023-05-07 NOTE — Progress Notes (Signed)
Nutrition Follow-up  DOCUMENTATION CODES:   Not applicable  INTERVENTION:   -RD will follow for diet advancement and add supplements as appropriate  -D/c Vital 1.2 due to lack of feeding access  NUTRITION DIAGNOSIS:  Inadequate oral intake related to inability to eat (pt sedated and ventilated) as evidenced by NPO status.  Ongoing  GOAL:   Patient will meet greater than or equal to 90% of their needs  Progressing   MONITOR:   Diet advancement  REASON FOR ASSESSMENT:   Ventilator    ASSESSMENT:   68 y/o male with h/o SBO secondary to strangulated inguinal hernia s/p repair 2018, etoh abuse and seizures who is admitted with AMS, aspiration pneumonia, sepsis, AKI and  rhabdomyolysis.  1/17- s/p SBT- extubation held off secondary to poor mentation and significant thick secretions 1/19- rectal tube placed, per GI no signs of GIB 1/21- extubated  Reviewed I/O's: -20 ml x 24 hours and +16.9 L since admission  UOP: 1.8 L x 24 hours  Rectal tube output: 150 ml x 24 hours  Case discussed with RN; pt just just extubated about 30 minutes prior to RD visit. Pt still with some confusion. Pt cooperative during exam, however, did not answer RD questions. RN reports will likely complete bedside swallow later today.  Pt with no feeding access, so will d/c tube feeding orders.   Wt has been stable over the past week.   Palliative care consult pending for goals of care.   Medications reviewed and include colace, folic acid, miralax, MVI, thiamine, precedex, and fentanyl.   Labs reviewed: CBGS: 109-137 (inpatient orders for glycemic control are 0-15 units insulin aspart every 4 hours).    NUTRITION - FOCUSED PHYSICAL EXAM:  Flowsheet Row Most Recent Value  Orbital Region No depletion  Upper Arm Region Mild depletion  Thoracic and Lumbar Region No depletion  Buccal Region No depletion  Temple Region Moderate depletion  Clavicle Bone Region No depletion  Clavicle and  Acromion Bone Region No depletion  Scapular Bone Region No depletion  Dorsal Hand Mild depletion  Patellar Region Moderate depletion  Anterior Thigh Region Moderate depletion  Posterior Calf Region Moderate depletion  Edema (RD Assessment) Mild  Hair Reviewed  Eyes Reviewed  Mouth Reviewed  Skin Reviewed  Nails Reviewed       Diet Order:   Diet Order             Diet NPO time specified  Diet effective now                   EDUCATION NEEDS:   No education needs have been identified at this time  Skin:  Skin Assessment: Reviewed RN Assessment  Last BM:  05/07/23 (type 7 via rectal tube- 100 ml)  Height:   Ht Readings from Last 1 Encounters:  05/03/23 5' 7.01" (1.702 m)    Weight:   Wt Readings from Last 1 Encounters:  05/07/23 81.9 kg    Ideal Body Weight:  67.2 kg  BMI:  Body mass index is 28.27 kg/m.  Estimated Nutritional Needs:   Kcal:  1800-2000  Protein:  100-115 grams  Fluid:  > 1.8 L    Levada Schilling, RD, LDN, CDCES Registered Dietitian III Certified Diabetes Care and Education Specialist If unable to reach this RD, please use "RD Inpatient" group chat on secure chat between hours of 8am-4 pm daily

## 2023-05-07 NOTE — Progress Notes (Signed)
PHARMACY CONSULT NOTE  Pharmacy Consult for Electrolyte Monitoring and Replacement   Recent Labs: Potassium (mmol/L)  Date Value  05/07/2023 4.1  08/23/2013 3.3 (L)   Magnesium (mg/dL)  Date Value  29/56/2130 2.1  08/23/2013 1.4 (L)   Calcium (mg/dL)  Date Value  86/57/8469 8.7 (L)   Calcium, Total (mg/dL)  Date Value  62/95/2841 9.5   Albumin (g/dL)  Date Value  32/44/0102 2.0 (L)   Phosphorus (mg/dL)  Date Value  72/53/6644 4.3  08/23/2013 2.2 (L)   Sodium (mmol/L)  Date Value  05/07/2023 147 (H)  08/23/2013 128 (L)   Assessment: 68 y/o male with h/o SBO secondary to strangulated inguinal hernia s/p repair 2018, etoh abuse and seizures who is admitted with AMS, aspiration pneumonia, sepsis, AKI and rhabdomyolysis. Pharmacy is asked to follow and replace electrolytes while in CCU  Nutrition: Vital AF + FWF 200 mL per tube every 4 hours  Goal of Therapy:  Electrolytes WNL  Plan:  --Na 147, improved will increased free water --Will re-check electrolytes with AM labs tomorrow  Tressie Ellis 05/07/2023 7:55 AM

## 2023-05-07 NOTE — Inpatient Diabetes Management (Signed)
Inpatient Diabetes Program Recommendations  AACE/ADA: New Consensus Statement on Inpatient Glycemic Control   Target Ranges:  Prepandial:   less than 140 mg/dL      Peak postprandial:   less than 180 mg/dL (1-2 hours)      Critically ill patients:  140 - 180 mg/dL    Latest Reference Range & Units 05/07/23 03:23 05/07/23 07:26 05/07/23 11:32 05/07/23 12:04  Glucose-Capillary 70 - 99 mg/dL 161 (H) 096 (H) 56 (L) 80    Latest Reference Range & Units 05/06/23 07:37 05/06/23 11:07 05/06/23 16:02 05/06/23 19:36 05/06/23 23:35  Glucose-Capillary 70 - 99 mg/dL 045 (H) 409 (H) 811 (H) 137 (H) 109 (H)   Review of Glycemic Control  Diabetes history: No Outpatient Diabetes medications: NA Current orders for Inpatient glycemic control: Novolog 0-15 units Q4H  Inpatient Diabetes Program Recommendations:    Insulin: CBG down to 56 mg/dl at 91:47 after getting Novolog 2 units for correction at 8:30 am today. Noted patient extubated today and got last dose of Solumedrol 40 mg at 8:31 am today.  Please decrease Novolog correction to 0-6 units Q4H.  Thanks, Orlando Penner, RN, MSN, CDCES Diabetes Coordinator Inpatient Diabetes Program [phone] (Team Pager from 8am to 5pm)

## 2023-05-07 NOTE — Progress Notes (Signed)
NAME:  Curtis [name], MRN:  [id number], DOB:  December 01, 1955, LOS: 12 ADMISSION DATE:  04/25/2023, CONSULTATION DATE:  04/25/23 CHIEF COMPLAINT:  Severe Hypothermia   Brief Pt Description / Synopsis:  68 year old male patient with a past medical history of incarcerated inguinal hernia s/p repair, SBO, alcohol withdrawal seizures, EtOH abuse who presented to [facility] on 04/24/2022 for unresponsiveness. He was found to be profoundly hypothermic with temperature measuring 77 F. Subsequently was intubated on 04/24/2022. He was actively rewarmed and started on stress dose steroids. Currently normothermic. Course also complicated by rhabdomyolysis with CK peaking at 6000 and currently downtrending. Finally course complicated by acute hypoxic respiratory failure suspecting aspiration for which she is on Zosyn and doxycycline. Tracheal aspirate w/ Moraxella Cat Patient obstacle to extubation is mental status remains agitated with SAT.  History of Present Illness:  le with significant PMH of incarcerated inguinal hernia s/p repair, SBO, alcohol withdrawal seizure, EtOH abuse who presented to the ED with chief complaints of unresponsiveness.   Per ED report, EMS was dispatched to patient's residence by a neighbor who found the patient unconscious.  On EMS arrival, patient was found unresponsive on floor of bedroom. Neighbor report LKNW was 3 days ago. Patient was initially combative and would tense resist any treatment. EMS report they were unable to obtain a temperature or spo2 due to hypothermia and unable to obtain bp due to combative resistance. patient initial cbg registered at 63 treated with 1mg  glucagon repeat reading were in the upper 20's   ED Course: Initial vital signs showed HR of 55 beats/minute, BP 99/47 mm Hg, the RR 22 breaths/minute, and the oxygen saturation 100% on NRB and a temperature of 30F (25C). Patient intubated in for airway protection. Pertinent Labs/Diagnostics Findings: Na+/ K+:131/4.8   Glucose: 286 BUN/Cr.: 31/1.91 CO2 1, Anion Gap 23, AST/ALT:284/83 WBC: 15.7K/L with neutrophil predominance  Lactic acid: >9.0 COVID PCR: Negative,  troponin: 32  CK 5249 ETOH:177 VBG: pO2 46.2; pCO2 72; pH 6.96;  HCO3 16.2, %O2 Sat 46.6.  CXR> CTH> CTA Chest> CT Abd/pelvis>see results below Medication administered in the ON:GEXBMWU given 30 cc/kg of fluids and started on broad-spectrum antibiotics with Ceftriaxone for suspected sepsis with septic shock.   Pertinent  Medical History   Past Medical History:  Diagnosis Date   EtOH dependence (HCC)    History of echocardiogram    a. 04/2023 Echo: EF >55%, mildly reduced RV fxn, triv MR.   Incarcerated inguinal hernia    a. 10/2016 s/p repair.    Micro Data:  1/9: SARS-CoV-2/RSV/influenza PCR>> negative 1/9: HIV screen>> nonreactive 1/9: Blood culture x 2>> no growth 1/9: MRSA PCR>> negative 1/10: Respiratory viral panel>> negative 1/10: Mycoplasma pneumoniae IgM>> less than 770 1/10: Strep pneumo and Legionella urinary antigens>> negative 1/10: Tracheal aspirate>> Proteus mirabilis, Moraxella Catarrhalis (beta lactamase +) 1/11: Acute viral hepatitis panel>> nonreactive 1/13: Blood culture x 2>> no growth  Antimicrobials:   Anti-infectives (From admission, onward)    Start     Dose/Rate Route Frequency Ordered Stop   05/05/23 0900  cefTRIAXone (ROCEPHIN) 2 g in sodium chloride 0.9 % 100 mL IVPB        2 g 200 mL/hr over 30 Minutes Intravenous  Once 05/04/23 1202 05/05/23 1312   05/01/23 1030  cefTRIAXone (ROCEPHIN) 2 g in sodium chloride 0.9 % 100 mL IVPB        2 g 200 mL/hr over 30 Minutes Intravenous Daily 05/01/23 0932 05/04/23 1913   05/01/23 1000  cefTRIAXone (ROCEPHIN)  1 g in sodium chloride 0.9 % 100 mL IVPB  Status:  Discontinued        1 g 200 mL/hr over 30 Minutes Intravenous Every 24 hours 05/01/23 0756 05/01/23 0932   04/27/23 1400  piperacillin-tazobactam (ZOSYN) IVPB 3.375 g  Status:  Discontinued         3.375 g 12.5 mL/hr over 240 Minutes Intravenous Every 8 hours 04/27/23 0744 05/01/23 0756   04/27/23 1000  doxycycline (VIBRAMYCIN) 100 mg in dextrose 5 % 250 mL IVPB        100 mg 125 mL/hr over 120 Minutes Intravenous Every 12 hours 04/26/23 1358 04/29/23 1900   04/26/23 2200  piperacillin-tazobactam (ZOSYN) IVPB 4.5 g  Status:  Discontinued        4.5 g 200 mL/hr over 30 Minutes Intravenous Every 8 hours 04/26/23 1921 04/27/23 0744   04/26/23 1000  azithromycin (ZITHROMAX) 500 mg in sodium chloride 0.9 % 250 mL IVPB  Status:  Discontinued        500 mg 250 mL/hr over 60 Minutes Intravenous Every 24 hours 04/26/23 0401 04/26/23 1357   04/26/23 0600  piperacillin-tazobactam (ZOSYN) IVPB 3.375 g  Status:  Discontinued        3.375 g 12.5 mL/hr over 240 Minutes Intravenous Every 8 hours 04/26/23 0407 04/26/23 1921   04/25/23 1915  cefTRIAXone (ROCEPHIN) 2 g in sodium chloride 0.9 % 100 mL IVPB        2 g 200 mL/hr over 30 Minutes Intravenous  Once 04/25/23 1914 04/25/23 2013       Significant Hospital Events: Including procedures, antibiotic start and stop dates in addition to other pertinent events   04/24/2022 - Intubated, admit to ICU extremely hypotensive.  04/28/2022 - Started on Librium taper and Seroquel BID.  04/29/2022 - Remains with significant agitation on SAT and unable to proceed with extubation.  04/30/2022 Mental status with significant improvement. Following commands. Hgb trending down now at 7.3g/dl. Stools brown. NG tube to suction and no signs of bleeding.  05/01/2022 1pRBC given. Improved mental status.  05/02/2022 Patient H&H stable. Did well on SBT however mentation remains extremley poor with significant thick secretions therefore held off on extubation.  05/03/2022 Remains with poor mental status, not doing well with SBT. My concern is that we are looking at a trach if no improvement in mental status in the next 48hrs.  05/05/2023: Initially tolerated WUA and SBT,  however with BM and turning, agitated and severe hypoxia. 05/06/2023: On minimal vent settings, FAILED SBT.  Increase Seroquel to help with agitation.  Decrease free water flushes to 200 q4h. 05/07/2023: On minimal vent settings, perform SBT as tolerated. Diurese with 60 mg IV Lasix x1 dose.  EXTUBATED.  Interim History / Subjective:  -No significant events noted overnight -Afebrile, hemodynamically stable, not requiring pressors -On minimal vent settings and able to follow simple commands ~will perform SBT -CXR with small pleural effusion, doesn't appear large enough on bedside US to be amendable to thoracentesis -BNP is 864, pt is 16 L positive ~ discussed with Dr. Belia Heman, will give 60 mg IV Lasix x1 dose -Leukocytosis improved to 13.3 from 15.6 -Hgb at 7.2, from 7.5, no reports of bleeding   Objective   Blood pressure (!) 142/61, pulse (!) 53, temperature 99.5 F (37.5 C), temperature source Oral, resp. rate (!) 0, height 5' 7.01" (1.702 m), weight 81.9 kg, SpO2 100%.    Vent Mode: PRVC FiO2 (%):  [50 %] 50 % Set Rate:  [  20 bmp] 20 bmp Vt Set:  [500 mL] 500 mL PEEP:  [8 cmH20] 8 cmH20 Pressure Support:  [5 cmH20] 5 cmH20 Plateau Pressure:  [20 cmH20-21 cmH20] 20 cmH20   Intake/Output Summary (Last 24 hours) at 05/07/2023 1610 Last data filed at 05/07/2023 0600 Gross per 24 hour  Intake 1880.03 ml  Output 1900 ml  Net -19.97 ml   Filed Weights   05/05/23 0400 05/06/23 0344 05/07/23 0500  Weight: 80 kg 75.8 kg 81.9 kg    Examination: General: Acutely ill-appearing male, laying in bed, intubated, awake for SBT, no acute distress HENT: Atraumatic, normocephalic, neck supple, no JVD Lungs: Coarse breath sounds throughout, even, overbreathing the ventilator Cardiovascular: Regular rate and rhythm, S1-S2, no murmurs, rubs, gallops Abdomen: Soft, nontender, nondistended, no guarding, tenderness, bowel sounds positive x 4 Extremities: Normal bulk and tone, no deformities, no edema,  warm and well-perfused Neuro: Awake on precedex, nodding to questions, following all commands, no focal deficits noted, pupils PERRLA GU: Foley catheter in place draining yellow urine  Resolved Hospital Problem list     Assessment & Plan:   #Septic Shock ~ RESOLVED Echocardiogram 04/26/23: LVEF >55%, diastolic function unable to be evaluated, RV systolic function mildly reduced, RV size is normal -Continuous cardiac monitoring -Maintain MAP >65 -Vasopressors as needed to maintain MAP goal ~ not requiring -Trend lactic acid until normalized -Trend HS Troponin until peaked  #Acute Hypoxic Respiratory Failure  #Aspiration Pneumonia (Trach aspirate with Moraxella Ctarrhalis and proteus Mirabilis, gram stain w/ Gram + Cocci  -Full vent support, implement lung protective strategies -Plateau pressures less than 30 cm H20 -Wean FiO2 & PEEP as tolerated to maintain O2 sats >92% -Follow intermittent Chest X-ray & ABG as needed -Spontaneous Breathing Trials when respiratory parameters met and mental status permits -Implement VAP Bundle -Prn Bronchodilators -Completed course of ABX as above -Diuresis as BP and renal function permits ~ discussed with Dr. Belia Heman, will give 60 mg IV Lasix x1 dose on 1/21  #Aspiration Pneumonia ((Trach aspirate with Moraxella Ctarrhalis and proteus Mirabilis, gram stain w/ Gram + Cocci ) ~ TREATED -Monitor fever curve -Trend WBC's  -Follow cultures as above Completed course of ABX as above  #AKI ~IMPROVING #Hypernatremia ~ IMPROVING #Rhabdomyolysis ~ RESOLVED -Monitor I&O's / urinary output -Follow BMP -Ensure adequate renal perfusion -Avoid nephrotoxic agents as able -Replace electrolytes as indicated ~ Pharmacy following for assistance with electrolyte replacement  #Anemia #Thrombocytopenia ~ RESOLVED -Monitor for S/Sx of bleeding -Trend CBC -SCD's for VTE Prophylaxis  -Transfuse for Hgb <7 -CT Abdomen & Pelvis without any sign of hematoma -GI  consulted, appreciate input: No indication for EGD currently, continue PPI BID for SUP, if further drop in Hgb recommends Tagged RBC scan  #Acute Metabolic Encephalopathy #ETOH withdrawal #Sedation needs in setting of mechanical ventilation -Maintain a RASS goal of 0 to -1 -Fentanyl and Precedex to maintain RASS goal -Avoid sedating medications as able -Daily wake up assessment -Completed Librium taper -Continue Thiamine, MVI, folic acid -Continue Seroquel to 50 mg BID    Best Practice (right click and "Reselect all SmartList Selections" daily)   Diet/type: tubefeeds DVT prophylaxis: LMWH GI prophylaxis: PPI Lines: N/A Foley:  Yes, and it is still needed Code Status:  full code Last date of multidisciplinary goals of care discussion [1/21]  1/21: Will update pt's family when they arrive at bedside.  Labs   CBC: Recent Labs  Lab 05/02/23 1428 05/02/23 2033 05/04/23 0400 05/04/23 0900 05/04/23 2359 05/05/23 0400 05/05/23 1637 05/06/23  5176 05/07/23 0445  WBC 9.2   < > 15.0* 14.4*  --  15.2*  --  15.6* 13.3*  NEUTROABS 4.4  --   --   --   --   --   --   --   --   HGB 6.6*   < > 7.5* 7.1* 7.0* 6.8* 7.8* 7.5* 7.2*  HCT 20.9*   < > 23.9* 22.7* 22.5* 21.7* 24.4* 23.9* 22.9*  MCV 106.1*   < > 107.7* 106.6*  --  105.9*  --  105.3* 103.6*  PLT 114*   < > 195 199  --  208  --  205 193   < > = values in this interval not displayed.    Basic Metabolic Panel: Recent Labs  Lab 05/01/23 0335 05/01/23 1252 05/02/23 0405 05/03/23 0409 05/04/23 0400 05/04/23 2011 05/05/23 0400 05/06/23 0428 05/07/23 0445  NA 144  --  143 147* 151* 150* 151* 149* 147*  K 3.1*   < > 3.6 4.1 4.5 4.2 4.1 4.0 4.1  CL 103  --  109 113* 120* 118* 120* 119* 116*  CO2 26  --  22 21* 23 23 23 22  21*  GLUCOSE 112*  --  137* 122* 142* 143* 140* 132* 146*  BUN 82*  --  92* 92* 90* 80* 78* 66* 60*  CREATININE 2.82*  --  2.98* 2.90* 2.53* 2.36* 2.23* 1.97* 1.84*  CALCIUM 8.1*  --  8.5* 9.0 9.2 8.9  8.7* 8.3* 8.7*  MG 2.2  --  2.2 2.3 2.4  --   --  2.1  --   PHOS 3.5  --  3.6 3.3 3.2  --   --   --  4.3   < > = values in this interval not displayed.   GFR: Estimated Creatinine Clearance: 38.8 mL/min (A) (by C-G formula based on SCr of 1.84 mg/dL (H)). Recent Labs  Lab 05/01/23 0335 05/02/23 0405 05/02/23 1300 05/03/23 0409 05/04/23 0400 05/04/23 0900 05/05/23 0400 05/06/23 0428 05/07/23 0445  PROCALCITON 2.85 1.97  --  1.17  --   --   --   --   --   WBC 6.3 7.7   < > 12.0*   < > 14.4* 15.2* 15.6* 13.3*   < > = values in this interval not displayed.    Liver Function Tests: Recent Labs  Lab 05/01/23 0335 05/02/23 1428 05/07/23 0445  AST  --  26  --   ALT  --  41  --   ALKPHOS  --  72  --   BILITOT  --  1.1  --   PROT  --  5.5*  --   ALBUMIN 2.3* 2.1* 2.0*   No results for input(s): "LIPASE", "AMYLASE" in the last 168 hours. No results for input(s): "AMMONIA" in the last 168 hours.  ABG    Component Value Date/Time   PHART 7.39 04/28/2023 0827   PCO2ART 45 04/28/2023 0827   PO2ART 85 04/28/2023 0827   HCO3 27.2 04/28/2023 0827   ACIDBASEDEF 8.8 (H) 04/26/2023 0500   O2SAT 96.8 04/28/2023 0827     Coagulation Profile: No results for input(s): "INR", "PROTIME" in the last 168 hours.  Cardiac Enzymes: No results for input(s): "CKTOTAL", "CKMB", "CKMBINDEX", "TROPONINI" in the last 168 hours.  HbA1C: Hgb A1c MFr Bld  Date/Time Value Ref Range Status  04/27/2023 04:24 AM 5.1 4.8 - 5.6 % Final    Comment:    (NOTE) Pre diabetes:  5.7%-6.4%  Diabetes:              >6.4%  Glycemic control for   <7.0% adults with diabetes   04/26/2023 03:25 AM 5.1 4.8 - 5.6 % Final    Comment:    (NOTE) Pre diabetes:          5.7%-6.4%  Diabetes:              >6.4%  Glycemic control for   <7.0% adults with diabetes     CBG: Recent Labs  Lab 05/06/23 1602 05/06/23 1936 05/06/23 2335 05/07/23 0323 05/07/23 0726  GLUCAP 115* 137* 109* 119* 133*     Review of Systems:   Unable to assess due to AMS/Intubation/sedation    Past Medical History:  He,  has a past medical history of EtOH dependence (HCC), History of echocardiogram, and Incarcerated inguinal hernia.   Surgical History:   Past Surgical History:  Procedure Laterality Date   INGUINAL HERNIA REPAIR Right 11/01/2016   Procedure: HERNIA REPAIR INGUINAL INCARCERATED;  Surgeon: Leafy Ro, MD;  Location: [facility] ORS;  Service: General;  Laterality: Right;   NO PAST SURGERIES       Social History:   reports that he has been smoking cigarettes. His smokeless tobacco use includes chew. He reports current alcohol use of about 126.0 standard drinks of alcohol per week. He reports that he does not use drugs.   Family History:  His Family history is unknown by patient.   Allergies No Known Allergies   Home Medications  Prior to Admission medications   Not on File     Critical care time: 40 minutes     Harlon Ditty, AGACNP-BC [facility] Prefer epic messenger for cross cover needs If after hours, please call E-link

## 2023-05-08 ENCOUNTER — Inpatient Hospital Stay: Payer: 59

## 2023-05-08 ENCOUNTER — Other Ambulatory Visit: Payer: Self-pay

## 2023-05-08 DIAGNOSIS — E870 Hyperosmolality and hypernatremia: Secondary | ICD-10-CM | POA: Diagnosis not present

## 2023-05-08 DIAGNOSIS — Z7189 Other specified counseling: Secondary | ICD-10-CM

## 2023-05-08 DIAGNOSIS — J9601 Acute respiratory failure with hypoxia: Secondary | ICD-10-CM | POA: Diagnosis not present

## 2023-05-08 DIAGNOSIS — J189 Pneumonia, unspecified organism: Secondary | ICD-10-CM | POA: Diagnosis not present

## 2023-05-08 DIAGNOSIS — D649 Anemia, unspecified: Secondary | ICD-10-CM

## 2023-05-08 DIAGNOSIS — G9341 Metabolic encephalopathy: Secondary | ICD-10-CM | POA: Diagnosis not present

## 2023-05-08 LAB — BLOOD GAS, ARTERIAL
Acid-Base Excess: 0.1 mmol/L (ref 0.0–2.0)
Bicarbonate: 24.7 mmol/L (ref 20.0–28.0)
FIO2: 55 %
MECHVT: 500 mL
Mechanical Rate: 15
O2 Saturation: 99.6 %
PEEP: 5 cmH2O
Patient temperature: 37
pCO2 arterial: 39 mm[Hg] (ref 32–48)
pH, Arterial: 7.41 (ref 7.35–7.45)
pO2, Arterial: 94 mm[Hg] (ref 83–108)

## 2023-05-08 LAB — CBC
HCT: 25.6 % — ABNORMAL LOW (ref 39.0–52.0)
Hemoglobin: 7.8 g/dL — ABNORMAL LOW (ref 13.0–17.0)
MCH: 33.2 pg (ref 26.0–34.0)
MCHC: 30.5 g/dL (ref 30.0–36.0)
MCV: 108.9 fL — ABNORMAL HIGH (ref 80.0–100.0)
Platelets: 256 10*3/uL (ref 150–400)
RBC: 2.35 MIL/uL — ABNORMAL LOW (ref 4.22–5.81)
RDW: 16.5 % — ABNORMAL HIGH (ref 11.5–15.5)
WBC: 21.5 10*3/uL — ABNORMAL HIGH (ref 4.0–10.5)
nRBC: 0 % (ref 0.0–0.2)

## 2023-05-08 LAB — LACTIC ACID, PLASMA: Lactic Acid, Venous: 0.9 mmol/L (ref 0.5–1.9)

## 2023-05-08 LAB — TROPONIN I (HIGH SENSITIVITY)
Troponin I (High Sensitivity): 885 ng/L (ref <18)
Troponin I (High Sensitivity): 693 ng/L (ref <18)
Troponin I (High Sensitivity): 529 ng/L (ref <18)

## 2023-05-08 LAB — RESPIRATORY PANEL BY PCR

## 2023-05-08 LAB — BLOOD GAS, VENOUS
Acid-base deficit: 0.6 mmol/L (ref 0.0–2.0)
Bicarbonate: 24.9 mmol/L (ref 20.0–28.0)
O2 Saturation: 79.3 %
Patient temperature: 37
pCO2, Ven: 43 mm[Hg] — ABNORMAL LOW (ref 44–60)
pH, Ven: 7.37 (ref 7.25–7.43)
pO2, Ven: 47 mm[Hg] — ABNORMAL HIGH (ref 32–45)

## 2023-05-08 LAB — GLUCOSE, CAPILLARY
Glucose-Capillary: 94 mg/dL (ref 70–99)
Glucose-Capillary: 108 mg/dL — ABNORMAL HIGH (ref 70–99)
Glucose-Capillary: 113 mg/dL — ABNORMAL HIGH (ref 70–99)
Glucose-Capillary: 153 mg/dL — ABNORMAL HIGH (ref 70–99)
Glucose-Capillary: 138 mg/dL — ABNORMAL HIGH (ref 70–99)
Glucose-Capillary: 117 mg/dL — ABNORMAL HIGH (ref 70–99)

## 2023-05-08 LAB — PROCALCITONIN: Procalcitonin: 0.35 ng/mL

## 2023-05-08 LAB — POTASSIUM: Potassium: 4.4 mmol/L (ref 3.5–5.1)

## 2023-05-08 LAB — MAGNESIUM
Magnesium: 2 mg/dL (ref 1.7–2.4)
Magnesium: 1.9 mg/dL (ref 1.7–2.4)

## 2023-05-08 LAB — PHOSPHORUS: Phosphorus: 5.1 mg/dL — ABNORMAL HIGH (ref 2.5–4.6)

## 2023-05-08 MED ORDER — MIDAZOLAM HCL 2 MG/2ML IJ SOLN
2.0000 mg | INTRAMUSCULAR | Status: DC | PRN
  Administered 2023-05-08: 4 mg via INTRAVENOUS
  Administered 2023-05-08 – 2023-05-13 (×22): 2 mg via INTRAVENOUS
  Administered 2023-05-13: 4 mg via INTRAVENOUS
  Administered 2023-05-13: 2 mg via INTRAVENOUS
  Administered 2023-05-13 (×2): 4 mg via INTRAVENOUS
  Filled 2023-05-08 (×2): qty 2
  Filled 2023-05-08: qty 4
  Filled 2023-05-08 (×5): qty 2
  Filled 2023-05-08: qty 4
  Filled 2023-05-08 (×14): qty 2
  Filled 2023-05-08: qty 4
  Filled 2023-05-08 (×5): qty 2
  Filled 2023-05-08: qty 4
  Filled 2023-05-08 (×2): qty 2

## 2023-05-08 MED ORDER — MIDAZOLAM HCL 2 MG/2ML IJ SOLN: 4.0000 mg | Freq: Once | INTRAMUSCULAR | Status: AC

## 2023-05-08 MED ORDER — MORPHINE SULFATE (PF) 2 MG/ML IV SOLN
2.0000 mg | Freq: Once | INTRAVENOUS | Status: AC
  Administered 2023-05-08: 2 mg via INTRAVENOUS
  Filled 2023-05-08: qty 1

## 2023-05-08 MED ORDER — ACETAMINOPHEN 650 MG RE SUPP
650.0000 mg | Freq: Four times a day (QID) | RECTAL | Status: DC | PRN
  Filled 2023-05-08: qty 1

## 2023-05-08 MED ORDER — ETOMIDATE 2 MG/ML IV SOLN: 20.0000 mg | Freq: Once | INTRAVENOUS | Status: AC

## 2023-05-08 MED ORDER — FREE WATER: 30.0000 mL | Status: DC

## 2023-05-08 MED ORDER — FUROSEMIDE 10 MG/ML IJ SOLN
40.0000 mg | Freq: Once | INTRAMUSCULAR | Status: AC
  Administered 2023-05-08: 40 mg via INTRAVENOUS

## 2023-05-08 MED ORDER — MIDAZOLAM HCL 2 MG/2ML IJ SOLN
INTRAMUSCULAR | Status: AC
  Administered 2023-05-08: 4 mg via INTRAVENOUS
  Filled 2023-05-08: qty 4

## 2023-05-08 MED ORDER — PHENYLEPHRINE CONCENTRATED 100MG/250ML (0.4 MG/ML) INFUSION SIMPLE
0.0000 ug/min | INTRAVENOUS | Status: DC
  Filled 2023-05-08: qty 250

## 2023-05-08 MED ORDER — ACETAMINOPHEN 325 MG PO TABS
650.0000 mg | ORAL_TABLET | Freq: Four times a day (QID) | ORAL | Status: DC | PRN
  Administered 2023-05-08: 650 mg
  Filled 2023-05-08: qty 2

## 2023-05-08 MED ORDER — NOREPINEPHRINE 16 MG/250ML-% IV SOLN
0.0000 ug/min | INTRAVENOUS | Status: DC
  Filled 2023-05-08: qty 250

## 2023-05-08 MED ORDER — SODIUM CHLORIDE 3 % IN NEBU
4.0000 mL | INHALATION_SOLUTION | Freq: Two times a day (BID) | RESPIRATORY_TRACT | Status: AC
  Administered 2023-05-08: 4 mL via RESPIRATORY_TRACT
  Filled 2023-05-08: qty 4

## 2023-05-08 MED ORDER — ETOMIDATE 2 MG/ML IV SOLN
INTRAVENOUS | Status: AC
  Administered 2023-05-08: 20 mg via INTRAVENOUS
  Filled 2023-05-08: qty 10

## 2023-05-08 MED ORDER — DEXMEDETOMIDINE HCL IN NACL 400 MCG/100ML IV SOLN: 0.0000 ug/kg/h | INTRAVENOUS | Status: DC

## 2023-05-08 MED ORDER — DEXTROSE 5 % IV SOLN: INTRAVENOUS | Status: AC

## 2023-05-08 MED ORDER — FENTANYL 2500MCG IN NS 250ML (10MCG/ML) PREMIX INFUSION
25.0000 ug/h | INTRAVENOUS | Status: DC
  Administered 2023-05-08: 25 ug/h via INTRAVENOUS
  Filled 2023-05-08 (×4): qty 250

## 2023-05-08 MED ORDER — SODIUM CHLORIDE 3 % IN NEBU
4.0000 mL | INHALATION_SOLUTION | Freq: Once | RESPIRATORY_TRACT | Status: AC
  Administered 2023-05-08: 4 mL via RESPIRATORY_TRACT

## 2023-05-08 MED ORDER — IPRATROPIUM-ALBUTEROL 0.5-2.5 (3) MG/3ML IN SOLN
3.0000 mL | Freq: Four times a day (QID) | RESPIRATORY_TRACT | Status: DC
  Administered 2023-05-08 – 2023-05-10 (×10): 3 mL via RESPIRATORY_TRACT
  Filled 2023-05-08 (×10): qty 3

## 2023-05-08 MED ORDER — METOPROLOL TARTRATE 5 MG/5ML IV SOLN
2.5000 mg | Freq: Four times a day (QID) | INTRAVENOUS | Status: DC | PRN
  Administered 2023-05-08: 2.5 mg via INTRAVENOUS
  Administered 2023-05-08 – 2023-05-18 (×7): 5 mg via INTRAVENOUS
  Filled 2023-05-08 (×8): qty 5

## 2023-05-08 MED ORDER — PROPOFOL 1000 MG/100ML IV EMUL
0.0000 ug/kg/min | INTRAVENOUS | Status: DC
  Administered 2023-05-08: 50 ug/kg/min via INTRAVENOUS
  Administered 2023-05-08: 5 ug/kg/min via INTRAVENOUS
  Administered 2023-05-08: 50 ug/kg/min via INTRAVENOUS
  Filled 2023-05-08 (×4): qty 100

## 2023-05-08 MED ORDER — ROCURONIUM BROMIDE 10 MG/ML (PF) SYRINGE: 75.0000 mg | PREFILLED_SYRINGE | Freq: Once | INTRAVENOUS | Status: AC

## 2023-05-08 MED ORDER — FREE WATER
100.0000 mL | Status: DC
Start: 2023-05-08 — End: 2023-05-09
  Administered 2023-05-08 – 2023-05-09 (×3): 100 mL

## 2023-05-08 MED ORDER — DEXMEDETOMIDINE HCL IN NACL 400 MCG/100ML IV SOLN
0.0000 ug/kg/h | INTRAVENOUS | Status: DC
Start: 2023-05-08 — End: 2023-05-09
  Administered 2023-05-08: 0.4 ug/kg/h via INTRAVENOUS
  Filled 2023-05-08 (×3): qty 100

## 2023-05-08 MED ORDER — LACTATED RINGERS IV BOLUS
500.0000 mL | Freq: Once | INTRAVENOUS | Status: AC
  Administered 2023-05-08: 500 mL via INTRAVENOUS

## 2023-05-08 MED ORDER — METHYLPREDNISOLONE SODIUM SUCC 40 MG IJ SOLR
40.0000 mg | Freq: Every day | INTRAMUSCULAR | Status: DC
  Administered 2023-05-08 – 2023-05-13 (×6): 40 mg via INTRAVENOUS
  Filled 2023-05-08 (×6): qty 1

## 2023-05-08 MED ORDER — FUROSEMIDE 10 MG/ML IJ SOLN
INTRAMUSCULAR | Status: AC
  Filled 2023-05-08: qty 4

## 2023-05-08 MED ORDER — DEXMEDETOMIDINE HCL IN NACL 400 MCG/100ML IV SOLN
INTRAVENOUS | Status: AC
  Administered 2023-05-08: 0.4 ug/kg/h via INTRAVENOUS
  Filled 2023-05-08: qty 100

## 2023-05-08 MED ORDER — METOPROLOL TARTRATE 5 MG/5ML IV SOLN
INTRAVENOUS | Status: AC
  Filled 2023-05-08: qty 5

## 2023-05-08 MED ORDER — SODIUM CHLORIDE 0.9 % IV SOLN
3.0000 g | Freq: Four times a day (QID) | INTRAVENOUS | Status: DC
  Filled 2023-05-08 (×2): qty 8

## 2023-05-08 MED ORDER — VITAL AF 1.2 CAL PO LIQD
1000.0000 mL | ORAL | Status: DC
  Administered 2023-05-08 – 2023-05-12 (×6): 1000 mL

## 2023-05-08 MED ORDER — DOCUSATE SODIUM 50 MG/5ML PO LIQD
100.0000 mg | Freq: Two times a day (BID) | ORAL | Status: DC
  Administered 2023-05-08 – 2023-05-13 (×6): 100 mg
  Filled 2023-05-08 (×7): qty 10

## 2023-05-08 MED ORDER — PIPERACILLIN-TAZOBACTAM 3.375 G IVPB
3.3750 g | Freq: Three times a day (TID) | INTRAVENOUS | Status: DC
Start: 2023-05-08 — End: 2023-05-14
  Administered 2023-05-08 – 2023-05-14 (×18): 3.375 g via INTRAVENOUS
  Filled 2023-05-08 (×18): qty 50

## 2023-05-08 MED ORDER — FUROSEMIDE 10 MG/ML IJ SOLN
40.0000 mg | Freq: Once | INTRAMUSCULAR | Status: AC
  Administered 2023-05-08: 40 mg via INTRAVENOUS
  Filled 2023-05-08: qty 4

## 2023-05-08 MED ORDER — POLYETHYLENE GLYCOL 3350 17 G PO PACK
17.0000 g | PACK | Freq: Every day | ORAL | Status: DC
  Administered 2023-05-10 – 2023-05-13 (×2): 17 g
  Filled 2023-05-08 (×2): qty 1

## 2023-05-08 MED ORDER — FENTANYL BOLUS VIA INFUSION
25.0000 ug | INTRAVENOUS | Status: DC | PRN
Start: 2023-05-08 — End: 2023-05-10
  Administered 2023-05-08 – 2023-05-09 (×8): 100 ug via INTRAVENOUS
  Administered 2023-05-10: 75 ug via INTRAVENOUS
  Administered 2023-05-10: 100 ug via INTRAVENOUS

## 2023-05-08 MED ORDER — FENTANYL CITRATE PF 50 MCG/ML IJ SOSY
25.0000 ug | PREFILLED_SYRINGE | Freq: Once | INTRAMUSCULAR | Status: AC
Start: 2023-05-08 — End: 2023-05-08
  Administered 2023-05-08: 25 ug via INTRAVENOUS
  Filled 2023-05-08: qty 1

## 2023-05-08 MED ORDER — CLONIDINE HCL 0.1 MG/24HR TD PTWK
0.1000 mg | MEDICATED_PATCH | TRANSDERMAL | Status: DC
  Filled 2023-05-08: qty 1

## 2023-05-08 MED ORDER — ROCURONIUM BROMIDE 10 MG/ML (PF) SYRINGE
PREFILLED_SYRINGE | INTRAVENOUS | Status: AC
  Administered 2023-05-08: 75 mg via INTRAVENOUS
  Filled 2023-05-08: qty 10

## 2023-05-08 MED ORDER — ALBUTEROL SULFATE (2.5 MG/3ML) 0.083% IN NEBU
2.5000 mg | INHALATION_SOLUTION | Freq: Once | RESPIRATORY_TRACT | Status: AC
  Administered 2023-05-08: 2.5 mg via RESPIRATORY_TRACT
  Filled 2023-05-08: qty 3

## 2023-05-08 MED ORDER — METOPROLOL TARTRATE 5 MG/5ML IV SOLN
5.0000 mg | Freq: Once | INTRAVENOUS | Status: AC
  Administered 2023-05-08: 5 mg via INTRAVENOUS

## 2023-05-08 MED ORDER — GLYCOPYRROLATE 0.2 MG/ML IJ SOLN
0.1000 mg | Freq: Three times a day (TID) | INTRAMUSCULAR | Status: DC
  Administered 2023-05-08 (×2): 0.1 mg via INTRAVENOUS
  Filled 2023-05-08 (×2): qty 1

## 2023-05-08 NOTE — Plan of Care (Deleted)
Consult noted for goals of care.  Patient is currently resting in bed on ventilator support.  No family at bedside.  Will need to reach out to patient's family to discuss care moving forward.

## 2023-05-08 NOTE — Progress Notes (Signed)
SLP Cancellation Note  Patient Details Name: Curtis [name] MRN: [id number] DOB: 29-Jun-1955   Cancelled treatment:       Reason Eval/Treat Not Completed: Medical issues which prohibited therapy;Patient not medically ready (chart reviewed; consulted NSG staff re: pt's current status this morning.)  Per chart notes, pt required emergent placement of an artificial airway secondary to acute hypoxic respiratory failure; pt orally intubated.  ST services can be available for assessment per MD order when pt is medically appropriate.      Jerilynn Som, MS, CCC-SLP Speech Language Pathologist Rehab Services; [facility] [phone] (ascom) Ashwini Jago 05/08/2023, 9:29 AM

## 2023-05-08 NOTE — Progress Notes (Signed)
Patient with BP 77/48. Order obtained for levophed drip and LR bolus.  Patient with sustained heart rate 150-160's after metoprolol 5mg  IV.

## 2023-05-08 NOTE — Progress Notes (Addendum)
PHARMACY CONSULT NOTE  Pharmacy Consult for Electrolyte Monitoring and Replacement   Recent Labs: Potassium (mmol/L)  Date Value  05/08/2023 4.0  08/23/2013 3.3 (L)   Magnesium (mg/dL)  Date Value  02/10/2535 2.0  08/23/2013 1.4 (L)   Calcium (mg/dL)  Date Value  64/40/3474 9.2   Calcium, Total (mg/dL)  Date Value  25/95/6387 9.5   Albumin (g/dL)  Date Value  56/43/3295 2.5 (L)   Phosphorus (mg/dL)  Date Value  18/84/1660 PENDING  08/23/2013 2.2 (L)   Sodium (mmol/L)  Date Value  05/08/2023 151 (H)  08/23/2013 128 (L)   Assessment: 68 y/o male with h/o SBO secondary to strangulated inguinal hernia s/p repair 2018, etoh abuse and seizures who is admitted with AMS, aspiration pneumonia, sepsis, AKI and rhabdomyolysis. Pharmacy is asked to follow and replace electrolytes while in CCU  Goal of Therapy:  Electrolytes WNL  Plan:  --Na 151, likely secondary to poor PO intake after extubation and diuresis. Continue D5w --Will re-check electrolytes with AM labs tomorrow  Tressie Ellis 05/08/2023 8:01 AM

## 2023-05-08 NOTE — Progress Notes (Signed)
Patient intubated with size 7.5ETT, 25 at the lip by Zada Girt and assisted by cardiopulmonary.  Condensation noted in tube, positive color change on c02 detector, equal bilateral breath sounds. Chest x-ray pending.  OG tube place by Zada Girt with direct visualization of glideoscope. KUB pending for placement verification.

## 2023-05-08 NOTE — Progress Notes (Signed)
OT Cancellation Note  Patient Details Name: Curtis [name] MRN: [id number] DOB: [birth year]   Cancelled Treatment:    Reason Eval/Treat Not Completed: Other (comment)Pt has been intubated since evaluation orders received. OT will sign off. Please re-consult when pt is medically appropriate.  Oleta Mouse, OTD OTR/L  05/08/23, 10:08 AM

## 2023-05-08 NOTE — Consult Note (Signed)
Consultation Note Date: 05/08/2023   Patient Name: Curtis [name]  DOB: 01-Dec-1955  MRN: [id number]  Age / Sex: 68 y.o., male  PCP: Patient, No Pcp Per Referring Physician: Erin Fulling, MD  Reason for Consultation: Establishing goals of care  HPI/Patient Profile: 68 year old male patient with a past medical history of incarcerated inguinal hernia s/p repair, SBO, alcohol withdrawal seizures, EtOH abuse who presented to [facility] on 04/24/2022 for unresponsiveness. He was found to be profoundly hypothermic with temperature measuring 77 F. Subsequently was intubated on 04/24/2022. He was actively rewarmed and started on stress dose steroids. Currently normothermic. Course also complicated by rhabdomyolysis with CK peaking at 6000 and currently downtrending. Finally course complicated by acute hypoxic respiratory failure suspecting aspiration for which she is on Zosyn and doxycycline. Tracheal aspirate w/ Moraxella Cat Patient obstacle to extubation is mental status remains agitated with SAT.  Patient was extubated yesterday and subsequently reintubated today.  Clinical Assessment and Goals of Care: Notes and labs reviewed.  Spoke with CCM regarding his status.  Discussed his extubation and reintubation.  Into see patient.  He is currently resting in bed on ventilator, no family at bedside.  Called to speak with patient's daughter.  She states patient is not married and she is an only child.  She states she has been battling bronchitis herself and has not been well.  Daughter states that she has been kept well updated by CCM regarding her father's status.  She inquires about decisions that will need to be made, including a decision on tracheostomy if patient is unable to wean from ventilator support.  Discussed meeting tomorrow at bedside to speak further about plans moving forward.    SUMMARY OF RECOMMENDATIONS   Meet  with daughter tomorrow bedside.       Primary Diagnoses: Present on Admission:  Acute metabolic encephalopathy   I have reviewed the medical record, interviewed the patient and family, and examined the patient. The following aspects are pertinent.  Past Medical History:  Diagnosis Date   EtOH dependence (HCC)    History of echocardiogram    a. 04/2023 Echo: EF >55%, mildly reduced RV fxn, triv MR.   Incarcerated inguinal hernia    a. 10/2016 s/p repair.   Social History   Socioeconomic History   Marital status: Single    Spouse name: Not on file   Number of children: Not on file   Years of education: Not on file   Highest education level: Not on file  Occupational History   Not on file  Tobacco Use   Smoking status: Every Day    Current packs/day: 1.00    Types: Cigarettes   Smokeless tobacco: Current    Types: Chew  Vaping Use   Vaping status: Never Used  Substance and Sexual Activity   Alcohol use: Yes    Alcohol/week: 126.0 standard drinks of alcohol    Types: 126 Cans of beer per week    Comment: pt repors he drinks 5 or 6 beers a day.  Drug use: No   Sexual activity: Not on file  Other Topics Concern   Not on file  Social History Narrative   Not on file   Social Drivers of Health   Financial Resource Strain: Not on file  Food Insecurity: Not on file  Transportation Needs: Not on file  Physical Activity: Not on file  Stress: Not on file  Social Connections: Not on file   Family History  Family history unknown: Yes   Scheduled Meds:  sodium chloride HYPERTONIC  4 mL Nebulization BID   And   albuterol  2.5 mg Nebulization Once   Chlorhexidine Gluconate Cloth  6 each Topical Nightly   docusate  100 mg Per Tube BID   enoxaparin (LOVENOX) injection  40 mg Subcutaneous Q24H   folic acid  1 mg Per Tube Daily   furosemide  40 mg Intravenous Once   glycopyrrolate  0.1 mg Intravenous TID   ipratropium-albuterol  3 mL Nebulization Q6H    methylPREDNISolone (SOLU-MEDROL) injection  40 mg Intravenous Daily   multivitamin with minerals  1 tablet Per Tube Daily   pantoprazole (PROTONIX) IV  40 mg Intravenous Q12H   polyethylene glycol  17 g Per Tube Daily   thiamine  100 mg Per Tube Daily   Continuous Infusions:  dextrose 40 mL/hr at 05/08/23 1200   fentaNYL infusion INTRAVENOUS 100 mcg/hr (05/08/23 1200)   norepinephrine (LEVOPHED) Adult infusion     piperacillin-tazobactam (ZOSYN)  IV 3.375 g (05/08/23 1353)   propofol (DIPRIVAN) infusion 50 mcg/kg/min (05/08/23 1337)   PRN Meds:.acetaminophen, acetaminophen, docusate, fentaNYL, haloperidol lactate, metoprolol tartrate, midazolam, polyethylene glycol Medications Prior to Admission:  Prior to Admission medications   Not on File   No Known Allergies Review of Systems  Unable to perform ROS   Physical Exam Constitutional:      Comments: Eyes closed  Pulmonary:     Comments: On ventilator support    Vital Signs: BP (!) 100/43   Pulse 82   Temp 100.3 F (37.9 C) (Oral)   Resp 15   Ht 5' 7.01" (1.702 m)   Wt 76.2 kg   SpO2 96%   BMI 26.30 kg/m  Pain Scale: 0-10   Pain Score: 0-No pain   SpO2: SpO2: 96 % O2 Device:SpO2: 96 % O2 Flow Rate: .O2 Flow Rate (L/min): 15 L/min  IO: Intake/output summary:  Intake/Output Summary (Last 24 hours) at 05/08/2023 1507 Last data filed at 05/08/2023 1200 Gross per 24 hour  Intake 1087.83 ml  Output 3930 ml  Net -2842.17 ml    LBM: Last BM Date : 05/08/23 Baseline Weight: Weight: 79 kg Most recent weight: Weight: 76.2 kg        Signed by: Morton Stall, NP   Please contact Palliative Medicine Team phone at [phone] for questions and concerns.  For individual provider: See Loretha Stapler

## 2023-05-08 NOTE — Progress Notes (Signed)
Tylenol 650 mg given via OG for oral temperature of 101.5

## 2023-05-08 NOTE — Progress Notes (Signed)
Patient with HR 140-150's, respiratory rate 40-45, agitated and restless, copious secretions, bilateral rhonchi, increased work of breathing.  Zada Girt, NP notified.  Attempted to deep suction to clear secretions with no success. Order obtained to start precedex.

## 2023-05-08 NOTE — Plan of Care (Signed)
  Problem: Education: Goal: Knowledge of General Education information will improve Description Including pain rating scale, medication(s)/side effects and non-pharmacologic comfort measures Outcome: Not Progressing   Problem: Clinical Measurements: Goal: Ability to maintain clinical measurements within normal limits will improve Outcome: Not Progressing Goal: Will remain free from infection Outcome: Not Progressing Goal: Diagnostic test results will improve Outcome: Not Progressing

## 2023-05-08 NOTE — Progress Notes (Addendum)
NAME:  Curtis [name], MRN:  [id number], DOB:  August 09, 1955, LOS: 13 ADMISSION DATE:  04/25/2023, CONSULTATION DATE:  04/25/23 CHIEF COMPLAINT:  Severe Hypothermia   Brief Pt Description / Synopsis:  68 year old male patient with a past medical history of incarcerated inguinal hernia s/p repair, SBO, alcohol withdrawal seizures, EtOH abuse who presented to [facility] on 04/24/2022 for unresponsiveness. He was found to be profoundly hypothermic with temperature measuring 77 F. Subsequently was intubated on 04/24/2022. He was actively rewarmed and started on stress dose steroids. Currently normothermic. Course also complicated by rhabdomyolysis with CK peaking at 6000 and currently downtrending. Finally course complicated by acute hypoxic respiratory failure suspecting aspiration for which she is on Zosyn and doxycycline. Tracheal aspirate w/ Moraxella Cat Patient obstacle to extubation is mental status remains agitated with SAT.  History of Present Illness:  le with significant PMH of incarcerated inguinal hernia s/p repair, SBO, alcohol withdrawal seizure, EtOH abuse who presented to the ED with chief complaints of unresponsiveness.   Per ED report, EMS was dispatched to patient's residence by a neighbor who found the patient unconscious.  On EMS arrival, patient was found unresponsive on floor of bedroom. Neighbor report LKNW was 3 days ago. Patient was initially combative and would tense resist any treatment. EMS report they were unable to obtain a temperature or spo2 due to hypothermia and unable to obtain bp due to combative resistance. patient initial cbg registered at 34 treated with 1mg  glucagon repeat reading were in the upper 20's   ED Course: Initial vital signs showed HR of 55 beats/minute, BP 99/47 mm Hg, the RR 22 breaths/minute, and the oxygen saturation 100% on NRB and a temperature of 66F (25C). Patient intubated in for airway protection. Pertinent Labs/Diagnostics Findings: Na+/ K+:131/4.8   Glucose: 286 BUN/Cr.: 31/1.91 CO2 1, Anion Gap 23, AST/ALT:284/83 WBC: 15.7K/L with neutrophil predominance  Lactic acid: >9.0 COVID PCR: Negative,  troponin: 32  CK 5249 ETOH:177 VBG: pO2 46.2; pCO2 72; pH 6.96;  HCO3 16.2, %O2 Sat 46.6.  CXR> CTH> CTA Chest> CT Abd/pelvis>see results below Medication administered in the IO:NGEXBMW given 30 cc/kg of fluids and started on broad-spectrum antibiotics with Ceftriaxone for suspected sepsis with septic shock.   Pertinent  Medical History   Past Medical History:  Diagnosis Date   EtOH dependence (HCC)    History of echocardiogram    a. 04/2023 Echo: EF >55%, mildly reduced RV fxn, triv MR.   Incarcerated inguinal hernia    a. 10/2016 s/p repair.    Micro Data:  1/9: SARS-CoV-2/RSV/influenza PCR>> negative 1/9: HIV screen>> nonreactive 1/9: Blood culture x 2>> no growth 1/9: MRSA PCR>> negative 1/10: Respiratory viral panel>> negative 1/10: Mycoplasma pneumoniae IgM>> less than 770 1/10: Strep pneumo and Legionella urinary antigens>> negative 1/10: Tracheal aspirate>> Proteus mirabilis, Moraxella Catarrhalis (beta lactamase +) 1/11: Acute viral hepatitis panel>> nonreactive 1/13: Blood culture x 2>> no growth  Antimicrobials:   Anti-infectives (From admission, onward)    Start     Dose/Rate Route Frequency Ordered Stop   05/08/23 1000  Ampicillin-Sulbactam (UNASYN) 3 g in sodium chloride 0.9 % 100 mL IVPB        3 g 200 mL/hr over 30 Minutes Intravenous Every 6 hours 05/08/23 0803     05/05/23 0900  cefTRIAXone (ROCEPHIN) 2 g in sodium chloride 0.9 % 100 mL IVPB        2 g 200 mL/hr over 30 Minutes Intravenous  Once 05/04/23 1202 05/05/23 1312   05/01/23 1030  cefTRIAXone (ROCEPHIN) 2 g in sodium chloride 0.9 % 100 mL IVPB        2 g 200 mL/hr over 30 Minutes Intravenous Daily 05/01/23 0932 05/04/23 1913   05/01/23 1000  cefTRIAXone (ROCEPHIN) 1 g in sodium chloride 0.9 % 100 mL IVPB  Status:  Discontinued        1 g 200  mL/hr over 30 Minutes Intravenous Every 24 hours 05/01/23 0756 05/01/23 0932   04/27/23 1400  piperacillin-tazobactam (ZOSYN) IVPB 3.375 g  Status:  Discontinued        3.375 g 12.5 mL/hr over 240 Minutes Intravenous Every 8 hours 04/27/23 0744 05/01/23 0756   04/27/23 1000  doxycycline (VIBRAMYCIN) 100 mg in dextrose 5 % 250 mL IVPB        100 mg 125 mL/hr over 120 Minutes Intravenous Every 12 hours 04/26/23 1358 04/29/23 1900   04/26/23 2200  piperacillin-tazobactam (ZOSYN) IVPB 4.5 g  Status:  Discontinued        4.5 g 200 mL/hr over 30 Minutes Intravenous Every 8 hours 04/26/23 1921 04/27/23 0744   04/26/23 1000  azithromycin (ZITHROMAX) 500 mg in sodium chloride 0.9 % 250 mL IVPB  Status:  Discontinued        500 mg 250 mL/hr over 60 Minutes Intravenous Every 24 hours 04/26/23 0401 04/26/23 1357   04/26/23 0600  piperacillin-tazobactam (ZOSYN) IVPB 3.375 g  Status:  Discontinued        3.375 g 12.5 mL/hr over 240 Minutes Intravenous Every 8 hours 04/26/23 0407 04/26/23 1921   04/25/23 1915  cefTRIAXone (ROCEPHIN) 2 g in sodium chloride 0.9 % 100 mL IVPB        2 g 200 mL/hr over 30 Minutes Intravenous  Once 04/25/23 1914 04/25/23 2013       Significant Hospital Events: Including procedures, antibiotic start and stop dates in addition to other pertinent events   04/24/2022 - Intubated, admit to ICU extremely hypotensive.  04/28/2022 - Started on Librium taper and Seroquel BID.  04/29/2022 - Remains with significant agitation on SAT and unable to proceed with extubation.  04/30/2022 Mental status with significant improvement. Following commands. Hgb trending down now at 7.3g/dl. Stools brown. NG tube to suction and no signs of bleeding.  05/01/2022 1pRBC given. Improved mental status.  05/02/2022 Patient H&H stable. Did well on SBT however mentation remains extremley poor with significant thick secretions therefore held off on extubation.  05/03/2022 Remains with poor mental status,  not doing well with SBT. My concern is that we are looking at a trach if no improvement in mental status in the next 48hrs.  05/05/2023: Initially tolerated WUA and SBT, however with BM and turning, agitated and severe hypoxia. 05/06/2023: On minimal vent settings, FAILED SBT.  Increase Seroquel to help with agitation.  Decrease free water flushes to 200 q4h. 05/07/2023: On minimal vent settings, perform SBT as tolerated. Diurese with 60 mg IV Lasix x1 dose.  EXTUBATED. 05/08/2023: Pt failed extubation due to worsening acute respiratory failure secondary to excessive secretions requiring reintubation. Pt febrile with worsening leukocytosis, unasyn started for aspiration pneumonia   Interim History / Subjective:  Prior to intubation pt able to follow commands and oriented to self/place with increased work of breathing and excessive secretions   Objective   Blood pressure (!) 162/71, pulse (!) 103, temperature (!) 101.6 F (38.7 C), temperature source Axillary, resp. rate (!) 34, height 5' 7.01" (1.702 m), weight 76.2 kg, SpO2 96%.  Intake/Output Summary (Last 24 hours) at 05/08/2023 0858 Last data filed at 05/08/2023 0700 Gross per 24 hour  Intake 666.53 ml  Output 4290 ml  Net -3623.47 ml   Filed Weights   05/06/23 0344 05/07/23 0500 05/08/23 0500  Weight: 75.8 kg 81.9 kg 76.2 kg    Examination: General: Acutely ill-appearing male, laying in bed, mechanically intubated  HENT: Atraumatic, normocephalic, neck supple, no JVD, orally intubated  Lungs: Diffuse rhonchi throughout, even, non labored  Cardiovascular: Sinus tachycardia, s1s2, no m/r/g, 2+ radial/1+ distal pulses  Abdomen: +BS x4, soft, obese non distended  Extremities: Normal bulk and tone, no deformities Neuro: Sedated, not following commands, PERRL  GU: External catheter draining yellow urine   Resolved Hospital Problem list   Rhabdomyolysis  Thrombocytopenia   Assessment & Plan:   #Acute metabolic  encephalopathy #Sedation needs in setting of mechanical ventilation - Maintain RASS goal of 0 to -1 - PAD protocol to maintain RASS goal: propofol and fentanyl gtts  - Avoid sedating medications as able - Daily wake up assessment -Continue Thiamine, MVI, folic acid -Continue Seroquel to 50 mg BID  #Septic shock ~ RESOLVED Echocardiogram 04/26/23: LVEF >55%, diastolic function unable to be evaluated, RV systolic function mildly reduced, RV size is normal - Continuous telemetry monitoring - Maintain map 65 or higher  - Repeat troponin   #Acute hypoxic respiratory failure  #Aspiration pneumonia (Trach aspirate with Moraxella Ctarrhalis and proteus Mirabilis, gram stain w/ Gram + Cocci) #Mechanical intubation   - Full vent support, implement lung protective strategies - Plateau pressures less than 30 cm H20 - Wean FiO2 & PEEP as tolerated to maintain O2 sats >92% - Follow intermittent Chest X-ray & ABG as needed - Spontaneous Breathing Trials when respiratory parameters met and mental status permits - Implement VAP Bundle - Hypertonic saline and albuterol  - Aggressive pulmonary hygiene - Diuresis as BP and renal function permits   #Aspiration pneumonia ((Trach aspirate with Moraxella Ctarrhalis and proteus Mirabilis, gram stain w/ Gram + Cocci ) ~ TREATED - Trend WBC and monitor fever curve  - Trend PCT  - Repeat respiratory viral panel  - Restarted unasyn: 05/08/23 due to worsening leukocytosis and fever's   #AKI ~IMPROVING #Hypernatremia: worsening  - Trend BMP  - Replace electrolytes as indicated  - Strict I&O's  - Continue D5W @40  ml/hr; will start free water flushes  - Replace electrolytes as indicated~pharmacy following for assistance with electrolyte replacement  #Anemia without obvious signs of bleeding  CT Abd/Pelvis 05/02/2023: without any sign of intraperitoneal or retroperitoneal hematoma - Trend CBC - VTE px: subcutaneous lovenox  - Monitor for s/sx of bleeding   - Transfuse for Hgb <7 - GI consulted, appreciate input: No indication for EGD currently, continue PPI BID for SUP, if further drop in Hgb recommends Tagged RBC scan  #Hypoglycemia  - CBG's q4hrs  - Follow hyper/hypoglycemic protocol   Best Practice (right click and "Reselect all SmartList Selections" daily)   Diet/type: tubefeeds DVT prophylaxis: LMWH GI prophylaxis: PPI Lines: Yes and still needed  Foley: N/A Code Status:  full code Last date of multidisciplinary goals of care discussion [05/08/23]  1/22: Updated pts daughter Verlin Grills via telephone regarding decline in pts condition and the need for reintubation.  All questions were answered  Labs   CBC: Recent Labs  Lab 05/02/23 1428 05/02/23 2033 05/04/23 0900 05/04/23 2359 05/05/23 0400 05/05/23 1637 05/06/23 0428 05/07/23 0445 05/08/23 0248  WBC 9.2   < > 14.4*  --  15.2*  --  15.6* 13.3* 21.5*  NEUTROABS 4.4  --   --   --   --   --   --   --   --   HGB 6.6*   < > 7.1*   < > 6.8* 7.8* 7.5* 7.2* 7.8*  HCT 20.9*   < > 22.7*   < > 21.7* 24.4* 23.9* 22.9* 25.6*  MCV 106.1*   < > 106.6*  --  105.9*  --  105.3* 103.6* 108.9*  PLT 114*   < > 199  --  208  --  205 193 256   < > = values in this interval not displayed.    Basic Metabolic Panel: Recent Labs  Lab 05/02/23 0405 05/03/23 0409 05/04/23 0400 05/04/23 2011 05/05/23 0400 05/06/23 0428 05/07/23 0445 05/08/23 0248  NA 143 147* 151* 150* 151* 149* 147* 151*  K 3.6 4.1 4.5 4.2 4.1 4.0 4.1 4.0  CL 109 113* 120* 118* 120* 119* 116* 118*  CO2 22 21* 23 23 23 22  21* 23  GLUCOSE 137* 122* 142* 143* 140* 132* 146* 99  BUN 92* 92* 90* 80* 78* 66* 60* 55*  CREATININE 2.98* 2.90* 2.53* 2.36* 2.23* 1.97* 1.84* 1.76*  CALCIUM 8.5* 9.0 9.2 8.9 8.7* 8.3* 8.7* 9.2  MG 2.2 2.3 2.4  --   --  2.1  --  2.0  PHOS 3.6 3.3 3.2  --   --   --  4.3 PENDING   GFR: Estimated Creatinine Clearance: 37 mL/min (A) (by C-G formula based on SCr of 1.76 mg/dL (H)). Recent  Labs  Lab 05/02/23 0405 05/02/23 1300 05/03/23 0409 05/04/23 0400 05/05/23 0400 05/06/23 0428 05/07/23 0445 05/08/23 0248  PROCALCITON 1.97  --  1.17  --   --   --   --   --   WBC 7.7   < > 12.0*   < > 15.2* 15.6* 13.3* 21.5*   < > = values in this interval not displayed.    Liver Function Tests: Recent Labs  Lab 05/02/23 1428 05/07/23 0445 05/08/23 0248  AST 26  --   --   ALT 41  --   --   ALKPHOS 72  --   --   BILITOT 1.1  --   --   PROT 5.5*  --   --   ALBUMIN 2.1* 2.0* 2.5*   No results for input(s): "LIPASE", "AMYLASE" in the last 168 hours. No results for input(s): "AMMONIA" in the last 168 hours.  ABG    Component Value Date/Time   PHART 7.39 04/28/2023 0827   PCO2ART 45 04/28/2023 0827   PO2ART 85 04/28/2023 0827   HCO3 24.9 05/08/2023 0430   ACIDBASEDEF 0.6 05/08/2023 0430   O2SAT 79.3 05/08/2023 0430     Coagulation Profile: No results for input(s): "INR", "PROTIME" in the last 168 hours.  Cardiac Enzymes: No results for input(s): "CKTOTAL", "CKMB", "CKMBINDEX", "TROPONINI" in the last 168 hours.  HbA1C: Hgb A1c MFr Bld  Date/Time Value Ref Range Status  04/27/2023 04:24 AM 5.1 4.8 - 5.6 % Final    Comment:    (NOTE) Pre diabetes:          5.7%-6.4%  Diabetes:              >6.4%  Glycemic control for   <7.0% adults with diabetes   04/26/2023 03:25 AM 5.1 4.8 - 5.6 % Final    Comment:    (NOTE) Pre diabetes:  5.7%-6.4%  Diabetes:              >6.4%  Glycemic control for   <7.0% adults with diabetes     CBG: Recent Labs  Lab 05/07/23 1933 05/07/23 2017 05/07/23 2354 05/08/23 0458 05/08/23 0822  GLUCAP <10* 122* 74 94 108*    Review of Systems:   Unable to assess due to AMS/Intubation/sedation    Past Medical History:  He,  has a past medical history of EtOH dependence (HCC), History of echocardiogram, and Incarcerated inguinal hernia.   Surgical History:   Past Surgical History:  Procedure Laterality Date    INGUINAL HERNIA REPAIR Right 11/01/2016   Procedure: HERNIA REPAIR INGUINAL INCARCERATED;  Surgeon: Leafy Ro, MD;  Location: [facility] ORS;  Service: General;  Laterality: Right;   NO PAST SURGERIES       Social History:   reports that he has been smoking cigarettes. His smokeless tobacco use includes chew. He reports current alcohol use of about 126.0 standard drinks of alcohol per week. He reports that he does not use drugs.   Family History:  His Family history is unknown by patient.   Allergies No Known Allergies   Home Medications  Prior to Admission medications   Not on File     Critical care time: 67 minutes     Zada Girt, AGNP  Pulmonary/Critical Care Pager [phone] (please enter 7 digits) PCCM Consult Pager [phone] (please enter 7 digits)

## 2023-05-08 NOTE — Progress Notes (Addendum)
PT Cancellation Note   Patient Details Name: Curtis [name] MRN: [id number] DOB: 1955/12/04   Cancelled Treatment:    Reason Eval/Treat Not Completed: Medical issues which prohibited therapy (Multiple current medical issues with elevated heart rate, respiratory rate, and mental status. Not appropriate at this time. Will continue with attempts.)  Addendum: Patient has been intubated. Will complete PT orders at this time. Please re-consult when appropriate.   Donna Bernard, PT, MPT   Ina Homes 05/08/2023, 9:00 AM

## 2023-05-08 NOTE — Progress Notes (Signed)
Patient with heart rate 150-160's.  Curlene Dolphin notified.  Order obtained for metoprolol IV, see MAR

## 2023-05-08 NOTE — Progress Notes (Signed)
Nutrition Follow-up  DOCUMENTATION CODES:   Not applicable  INTERVENTION:   -TF via OGT:   Initiate Vital 1.2 @ 25 ml/hr and increase by 10 ml every 4 hours to goal rate of 65 ml/hr.   30 ml free water flush every 4 hours  Tube feeding regimen provides 1872 kcal (99% of needs), 117 grams of protein, and 1265 ml of H2O.  Total free water: 1445 ml daily  NUTRITION DIAGNOSIS:   Inadequate oral intake related to inability to eat (pt sedated and ventilated) as evidenced by NPO status.  Ongoing  GOAL:   Patient will meet greater than or equal to 90% of their needs  Progressing   MONITOR:   Vent status, TF tolerance  REASON FOR ASSESSMENT:   Consult Enteral/tube feeding initiation and management  ASSESSMENT:   68 y/o male with h/o SBO secondary to strangulated inguinal hernia s/p repair 2018, etoh abuse and seizures who is admitted with AMS, aspiration pneumonia, sepsis, AKI and  rhabdomyolysis.  1/17- s/p SBT- extubation held off secondary to poor mentation and significant thick secretions 1/19- rectal tube placed, per GI no signs of GIB 1/21- extubated 1/22- reintubated, unasyn started for aspiration pneumonia  Patient is currently intubated on ventilator support. OGT placed today; per KUB tip of tube in stomach.  MV: 7 L/min Temp (24hrs), Avg:99.8 F (37.7 C), Min:98.4 F (36.9 C), Max:101.6 F (38.7 C)  Propofol: 22.5 ml/hr (provides 594 kcals daily)  Case discussed with PCCM; pt reintiubated today and plan to start TF.   Palliative care following for goals of care discussions.   Medications reviewed and include sodium chloride, colace, lovenox, folic acid, solu-medrol, protonix, miralax, and thiamine.   Labs reviewed: Na: 151, CBGS: 94-113 (inpatient orders for glycemic control are none).    NUTRITION - FOCUSED PHYSICAL EXAM:  Flowsheet Row Most Recent Value  Orbital Region Mild depletion  Upper Arm Region No depletion  Thoracic and Lumbar Region No  depletion  Buccal Region No depletion  Temple Region Moderate depletion  Clavicle Bone Region No depletion  Clavicle and Acromion Bone Region No depletion  Scapular Bone Region No depletion  Dorsal Hand Mild depletion  Patellar Region Moderate depletion  Anterior Thigh Region Moderate depletion  Posterior Calf Region Moderate depletion  Edema (RD Assessment) None  Hair Reviewed  Eyes Reviewed  Mouth Reviewed  Skin Reviewed  Nails Reviewed       Diet Order:   Diet Order             Diet NPO time specified  Diet effective now                   EDUCATION NEEDS:   No education needs have been identified at this time  Skin:  Skin Assessment: Skin Integrity Issues: Skin Integrity Issues:: Stage II Stage II: buttocks, mid anus  Last BM:  05/08/23 (130 ml via rectal tube)  Height:   Ht Readings from Last 1 Encounters:  05/03/23 5' 7.01" (1.702 m)    Weight:   Wt Readings from Last 1 Encounters:  05/08/23 76.2 kg    Ideal Body Weight:  67.2 kg  BMI:  Body mass index is 26.3 kg/m.  Estimated Nutritional Needs:   Kcal:  1899  Protein:  105-120 grams  Fluid:  1.8-2.0 L    Levada Schilling, RD, LDN, CDCES Registered Dietitian III Certified Diabetes Care and Education Specialist If unable to reach this RD, please use "RD Inpatient" group chat on secure  chat between hours of 8am-4 pm daily

## 2023-05-08 NOTE — Progress Notes (Signed)
0230 - Pt tachy into 130s, desating to 86% on 10L HFNC.  Pt deep suctioned, O2 increased briefly, NP Cheryll Cockayne notified.  One time lasix 40mg  + metoprolol 5mg  ordered.  0400 - Pt with persistent tachypnea, One time morphine 2mg  ordered

## 2023-05-08 NOTE — Procedures (Signed)
Endotracheal Intubation: Patient required placement of an artificial airway secondary to acute hypoxic respiratory failure    Consent: Emergent.    Hand washing performed prior to starting the procedure.    Medications administered for sedation prior to procedure:  Versed 4 mg IV, 20 mg Etomidate IV, Rocuronium 75 mg IV     A time out procedure was called and correct patient, name, & ID confirmed. Needed supplies and equipment were assembled and checked to include ETT, 10 ml syringe, Glidescope, Mac and Miller blades, suction, oxygen and bag mask valve, end tidal CO2 monitor.    Patient was positioned to align the mouth and pharynx to facilitate visualization of the glottis.    Heart rate, SpO2 and blood pressure was continuously monitored during the procedure. Pre-oxygenation was conducted prior to intubation and endotracheal tube was placed through the vocal cords into the trachea.       The artificial airway was placed under direct visualization via glidescope route using a 7.5 cmETT on the first attempt.   ETT was secured at 25 cm.   Placement was confirmed by auscuitation of lungs with good breath sounds bilaterally and no stomach sounds.  Condensation was noted on endotracheal tube.   Pulse ox 92% CO2 detector in place with appropriate color change.    Complications: None .        Chest radiograph ordered and pending.   Zada Girt, AGNP  Pulmonary/Critical Care Pager [phone] (please enter 7 digits) PCCM Consult Pager [phone] (please enter 7 digits)

## 2023-05-08 NOTE — Progress Notes (Signed)
15:15 patient with no urine output since intubation this morning at 09:30am.  Bladder scan preformed and revealed in bladder. Zada Girt notified and order placed for foley catheter.  Foley placed per policy and procedure without complication.  of clear yellow urine returned.

## 2023-05-09 DIAGNOSIS — R6520 Severe sepsis without septic shock: Secondary | ICD-10-CM | POA: Diagnosis not present

## 2023-05-09 DIAGNOSIS — Z7189 Other specified counseling: Secondary | ICD-10-CM | POA: Diagnosis not present

## 2023-05-09 DIAGNOSIS — A419 Sepsis, unspecified organism: Secondary | ICD-10-CM | POA: Diagnosis not present

## 2023-05-09 DIAGNOSIS — G9341 Metabolic encephalopathy: Secondary | ICD-10-CM | POA: Diagnosis not present

## 2023-05-09 LAB — RENAL FUNCTION PANEL
Albumin: 2.5 g/dL — ABNORMAL LOW (ref 3.5–5.0)
Albumin: 2.1 g/dL — ABNORMAL LOW (ref 3.5–5.0)
Anion gap: 10 (ref 5–15)
Anion gap: 11 (ref 5–15)
BUN: 55 mg/dL — ABNORMAL HIGH (ref 8–23)
BUN: 47 mg/dL — ABNORMAL HIGH (ref 8–23)
CO2: 23 mmol/L (ref 22–32)
CO2: 25 mmol/L (ref 22–32)
Calcium: 9.2 mg/dL (ref 8.9–10.3)
Calcium: 8.8 mg/dL — ABNORMAL LOW (ref 8.9–10.3)
Chloride: 118 mmol/L — ABNORMAL HIGH (ref 98–111)
Chloride: 111 mmol/L (ref 98–111)
Creatinine, Ser: 1.76 mg/dL — ABNORMAL HIGH (ref 0.61–1.24)
Creatinine, Ser: 1.78 mg/dL — ABNORMAL HIGH (ref 0.61–1.24)
GFR, Estimated: 41 mL/min — ABNORMAL LOW (ref >60)
GFR, Estimated: 41 mL/min — ABNORMAL LOW (ref >60)
Glucose, Bld: 99 mg/dL (ref 70–99)
Glucose, Bld: 163 mg/dL — ABNORMAL HIGH (ref 70–99)
Phosphorus: 4.6 mg/dL (ref 2.5–4.6)
Phosphorus: 4.1 mg/dL (ref 2.5–4.6)
Potassium: 4 mmol/L (ref 3.5–5.1)
Potassium: 3.7 mmol/L (ref 3.5–5.1)
Sodium: 151 mmol/L — ABNORMAL HIGH (ref 135–145)
Sodium: 147 mmol/L — ABNORMAL HIGH (ref 135–145)

## 2023-05-09 LAB — GLUCOSE, CAPILLARY
Glucose-Capillary: 134 mg/dL — ABNORMAL HIGH (ref 70–99)
Glucose-Capillary: 138 mg/dL — ABNORMAL HIGH (ref 70–99)
Glucose-Capillary: 146 mg/dL — ABNORMAL HIGH (ref 70–99)
Glucose-Capillary: 146 mg/dL — ABNORMAL HIGH (ref 70–99)
Glucose-Capillary: 167 mg/dL — ABNORMAL HIGH (ref 70–99)

## 2023-05-09 LAB — CBC
HCT: 23.1 % — ABNORMAL LOW (ref 39.0–52.0)
Hemoglobin: 7 g/dL — ABNORMAL LOW (ref 13.0–17.0)
MCH: 33.3 pg (ref 26.0–34.0)
MCHC: 30.3 g/dL (ref 30.0–36.0)
MCV: 110 fL — ABNORMAL HIGH (ref 80.0–100.0)
Platelets: 207 10*3/uL (ref 150–400)
RBC: 2.1 MIL/uL — ABNORMAL LOW (ref 4.22–5.81)
RDW: 15.9 % — ABNORMAL HIGH (ref 11.5–15.5)
WBC: 9.3 10*3/uL (ref 4.0–10.5)
nRBC: 0 % (ref 0.0–0.2)

## 2023-05-09 LAB — HEMOGLOBIN AND HEMATOCRIT, BLOOD
HCT: 22.6 % — ABNORMAL LOW (ref 39.0–52.0)
Hemoglobin: 7.4 g/dL — ABNORMAL LOW (ref 13.0–17.0)

## 2023-05-09 LAB — TRIGLYCERIDES: Triglycerides: 136 mg/dL (ref <150)

## 2023-05-09 LAB — MAGNESIUM: Magnesium: 1.7 mg/dL (ref 1.7–2.4)

## 2023-05-09 MED ORDER — FREE WATER
200.0000 mL | Status: DC
Start: 2023-05-09 — End: 2023-05-13
  Administered 2023-05-09 – 2023-05-13 (×25): 200 mL

## 2023-05-09 MED ORDER — ORAL CARE MOUTH RINSE: 15.0000 mL | OROMUCOSAL | Status: DC | PRN

## 2023-05-09 MED ORDER — MAGNESIUM SULFATE 2 GM/50ML IV SOLN
2.0000 g | Freq: Once | INTRAVENOUS | Status: AC
  Administered 2023-05-09: 2 g via INTRAVENOUS
  Filled 2023-05-09: qty 50

## 2023-05-09 MED ORDER — PROPOFOL 1000 MG/100ML IV EMUL
5.0000 ug/kg/min | INTRAVENOUS | Status: DC
Start: 2023-05-09 — End: 2023-05-10
  Administered 2023-05-09: 45 ug/kg/min via INTRAVENOUS
  Administered 2023-05-09: 5 ug/kg/min via INTRAVENOUS
  Administered 2023-05-09: 30 ug/kg/min via INTRAVENOUS
  Administered 2023-05-10 (×3): 75 ug/kg/min via INTRAVENOUS
  Administered 2023-05-10: 80 ug/kg/min via INTRAVENOUS
  Filled 2023-05-09 (×9): qty 100

## 2023-05-09 MED ORDER — SODIUM CHLORIDE 0.9% FLUSH: 10.0000 mL | INTRAVENOUS | Status: DC | PRN

## 2023-05-09 MED ORDER — INSULIN ASPART 100 UNIT/ML IJ SOLN
0.0000 [IU] | INTRAMUSCULAR | Status: DC
  Administered 2023-05-09 (×2): 1 [IU] via SUBCUTANEOUS
  Administered 2023-05-09: 2 [IU] via SUBCUTANEOUS
  Administered 2023-05-09 – 2023-05-10 (×5): 1 [IU] via SUBCUTANEOUS
  Administered 2023-05-11: 2 [IU] via SUBCUTANEOUS
  Administered 2023-05-11 (×2): 1 [IU] via SUBCUTANEOUS
  Administered 2023-05-12: 2 [IU] via SUBCUTANEOUS
  Administered 2023-05-12: 1 [IU] via SUBCUTANEOUS
  Administered 2023-05-12: 2 [IU] via SUBCUTANEOUS
  Administered 2023-05-13 – 2023-05-17 (×8): 1 [IU] via SUBCUTANEOUS
  Filled 2023-05-09 (×24): qty 1

## 2023-05-09 MED ORDER — NOREPINEPHRINE 4 MG/250ML-% IV SOLN
0.0000 ug/min | INTRAVENOUS | Status: DC
  Administered 2023-05-09: 2 ug/min via INTRAVENOUS
  Filled 2023-05-09 (×3): qty 250

## 2023-05-09 MED ORDER — PROPOFOL 1000 MG/100ML IV EMUL
INTRAVENOUS | Status: AC
  Filled 2023-05-09: qty 100

## 2023-05-09 MED ORDER — ORAL CARE MOUTH RINSE
15.0000 mL | OROMUCOSAL | Status: DC
  Administered 2023-05-09 – 2023-05-16 (×80): 15 mL via OROMUCOSAL

## 2023-05-09 MED ORDER — SODIUM CHLORIDE 0.9% FLUSH
10.0000 mL | Freq: Two times a day (BID) | INTRAVENOUS | Status: DC
  Administered 2023-05-09: 30 mL
  Administered 2023-05-09 – 2023-05-12 (×6): 10 mL
  Administered 2023-05-12: 30 mL
  Administered 2023-05-13 (×2): 10 mL
  Administered 2023-05-14: 20 mL
  Administered 2023-05-14 – 2023-05-21 (×12): 10 mL

## 2023-05-09 MED ORDER — SODIUM CHLORIDE 0.9% IV SOLUTION: Freq: Once | INTRAVENOUS | Status: AC

## 2023-05-09 NOTE — Progress Notes (Signed)
NAME:  Curtis [name], MRN:  [id number], DOB:  Jan 10, 1956, LOS: 14 ADMISSION DATE:  04/25/2023, CONSULTATION DATE:  04/25/23 CHIEF COMPLAINT:  Severe Hypothermia   Brief Pt Description / Synopsis:  68 year old male patient with a past medical history of incarcerated inguinal hernia s/p repair, SBO, alcohol withdrawal seizures, EtOH abuse who presented to [facility] on 04/24/2022 for unresponsiveness. He was found to be profoundly hypothermic with temperature measuring 77 F. Subsequently was intubated on 04/24/2022. He was actively rewarmed and started on stress dose steroids. Currently normothermic. Course also complicated by rhabdomyolysis with CK peaking at 6000 and currently downtrending. Finally course complicated by acute hypoxic respiratory failure suspecting aspiration for which she is on Zosyn and doxycycline. Tracheal aspirate w/ Moraxella Cat Patient obstacle to extubation is mental status remains agitated with SAT.  History of Present Illness:  le with significant PMH of incarcerated inguinal hernia s/p repair, SBO, alcohol withdrawal seizure, EtOH abuse who presented to the ED with chief complaints of unresponsiveness.   Per ED report, EMS was dispatched to patient's residence by a neighbor who found the patient unconscious.  On EMS arrival, patient was found unresponsive on floor of bedroom. Neighbor report LKNW was 3 days ago. Patient was initially combative and would tense resist any treatment. EMS report they were unable to obtain a temperature or spo2 due to hypothermia and unable to obtain bp due to combative resistance. patient initial cbg registered at 71 treated with 1mg  glucagon repeat reading were in the upper 20's   ED Course: Initial vital signs showed HR of 55 beats/minute, BP 99/47 mm Hg, the RR 22 breaths/minute, and the oxygen saturation 100% on NRB and a temperature of 26F (25C). Patient intubated in for airway protection. Pertinent Labs/Diagnostics Findings: Na+/ K+:131/4.8   Glucose: 286 BUN/Cr.: 31/1.91 CO2 1, Anion Gap 23, AST/ALT:284/83 WBC: 15.7K/L with neutrophil predominance  Lactic acid: >9.0 COVID PCR: Negative,  troponin: 32  CK 5249 ETOH:177 VBG: pO2 46.2; pCO2 72; pH 6.96;  HCO3 16.2, %O2 Sat 46.6.  CXR> CTH> CTA Chest> CT Abd/pelvis>see results below Medication administered in the WU:[id number] given 30 cc/kg of fluids and started on broad-spectrum antibiotics with Ceftriaxone for suspected sepsis with septic shock.   Pertinent  Medical History   Past Medical History:  Diagnosis Date   EtOH dependence (HCC)    History of echocardiogram    a. 04/2023 Echo: EF >55%, mildly reduced RV fxn, triv MR.   Incarcerated inguinal hernia    a. 10/2016 s/p repair.    Micro Data:  1/9: SARS-CoV-2/RSV/influenza PCR>> negative 1/9: HIV screen>> nonreactive 1/9: Blood culture x 2>> no growth 1/9: MRSA PCR>> negative 1/10: Respiratory viral panel>> negative 1/10: Mycoplasma pneumoniae IgM>> less than 770 1/10: Strep pneumo and Legionella urinary antigens>> negative 1/10: Tracheal aspirate>> Proteus mirabilis, Moraxella Catarrhalis (beta lactamase +) 1/11: Acute viral hepatitis panel>> nonreactive 1/13: Blood culture x 2>> no growth  Antimicrobials:   Anti-infectives (From admission, onward)    Start     Dose/Rate Route Frequency Ordered Stop   05/08/23 1400  piperacillin-tazobactam (ZOSYN) IVPB 3.375 g        3.375 g 12.5 mL/hr over 240 Minutes Intravenous Every 8 hours 05/08/23 1012     05/08/23 1000  Ampicillin-Sulbactam (UNASYN) 3 g in sodium chloride 0.9 % 100 mL IVPB  Status:  Discontinued        3 g 200 mL/hr over 30 Minutes Intravenous Every 6 hours 05/08/23 0803 05/08/23 1011   05/05/23 0900  cefTRIAXone (ROCEPHIN)  2 g in sodium chloride 0.9 % 100 mL IVPB        2 g 200 mL/hr over 30 Minutes Intravenous  Once 05/04/23 1202 05/05/23 1312   05/01/23 1030  cefTRIAXone (ROCEPHIN) 2 g in sodium chloride 0.9 % 100 mL IVPB        2 g 200 mL/hr  over 30 Minutes Intravenous Daily 05/01/23 0932 05/04/23 1913   05/01/23 1000  cefTRIAXone (ROCEPHIN) 1 g in sodium chloride 0.9 % 100 mL IVPB  Status:  Discontinued        1 g 200 mL/hr over 30 Minutes Intravenous Every 24 hours 05/01/23 0756 05/01/23 0932   04/27/23 1400  piperacillin-tazobactam (ZOSYN) IVPB 3.375 g  Status:  Discontinued        3.375 g 12.5 mL/hr over 240 Minutes Intravenous Every 8 hours 04/27/23 0744 05/01/23 0756   04/27/23 1000  doxycycline (VIBRAMYCIN) 100 mg in dextrose 5 % 250 mL IVPB        100 mg 125 mL/hr over 120 Minutes Intravenous Every 12 hours 04/26/23 1358 04/29/23 1900   04/26/23 2200  piperacillin-tazobactam (ZOSYN) IVPB 4.5 g  Status:  Discontinued        4.5 g 200 mL/hr over 30 Minutes Intravenous Every 8 hours 04/26/23 1921 04/27/23 0744   04/26/23 1000  azithromycin (ZITHROMAX) 500 mg in sodium chloride 0.9 % 250 mL IVPB  Status:  Discontinued        500 mg 250 mL/hr over 60 Minutes Intravenous Every 24 hours 04/26/23 0401 04/26/23 1357   04/26/23 0600  piperacillin-tazobactam (ZOSYN) IVPB 3.375 g  Status:  Discontinued        3.375 g 12.5 mL/hr over 240 Minutes Intravenous Every 8 hours 04/26/23 0407 04/26/23 1921   04/25/23 1915  cefTRIAXone (ROCEPHIN) 2 g in sodium chloride 0.9 % 100 mL IVPB        2 g 200 mL/hr over 30 Minutes Intravenous  Once 04/25/23 1914 04/25/23 2013       Significant Hospital Events: Including procedures, antibiotic start and stop dates in addition to other pertinent events   04/24/2022 - Intubated, admit to ICU extremely hypotensive.  04/28/2022 - Started on Librium taper and Seroquel BID.  04/29/2022 - Remains with significant agitation on SAT and unable to proceed with extubation.  04/30/2022 Mental status with significant improvement. Following commands. Hgb trending down now at 7.3g/dl. Stools brown. NG tube to suction and no signs of bleeding.  05/01/2022 1pRBC given. Improved mental status.  05/02/2022  Patient H&H stable. Did well on SBT however mentation remains extremley poor with significant thick secretions therefore held off on extubation.  05/03/2022 Remains with poor mental status, not doing well with SBT. My concern is that we are looking at a trach if no improvement in mental status in the next 48hrs.  05/05/2023: Initially tolerated WUA and SBT, however with BM and turning, agitated and severe hypoxia. 05/06/2023: On minimal vent settings, FAILED SBT.  Increase Seroquel to help with agitation.  Decrease free water flushes to 200 q4h. 05/07/2023: On minimal vent settings, perform SBT as tolerated. Diurese with 60 mg IV Lasix x1 dose.  EXTUBATED. 05/08/2023: Pt failed extubation due to worsening acute respiratory failure secondary to excessive secretions requiring reintubation. Pt febrile with worsening leukocytosis, unasyn started for aspiration pneumonia  05/09/2023: Pt remains mechanically intubated FiO2 55%/PEEP 5.  Currently sedated with precedex and low dose fentanyl gtt.  Pt bradycardic will discontinue precedex gtt and restart propofol gtt  Interim  History / Subjective:  As outlined above under significant events   Objective   Blood pressure 119/64, pulse (!) 48, temperature (!) 97.5 F (36.4 C), temperature source Axillary, resp. rate 15, height 5' 7.01" (1.702 m), weight 77.6 kg, SpO2 100%.    Vent Mode: PRVC FiO2 (%):  [40 %-55 %] 55 % Set Rate:  [15 bmp] 15 bmp Vt Set:  [500 mL] 500 mL PEEP:  [5 cmH20] 5 cmH20 Plateau Pressure:  [17 cmH20-18 cmH20] 17 cmH20   Intake/Output Summary (Last 24 hours) at 05/09/2023 4098 Last data filed at 05/09/2023 1191 Gross per 24 hour  Intake 1862.35 ml  Output 3130 ml  Net -1267.65 ml   Filed Weights   05/07/23 0500 05/08/23 0500 05/09/23 0500  Weight: 81.9 kg 76.2 kg 77.6 kg    Examination: General: Acutely ill-appearing male, laying in bed, mechanically intubated  HENT: Atraumatic, normocephalic, neck supple, no JVD, orally  intubated  Lungs: Rhonchi throughout, even, non labored  Cardiovascular: Sinus rhythm s1s2, no m/r/g, 1+ radial/1+ distal pulses  Abdomen: +BS x4, soft, obese non distended  Extremities: Normal bulk and tone, no deformities Neuro: Sedated, not following commands but purposeful movement present, PERRL  GU: Indwelling foley catheter draining yellow urine   Resolved Hospital Problem list   Rhabdomyolysis  Thrombocytopenia   Assessment & Plan:   #Acute metabolic encephalopathy #Sedation needs in setting of mechanical ventilation - Maintain RASS goal of 0 to -1 - PAD protocol to maintain RASS goal: propofol and fentanyl gtts  - Avoid sedating medications as able - Daily wake up assessment - Continue thiamine, MVI, folic acid  #Septic shock ~ RESOLVED #Intermittent atrial fibrillation with rvr  Echocardiogram 04/26/23: LVEF >55%, diastolic function unable to be evaluated, RV systolic function mildly reduced, RV size is normal - Continuous telemetry monitoring - Maintain map 65 or higher  - Prn metoprolol for sustained hr >120 bpm - Troponin peaked at 885   #Acute hypoxic respiratory failure  #Aspiration pneumonia (Trach aspirate with Moraxella Ctarrhalis and proteus Mirabilis, gram stain w/ Gram + Cocci) #Mechanical intubation   - Full vent support, implement lung protective strategies - Plateau pressures less than 30 cm H20 - Wean FiO2 & PEEP as tolerated to maintain O2 sats >92% - Follow intermittent Chest X-ray & ABG as needed - Spontaneous Breathing Trials when respiratory parameters met and mental status permits - Implement VAP Bundle - Scheduled bronchodilator therapy  - IV steroids  - Aggressive pulmonary hygiene - Diuresis as BP and renal function permits   #Aspiration pneumonia ((Trach aspirate with Moraxella Ctarrhalis and proteus Mirabilis, gram stain w/ Gram + Cocci ) ~ TREATED - Trend WBC and monitor fever curve  - Trend PCT  - Follow respiratory culture  -  Continue unasyn   #AKI~IMPROVING #Hypernatremia~IMPROVING  - Trend BMP  - Replace electrolytes as indicated  - Strict I&O's  - Increase free water flush to 200 ml q4hrs  - Replace electrolytes as indicated~pharmacy following for assistance with electrolyte replacement  #Anemia without obvious signs of bleeding  CT Abd/Pelvis 05/02/2023: without any sign of intraperitoneal or retroperitoneal hematoma - Trend CBC - VTE px: subcutaneous lovenox  - Monitor for s/sx of bleeding  - Transfuse for Hgb <8 - GI consulted, appreciate input: No indication for EGD currently, continue PPI BID for SUP, if further drop in Hgb recommends Tagged RBC scan  #Hyperglycemia  #Hypoglycemia~resolved   - CBG's q4hrs  - Sensitive SSI  - Follow hyper/hypoglycemic protocol   Best  Practice (right click and "Reselect all SmartList Selections" daily)   Diet/type: tubefeeds DVT prophylaxis: LMWH GI prophylaxis: PPI Lines: Yes and still needed  Foley: Yes and still needed  Code Status:  full code Last date of multidisciplinary goals of care discussion [05/09/23]  1/23: Will update pts daughter when she arrives at bedside  Labs   CBC: Recent Labs  Lab 05/02/23 1428 05/02/23 2033 05/05/23 0400 05/05/23 1637 05/06/23 0428 05/07/23 0445 05/08/23 0248 05/09/23 0421  WBC 9.2   < > 15.2*  --  15.6* 13.3* 21.5* 9.3  NEUTROABS 4.4  --   --   --   --   --   --   --   HGB 6.6*   < > 6.8* 7.8* 7.5* 7.2* 7.8* 7.0*  HCT 20.9*   < > 21.7* 24.4* 23.9* 22.9* 25.6* 23.1*  MCV 106.1*   < > 105.9*  --  105.3* 103.6* 108.9* 110.0*  PLT 114*   < > 208  --  205 193 256 207   < > = values in this interval not displayed.    Basic Metabolic Panel: Recent Labs  Lab 05/04/23 0400 05/04/23 2011 05/05/23 0400 05/06/23 0428 05/07/23 0445 05/08/23 0248 05/08/23 1934 05/09/23 0421  NA 151*   < > 151* 149* 147* 151*  --  147*  K 4.5   < > 4.1 4.0 4.1 4.0 4.4 3.7  CL 120*   < > 120* 119* 116* 118*  --  111  CO2 23    < > 23 22 21* 23  --  25  GLUCOSE 142*   < > 140* 132* 146* 99  --  163*  BUN 90*   < > 78* 66* 60* 55*  --  47*  CREATININE 2.53*   < > 2.23* 1.97* 1.84* 1.76*  --  1.78*  CALCIUM 9.2   < > 8.7* 8.3* 8.7* 9.2  --  8.8*  MG 2.4  --   --  2.1  --  2.0 1.9 1.7  PHOS 3.2  --   --   --  4.3 4.6 5.1* 4.1   < > = values in this interval not displayed.   GFR: Estimated Creatinine Clearance: 36.6 mL/min (A) (by C-G formula based on SCr of 1.78 mg/dL (H)). Recent Labs  Lab 05/03/23 0409 05/04/23 0400 05/06/23 0428 05/07/23 0445 05/08/23 0248 05/08/23 1213 05/09/23 0421  PROCALCITON 1.17  --   --   --   --  0.35  --   WBC 12.0*   < > 15.6* 13.3* 21.5*  --  9.3  LATICACIDVEN  --   --   --   --   --  0.9  --    < > = values in this interval not displayed.    Liver Function Tests: Recent Labs  Lab 05/02/23 1428 05/07/23 0445 05/08/23 0248 05/09/23 0421  AST 26  --   --   --   ALT 41  --   --   --   ALKPHOS 72  --   --   --   BILITOT 1.1  --   --   --   PROT 5.5*  --   --   --   ALBUMIN 2.1* 2.0* 2.5* 2.1*   No results for input(s): "LIPASE", "AMYLASE" in the last 168 hours. No results for input(s): "AMMONIA" in the last 168 hours.  ABG    Component Value Date/Time   PHART 7.41 05/08/2023 0947  PCO2ART 39 05/08/2023 0947   PO2ART 94 05/08/2023 0947   HCO3 24.7 05/08/2023 0947   ACIDBASEDEF 0.6 05/08/2023 0430   O2SAT 99.6 05/08/2023 0947     Coagulation Profile: No results for input(s): "INR", "PROTIME" in the last 168 hours.  Cardiac Enzymes: No results for input(s): "CKTOTAL", "CKMB", "CKMBINDEX", "TROPONINI" in the last 168 hours.  HbA1C: Hgb A1c MFr Bld  Date/Time Value Ref Range Status  04/27/2023 04:24 AM 5.1 4.8 - 5.6 % Final    Comment:    (NOTE) Pre diabetes:          5.7%-6.4%  Diabetes:              >6.4%  Glycemic control for   <7.0% adults with diabetes   04/26/2023 03:25 AM 5.1 4.8 - 5.6 % Final    Comment:    (NOTE) Pre diabetes:           5.7%-6.4%  Diabetes:              >6.4%  Glycemic control for   <7.0% adults with diabetes     CBG: Recent Labs  Lab 05/08/23 0822 05/08/23 1118 05/08/23 1622 05/08/23 1919 05/08/23 2313  GLUCAP 108* 113* 153* 138* 117*    Review of Systems:   Unable to assess due to AMS/Intubation/sedation    Past Medical History:  He,  has a past medical history of EtOH dependence (HCC), History of echocardiogram, and Incarcerated inguinal hernia.   Surgical History:   Past Surgical History:  Procedure Laterality Date   INGUINAL HERNIA REPAIR Right 11/01/2016   Procedure: HERNIA REPAIR INGUINAL INCARCERATED;  Surgeon: Leafy Ro, MD;  Location: [facility] ORS;  Service: General;  Laterality: Right;   NO PAST SURGERIES       Social History:   reports that he has been smoking cigarettes. His smokeless tobacco use includes chew. He reports current alcohol use of about 126.0 standard drinks of alcohol per week. He reports that he does not use drugs.   Family History:  His Family history is unknown by patient.   Allergies Allergies  Allergen Reactions   Robinul [Glycopyrrolate] Other (See Comments)    Arrhythmia's     Home Medications  Prior to Admission medications   Not on File     Critical care time: 37 minutes     Zada Girt, AGNP  Pulmonary/Critical Care Pager [phone] (please enter 7 digits) PCCM Consult Pager [phone] (please enter 7 digits)

## 2023-05-09 NOTE — Progress Notes (Signed)
Peripherally Inserted Central Catheter Placement  The IV Nurse has discussed with the patient and/or persons authorized to consent for the patient, the purpose of this procedure and the potential benefits and risks involved with this procedure.  The benefits include less needle sticks, lab draws from the catheter, and the patient may be discharged home with the catheter. Risks include, but not limited to, infection, bleeding, blood clot (thrombus formation), and puncture of an artery; nerve damage and irregular heartbeat and possibility to perform a PICC exchange if needed/ordered by physician.  Alternatives to this procedure were also discussed.  Bard Power PICC patient education guide, fact sheet on infection prevention and patient information card has been provided to patient /or left at bedside. Telephone consent obtained by Jaci Standard.   PICC Placement Documentation  PICC Triple Lumen 05/09/23 Right Brachial 39 cm 0 cm (Active)  Indication for Insertion or Continuance of Line Vasoactive infusions;Administration of hyperosmolar/irritating solutions (i.e. TPN, Vancomycin, etc.);Prolonged intravenous therapies 05/09/23 1135  Exposed Catheter (cm) 0 cm 05/09/23 1135  Site Assessment Clean, Dry, Intact 05/09/23 1135  Lumen #1 Status Flushed;Saline locked;Blood return noted 05/09/23 1135  Lumen #2 Status Flushed;Saline locked;Blood return noted 05/09/23 1135  Lumen #3 Status Flushed;Saline locked;Blood return noted 05/09/23 1135  Dressing Type Transparent;Securing device 05/09/23 1135  Dressing Status Antimicrobial disc/dressing in place;Clean, Dry, Intact 05/09/23 1135  Line Care Connections checked and tightened 05/09/23 1135  Line Adjustment (NICU/IV Team Only) No 05/09/23 1135  Dressing Intervention New dressing 05/09/23 1135  Dressing Change Due 05/16/23 05/09/23 1135       Curtis [name] 05/09/2023, 11:35 AM

## 2023-05-09 NOTE — Progress Notes (Signed)
Daily Progress Note   Patient Name: Curtis [name]       Date: 05/09/2023 DOB: 08-09-1955  Age: 68 y.o. MRN#: [id number] Attending Physician: Erin Fulling, MD Primary Care Physician: Patient, No Pcp Per Admit Date: 04/25/2023  Reason for Consultation/Follow-up: Establishing goals of care  Subjective: Notes and labs reviewed. In to see patient. He is in bed on ventilator. Patient's daughter and aunt are at bedside.  They discuss that he lives alone, but next door to his sister. 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 They advise he is always on the go. 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    We discussed his diagnoses, prognosis, GOC, EOL wishes disposition and options.  Created space and opportunity for patient  to explore thoughts and feelings regarding current medical information.   A detailed discussion was had today regarding advanced directives.  Concepts specific to code status, artifical feeding and hydration, IV antibiotics and rehospitalization were discussed.  The difference between an aggressive medical intervention path and a comfort care path was discussed.  Values and goals of care important to patient and family were attempted to be elicited.  Discussed limitations of medical interventions to prolong quality of life in some situations and discussed the concept of human mortality.  Daughter discusses he would not want to have a tracheostomy and would not want to be reintubated once extubated. She would like for patient to be medically optimized for 1 way extubation. She states she would not want him to have CPR as she states he would not have an acceptable QOL.    She had another family member come in, and information  above was rediscussed.   Updated CCM. Tentative plans in place to extubate Monday. Spoke with daughter to make her aware of CCM discussion, and a fourth family member/friend was present. At her request, she was updated and her questions were answered.   I completed a MOST form today with daughter and the signed original was placed in the chart. Each section of options on the form were reviewed in full detail and any questions were answered as needed. The form was scanned and sent to medical records for it to be uploaded under ACP tab in Epic. A photocopy was  also placed in the chart to be scanned into EMR. The patient outlined their wishes for the following treatment decisions:  Cardiopulmonary Resuscitation: Do Not Attempt Resuscitation (DNR/No CPR)  Medical Interventions: Full Scope of Treatment: Use intubation, advanced airway interventions, mechanical ventilation, cardioversion as indicated, medical treatment, IV fluids, etc, also provide comfort measures. Transfer to the hospital if indicated  Antibiotics: Antibiotics if indicated  IV Fluids: IV fluids if indicated  Feeding Tube: Left Blank      Length of Stay: 14  Current Medications: Scheduled Meds:   Chlorhexidine Gluconate Cloth  6 each Topical Nightly   docusate  100 mg Per Tube BID   enoxaparin (LOVENOX) injection  40 mg Subcutaneous Q24H   folic acid  1 mg Per Tube Daily   free water  200 mL Per Tube Q4H   insulin aspart  0-9 Units Subcutaneous Q4H   ipratropium-albuterol  3 mL Nebulization Q6H   methylPREDNISolone (SOLU-MEDROL) injection  40 mg Intravenous Daily   multivitamin with minerals  1 tablet Per Tube Daily   pantoprazole (PROTONIX) IV  40 mg Intravenous Q12H   polyethylene glycol  17 g Per Tube Daily   sodium chloride flush  10-40 mL Intracatheter Q12H   thiamine  100 mg Per Tube Daily    Continuous Infusions:  feeding supplement (VITAL AF 1.2 CAL) 25 mL/hr at 05/09/23 1206   fentaNYL infusion INTRAVENOUS 50  mcg/hr (05/09/23 1206)   norepinephrine (LEVOPHED) Adult infusion 2 mcg/min (05/09/23 1206)   piperacillin-tazobactam (ZOSYN)  IV 12.5 mL/hr at 05/09/23 1206   propofol (DIPRIVAN) infusion 45 mcg/kg/min (05/09/23 1319)    PRN Meds: acetaminophen, acetaminophen, docusate, fentaNYL, haloperidol lactate, metoprolol tartrate, midazolam, polyethylene glycol, sodium chloride flush  Physical Exam Constitutional:      Comments: Eyes closed.   Pulmonary:     Comments: On ventilator Skin:    General: Skin is warm and dry.             Vital Signs: BP 127/61   Pulse (!) 51   Temp (!) 97.5 F (36.4 C) (Axillary)   Resp 15   Ht 5' 7.01" (1.702 m)   Wt 77.6 kg   SpO2 98%   BMI 26.79 kg/m  SpO2: SpO2: 98 % O2 Device: O2 Device: Ventilator O2 Flow Rate: O2 Flow Rate (L/min): 15 L/min  Intake/output summary:  Intake/Output Summary (Last 24 hours) at 05/09/2023 1338 Last data filed at 05/09/2023 1206 Gross per 24 hour  Intake 2101.02 ml  Output 2510 ml  Net -408.98 ml   LBM: Last BM Date : 05/08/23 Baseline Weight: Weight: 79 kg Most recent weight: Weight: 77.6 kg     Patient Active Problem List   Diagnosis Date Noted   Demand ischemia (HCC) 04/27/2023   Shock circulatory (HCC) 04/26/2023   Sepsis with encephalopathy (HCC) 04/26/2023   Hypothermia 04/26/2023   Non-traumatic rhabdomyolysis 04/26/2023   AKI (acute kidney injury) (HCC) 04/26/2023   NSTEMI (non-ST elevated myocardial infarction) (HCC) 04/26/2023   Urinary tract infection, acute 04/26/2023   Acute metabolic encephalopathy 04/25/2023   Incarcerated hernia 11/02/2016   Malnutrition of moderate degree 11/02/2016   Incarcerated inguinal hernia    SBO (small bowel obstruction) (HCC)    Incarcerated right inguinal hernia    Seizures (HCC) 06/26/2016   Non-recurrent unilateral inguinal hernia without obstruction or gangrene    Alcohol withdrawal (HCC) 06/21/2016    Palliative Care Assessment & Plan     Recommendations/Plan: Plans for 1 way extubation when medically appropriate.  Please call daughter prior to extubation as she wants to be present.  DNR  Code Status:    Code Status Orders  (From admission, onward)           Start     Ordered   05/09/23 1338  Do not attempt resuscitation (DNR) Pre-Arrest Interventions Desired  (Code Status)  Continuous       Question Answer Comment  If pulseless and not breathing No CPR or chest compressions.   In Pre-Arrest Conditions (Patient Has Pulse and Is Breathing) May intubate, use advanced airway interventions and cardioversion/ACLS medications if appropriate or indicated. May transfer to ICU.   Consent: Discussion documented in EHR or advanced directives reviewed      05/09/23 1337           Code Status History     Date Active Date Inactive Code Status Order ID Comments User Context   04/25/2023 2011 05/09/2023 1337 Full Code [id number]  Jimmye Norman, NP ED   05/15/2017 0931 05/16/2017 0413 Full Code [id number]  Myrna Blazer, MD ED   11/02/2016 0132 11/03/2016 1823 Full Code [id number]  Leafy Ro, MD Inpatient   11/01/2016 2316 11/02/2016 0132 Full Code [id number]  Myrna Blazer, MD ED   06/26/2016 2324 06/28/2016 1556 Full Code [id number]  Oralia Manis, MD Inpatient   06/21/2016 1511 06/25/2016 1929 Full Code [id number]  Enedina Finner, MD Inpatient      Care plan was discussed with CCM  Thank you for allowing the Palliative Medicine Team to assist in the care of this patient.   Morton Stall, NP  Please contact Palliative Medicine Team phone at [phone] for questions and concerns.

## 2023-05-09 NOTE — Progress Notes (Signed)
PHARMACY CONSULT NOTE  Pharmacy Consult for Electrolyte Monitoring and Replacement   Recent Labs: Potassium (mmol/L)  Date Value  05/09/2023 3.7  08/23/2013 3.3 (L)   Magnesium (mg/dL)  Date Value  32/44/0102 1.7  08/23/2013 1.4 (L)   Calcium (mg/dL)  Date Value  72/53/6644 8.8 (L)   Calcium, Total (mg/dL)  Date Value  03/47/4259 9.5   Albumin (g/dL)  Date Value  56/38/7564 2.1 (L)   Phosphorus (mg/dL)  Date Value  33/29/5188 4.1  08/23/2013 2.2 (L)   Sodium (mmol/L)  Date Value  05/09/2023 147 (H)  08/23/2013 128 (L)   Assessment: 68 y/o male with h/o SBO secondary to strangulated inguinal hernia s/p repair 2018, etoh abuse and seizures who is admitted with AMS, aspiration pneumonia, sepsis, AKI and rhabdomyolysis. Pharmacy is asked to follow and replace electrolytes while in CCU  Goal of Therapy:  Electrolytes WNL  Plan:  --Na 147, better today. Continue to monitor --Mg 1.7, magnesium sulfate 2 g IV x 1 --Will re-check electrolytes with AM labs tomorrow  Tressie Ellis 05/09/2023 8:25 AM

## 2023-05-09 NOTE — Plan of Care (Signed)
  Problem: Nutrition: Goal: Adequate nutrition will be maintained Outcome: Progressing   Problem: Elimination: Goal: Will not experience complications related to bowel motility Outcome: Progressing Goal: Will not experience complications related to urinary retention Outcome: Progressing   Problem: Pain Management: Goal: General experience of comfort will improve Outcome: Progressing   Problem: Safety: Goal: Ability to remain free from injury will improve Outcome: Progressing   Problem: Skin Integrity: Goal: Risk for impaired skin integrity will decrease Outcome: Progressing   Problem: Fluid Volume: Goal: Ability to maintain a balanced intake and output will improve Outcome: Progressing   Problem: Metabolic: Goal: Ability to maintain appropriate glucose levels will improve Outcome: Progressing   Problem: Tissue Perfusion: Goal: Adequacy of tissue perfusion will improve Outcome: Progressing   Problem: Education: Goal: Knowledge of General Education information will improve Description: Including pain rating scale, medication(s)/side effects and non-pharmacologic comfort measures Outcome: Not Progressing   Problem: Health Behavior/Discharge Planning: Goal: Ability to manage health-related needs will improve Outcome: Not Progressing   Problem: Clinical Measurements: Goal: Respiratory complications will improve Outcome: Not Progressing   Problem: Activity: Goal: Risk for activity intolerance will decrease Outcome: Not Progressing   Problem: Coping: Goal: Level of anxiety will decrease Outcome: Not Progressing   Problem: Coping: Goal: Ability to adjust to condition or change in health will improve Outcome: Not Progressing   Problem: Health Behavior/Discharge Planning: Goal: Ability to identify and utilize available resources and services will improve Outcome: Not Progressing Goal: Ability to manage health-related needs will improve Outcome: Not Progressing

## 2023-05-10 DIAGNOSIS — G9341 Metabolic encephalopathy: Secondary | ICD-10-CM | POA: Diagnosis not present

## 2023-05-10 DIAGNOSIS — Z7189 Other specified counseling: Secondary | ICD-10-CM | POA: Diagnosis not present

## 2023-05-10 LAB — GLUCOSE, CAPILLARY
Glucose-Capillary: 102 mg/dL — ABNORMAL HIGH (ref 70–99)
Glucose-Capillary: 104 mg/dL — ABNORMAL HIGH (ref 70–99)
Glucose-Capillary: 105 mg/dL — ABNORMAL HIGH (ref 70–99)
Glucose-Capillary: 125 mg/dL — ABNORMAL HIGH (ref 70–99)
Glucose-Capillary: 132 mg/dL — ABNORMAL HIGH (ref 70–99)
Glucose-Capillary: 149 mg/dL — ABNORMAL HIGH (ref 70–99)

## 2023-05-10 LAB — RENAL FUNCTION PANEL
Albumin: 2.2 g/dL — ABNORMAL LOW (ref 3.5–5.0)
Anion gap: 11 (ref 5–15)
BUN: 36 mg/dL — ABNORMAL HIGH (ref 8–23)
CO2: 24 mmol/L (ref 22–32)
Calcium: 8.5 mg/dL — ABNORMAL LOW (ref 8.9–10.3)
Chloride: 108 mmol/L (ref 98–111)
Creatinine, Ser: 1.29 mg/dL — ABNORMAL HIGH (ref 0.61–1.24)
GFR, Estimated: >60 mL/min (ref >60)
Glucose, Bld: 119 mg/dL — ABNORMAL HIGH (ref 70–99)
Phosphorus: 3.1 mg/dL (ref 2.5–4.6)
Potassium: 3.8 mmol/L (ref 3.5–5.1)
Sodium: 143 mmol/L (ref 135–145)

## 2023-05-10 LAB — CBC
HCT: 24.2 % — ABNORMAL LOW (ref 39.0–52.0)
Hemoglobin: 7.7 g/dL — ABNORMAL LOW (ref 13.0–17.0)
MCH: 34.4 pg — ABNORMAL HIGH (ref 26.0–34.0)
MCHC: 31.8 g/dL (ref 30.0–36.0)
MCV: 108 fL — ABNORMAL HIGH (ref 80.0–100.0)
Platelets: 197 10*3/uL (ref 150–400)
RBC: 2.24 MIL/uL — ABNORMAL LOW (ref 4.22–5.81)
RDW: 15.4 % (ref 11.5–15.5)
WBC: 8.3 10*3/uL (ref 4.0–10.5)
nRBC: 0 % (ref 0.0–0.2)

## 2023-05-10 LAB — TROPONIN I (HIGH SENSITIVITY)
Troponin I (High Sensitivity): 51 ng/L — ABNORMAL HIGH (ref <18)
Troponin I (High Sensitivity): 43 ng/L — ABNORMAL HIGH (ref <18)

## 2023-05-10 LAB — TRIGLYCERIDES: Triglycerides: 702 mg/dL — ABNORMAL HIGH (ref <150)

## 2023-05-10 LAB — MAGNESIUM: Magnesium: 2 mg/dL (ref 1.7–2.4)

## 2023-05-10 MED ORDER — HYDROMORPHONE BOLUS VIA INFUSION
0.2500 mg | INTRAVENOUS | Status: DC | PRN
  Administered 2023-05-10 (×4): 1 mg via INTRAVENOUS
  Administered 2023-05-11: 0.25 mg via INTRAVENOUS
  Administered 2023-05-11 (×2): 1 mg via INTRAVENOUS
  Administered 2023-05-11: 0.25 mg via INTRAVENOUS
  Administered 2023-05-12 – 2023-05-13 (×6): 1 mg via INTRAVENOUS

## 2023-05-10 MED ORDER — AMIODARONE IV BOLUS ONLY 150 MG/100ML
150.0000 mg | Freq: Once | INTRAVENOUS | Status: AC
  Administered 2023-05-10: 150 mg via INTRAVENOUS
  Filled 2023-05-10: qty 100

## 2023-05-10 MED ORDER — HYDROMORPHONE HCL-NACL 50-0.9 MG/50ML-% IV SOLN
0.5000 mg/h | INTRAVENOUS | Status: DC
  Administered 2023-05-10: 0.5 mg/h via INTRAVENOUS
  Administered 2023-05-11: 1 mg/h via INTRAVENOUS
  Filled 2023-05-10 (×2): qty 50

## 2023-05-10 MED ORDER — HYDROMORPHONE HCL 1 MG/ML IJ SOLN
0.5000 mg | Freq: Once | INTRAMUSCULAR | Status: AC
  Administered 2023-05-10: 0.5 mg via INTRAVENOUS
  Filled 2023-05-10: qty 1

## 2023-05-10 MED ORDER — DEXMEDETOMIDINE HCL IN NACL 400 MCG/100ML IV SOLN
0.0000 ug/kg/h | INTRAVENOUS | Status: DC
  Administered 2023-05-10: 0.4 ug/kg/h via INTRAVENOUS
  Administered 2023-05-10: 0.8 ug/kg/h via INTRAVENOUS
  Administered 2023-05-10: 0.9 ug/kg/h via INTRAVENOUS
  Administered 2023-05-11: 1 ug/kg/h via INTRAVENOUS
  Administered 2023-05-11: 0.9 ug/kg/h via INTRAVENOUS
  Administered 2023-05-11: 1.2 ug/kg/h via INTRAVENOUS
  Administered 2023-05-11: 1 ug/kg/h via INTRAVENOUS
  Administered 2023-05-12 – 2023-05-13 (×8): 1.2 ug/kg/h via INTRAVENOUS
  Administered 2023-05-13: 0.4 ug/kg/h via INTRAVENOUS
  Administered 2023-05-14: 1.2 ug/kg/h via INTRAVENOUS
  Administered 2023-05-14: 1 ug/kg/h via INTRAVENOUS
  Filled 2023-05-10 (×18): qty 100

## 2023-05-10 MED ORDER — IPRATROPIUM-ALBUTEROL 0.5-2.5 (3) MG/3ML IN SOLN
3.0000 mL | Freq: Three times a day (TID) | RESPIRATORY_TRACT | Status: DC
Start: 2023-05-11 — End: 2023-05-16
  Administered 2023-05-11 – 2023-05-16 (×16): 3 mL via RESPIRATORY_TRACT
  Filled 2023-05-10 (×17): qty 3

## 2023-05-10 MED ORDER — FUROSEMIDE 10 MG/ML IJ SOLN
40.0000 mg | Freq: Once | INTRAMUSCULAR | Status: AC
  Administered 2023-05-10: 40 mg via INTRAVENOUS
  Filled 2023-05-10: qty 4

## 2023-05-10 NOTE — Plan of Care (Signed)

## 2023-05-10 NOTE — Progress Notes (Signed)
NAME:  Curtis [name], MRN:  [id number], DOB:  1956-01-15, LOS: 15 ADMISSION DATE:  04/25/2023, CONSULTATION DATE:  04/25/23 CHIEF COMPLAINT:  Severe Hypothermia   Brief Pt Description / Synopsis:  68 year old male patient with a past medical history of incarcerated inguinal hernia s/p repair, SBO, alcohol withdrawal seizures, EtOH abuse who presented to [facility] on 04/24/2022 for unresponsiveness. He was found to be profoundly hypothermic with temperature measuring 77 F. Subsequently was intubated on 04/24/2022. He was actively rewarmed and started on stress dose steroids. Currently normothermic. Course also complicated by rhabdomyolysis with CK peaking at 6000 and currently downtrending. Finally course complicated by acute hypoxic respiratory failure suspecting aspiration for which she is on Zosyn and doxycycline. Tracheal aspirate w/ Moraxella Cat Patient obstacle to extubation is mental status remains agitated with SAT.  History of Present Illness:  le with significant PMH of incarcerated inguinal hernia s/p repair, SBO, alcohol withdrawal seizure, EtOH abuse who presented to the ED with chief complaints of unresponsiveness.   Per ED report, EMS was dispatched to patient's residence by a neighbor who found the patient unconscious.  On EMS arrival, patient was found unresponsive on floor of bedroom. Neighbor report LKNW was 3 days ago. Patient was initially combative and would tense resist any treatment. EMS report they were unable to obtain a temperature or spo2 due to hypothermia and unable to obtain bp due to combative resistance. patient initial cbg registered at 31 treated with 1mg  glucagon repeat reading were in the upper 20's   ED Course: Initial vital signs showed HR of 55 beats/minute, BP 99/47 mm Hg, the RR 22 breaths/minute, and the oxygen saturation 100% on NRB and a temperature of 25F (25C). Patient intubated in for airway protection. Pertinent Labs/Diagnostics Findings: Na+/ K+:131/4.8   Glucose: 286 BUN/Cr.: 31/1.91 CO2 1, Anion Gap 23, AST/ALT:284/83 WBC: 15.7K/L with neutrophil predominance  Lactic acid: >9.0 COVID PCR: Negative,  troponin: 32  CK 5249 ETOH:177 VBG: pO2 46.2; pCO2 72; pH 6.96;  HCO3 16.2, %O2 Sat 46.6.  CXR> CTH> CTA Chest> CT Abd/pelvis>see results below Medication administered in the WU:[id number] given 30 cc/kg of fluids and started on broad-spectrum antibiotics with Ceftriaxone for suspected sepsis with septic shock.   Pertinent  Medical History   Past Medical History:  Diagnosis Date   EtOH dependence (HCC)    History of echocardiogram    a. 04/2023 Echo: EF >55%, mildly reduced RV fxn, triv MR.   Incarcerated inguinal hernia    a. 10/2016 s/p repair.    Micro Data:  1/9: SARS-CoV-2/RSV/influenza PCR>> negative 1/9: HIV screen>> nonreactive 1/9: Blood culture x 2>> no growth 1/9: MRSA PCR>> negative 1/10: Respiratory viral panel>> negative 1/10: Mycoplasma pneumoniae IgM>> less than 770 1/10: Strep pneumo and Legionella urinary antigens>> negative 1/10: Tracheal aspirate>> Proteus mirabilis, Moraxella Catarrhalis (beta lactamase +) 1/11: Acute viral hepatitis panel>> nonreactive 1/13: Blood culture x 2>> no growth 1/22: RVP>> negative  Antimicrobials:   Anti-infectives (From admission, onward)    Start     Dose/Rate Route Frequency Ordered Stop   05/08/23 1400  piperacillin-tazobactam (ZOSYN) IVPB 3.375 g        3.375 g 12.5 mL/hr over 240 Minutes Intravenous Every 8 hours 05/08/23 1012     05/08/23 1000  Ampicillin-Sulbactam (UNASYN) 3 g in sodium chloride 0.9 % 100 mL IVPB  Status:  Discontinued        3 g 200 mL/hr over 30 Minutes Intravenous Every 6 hours 05/08/23 0803 05/08/23 1011   05/05/23 0900  cefTRIAXone (ROCEPHIN) 2 g in sodium chloride 0.9 % 100 mL IVPB        2 g 200 mL/hr over 30 Minutes Intravenous  Once 05/04/23 1202 05/05/23 1312   05/01/23 1030  cefTRIAXone (ROCEPHIN) 2 g in sodium chloride 0.9 % 100 mL IVPB         2 g 200 mL/hr over 30 Minutes Intravenous Daily 05/01/23 0932 05/04/23 1913   05/01/23 1000  cefTRIAXone (ROCEPHIN) 1 g in sodium chloride 0.9 % 100 mL IVPB  Status:  Discontinued        1 g 200 mL/hr over 30 Minutes Intravenous Every 24 hours 05/01/23 0756 05/01/23 0932   04/27/23 1400  piperacillin-tazobactam (ZOSYN) IVPB 3.375 g  Status:  Discontinued        3.375 g 12.5 mL/hr over 240 Minutes Intravenous Every 8 hours 04/27/23 0744 05/01/23 0756   04/27/23 1000  doxycycline (VIBRAMYCIN) 100 mg in dextrose 5 % 250 mL IVPB        100 mg 125 mL/hr over 120 Minutes Intravenous Every 12 hours 04/26/23 1358 04/29/23 1900   04/26/23 2200  piperacillin-tazobactam (ZOSYN) IVPB 4.5 g  Status:  Discontinued        4.5 g 200 mL/hr over 30 Minutes Intravenous Every 8 hours 04/26/23 1921 04/27/23 0744   04/26/23 1000  azithromycin (ZITHROMAX) 500 mg in sodium chloride 0.9 % 250 mL IVPB  Status:  Discontinued        500 mg 250 mL/hr over 60 Minutes Intravenous Every 24 hours 04/26/23 0401 04/26/23 1357   04/26/23 0600  piperacillin-tazobactam (ZOSYN) IVPB 3.375 g  Status:  Discontinued        3.375 g 12.5 mL/hr over 240 Minutes Intravenous Every 8 hours 04/26/23 0407 04/26/23 1921   04/25/23 1915  cefTRIAXone (ROCEPHIN) 2 g in sodium chloride 0.9 % 100 mL IVPB        2 g 200 mL/hr over 30 Minutes Intravenous  Once 04/25/23 1914 04/25/23 2013       Significant Hospital Events: Including procedures, antibiotic start and stop dates in addition to other pertinent events   04/24/2022 - Intubated, admit to ICU extremely hypotensive.  04/28/2022 - Started on Librium taper and Seroquel BID.  04/29/2022 - Remains with significant agitation on SAT and unable to proceed with extubation.  04/30/2022 Mental status with significant improvement. Following commands. Hgb trending down now at 7.3g/dl. Stools brown. NG tube to suction and no signs of bleeding.  05/01/2022 1pRBC given. Improved mental status.   05/02/2022 Patient H&H stable. Did well on SBT however mentation remains extremley poor with significant thick secretions therefore held off on extubation.  05/03/2022 Remains with poor mental status, not doing well with SBT. My concern is that we are looking at a trach if no improvement in mental status in the next 48hrs.  05/05/2023: Initially tolerated WUA and SBT, however with BM and turning, agitated and severe hypoxia. 05/06/2023: On minimal vent settings, FAILED SBT.  Increase Seroquel to help with agitation.  Decrease free water flushes to 200 q4h. 05/07/2023: On minimal vent settings, perform SBT as tolerated. Diurese with 60 mg IV Lasix x1 dose.  EXTUBATED. 05/08/2023: Pt failed extubation due to worsening acute respiratory failure secondary to excessive secretions requiring reintubation. Pt febrile with worsening leukocytosis, unasyn started for aspiration pneumonia  05/09/2023: Pt remains mechanically intubated FiO2 55%/PEEP 5.  Currently sedated with precedex and low dose fentanyl gtt.  Pt bradycardic will discontinue precedex gtt and restart propofol gtt  05/10/2023: On 50% FiO2.  Diurese with 40 mg IV Lasix x1 dose.  Triglycerides 702, d/c Propofol and change to Dilaudid, Precedex with prn versed pushes.  Developed A.fib with RVR, converted back to NSR with Amiodarone bous.  Interim History / Subjective:  As outlined above under significant events   Objective   Blood pressure (!) 108/49, pulse 61, temperature (!) 97.5 F (36.4 C), temperature source Axillary, resp. rate 15, height 5' 7.01" (1.702 m), weight 74.7 kg, SpO2 100%.    Vent Mode: PRVC FiO2 (%):  [40 %-55 %] 40 % Set Rate:  [15 bmp] 15 bmp Vt Set:  [500 mL] 500 mL PEEP:  [5 cmH20] 5 cmH20 Pressure Support:  [8 cmH20] 8 cmH20 Plateau Pressure:  [15 cmH20] 15 cmH20   Intake/Output Summary (Last 24 hours) at 05/10/2023 2956 Last data filed at 05/10/2023 2130 Gross per 24 hour  Intake 2141.15 ml  Output 1205 ml  Net  936.15 ml   Filed Weights   05/08/23 0500 05/09/23 0500 05/10/23 0444  Weight: 76.2 kg 77.6 kg 74.7 kg    Examination: General: Acutely ill-appearing male, laying in bed, mechanically intubated  HENT: Atraumatic, normocephalic, neck supple, no JVD, orally intubated  Lungs: Rhonchi throughout, even, non labored, synchronous with vent Cardiovascular: Sinus rhythm s1s2, no m/r/g, 1+ radial/1+ distal pulses  Abdomen: +BS x4, soft, obese non distended  Extremities: Normal bulk and tone, no deformities Neuro: Sedated, not following commands but purposeful movement present, PERRL  GU: Indwelling foley catheter draining yellow urine   Resolved Hospital Problem list   Rhabdomyolysis  Thrombocytopenia   Assessment & Plan:   #Acute metabolic encephalopathy #Sedation needs in setting of mechanical ventilation - Maintain RASS goal of 0 to -1 - PAD protocol to maintain RASS goal: Change to Dilaudid and Precedex given Hypertriglyceridemia  - Avoid sedating medications as able - Daily wake up assessment - Continue thiamine, MVI, folic acid  #Septic shock ~ RESOLVED #Intermittent atrial fibrillation with rvr  Echocardiogram 04/26/23: LVEF >55%, diastolic function unable to be evaluated, RV systolic function mildly reduced, RV size is normal - Continuous telemetry monitoring - Maintain map 65 or higher  - Prn metoprolol for sustained hr >120 bpm - Troponin peaked at 885 ~ will recheck Troponin -TSH normal at 1.2 on 04/26/23 -Give 150 mg Amiodarone bolus ~ converted back to NSR  #Acute hypoxic respiratory failure  #Aspiration pneumonia (Trach aspirate with Moraxella Ctarrhalis and proteus Mirabilis, gram stain w/ Gram + Cocci) #Mechanical intubation   - Full vent support, implement lung protective strategies - Plateau pressures less than 30 cm H20 - Wean FiO2 & PEEP as tolerated to maintain O2 sats >92% - Follow intermittent Chest X-ray & ABG as needed - Spontaneous Breathing Trials when  respiratory parameters met and mental status permits - Implement VAP Bundle - Scheduled bronchodilator therapy  - IV steroids  - Completed ABX as outlined above - Aggressive pulmonary hygiene - Diuresis as BP and renal function permits ~ will give 40 mg IV Lasix x1 dose on 1/24  #Aspiration pneumonia ((Trach aspirate with Moraxella Ctarrhalis and proteus Mirabilis, gram stain w/ Gram + Cocci ) ~ TREATED -Monitor fever curve -Trend WBC's & Procalcitonin -Follow cultures as above -Continue empiric Zosyn pending cultures & sensitivities  #AKI~IMPROVING #Hypernatremia~RESVOLED  - Trend BMP  - Replace electrolytes as indicated  - Strict I&O's  - Continue free water flush to 200 ml q4hrs  - Replace electrolytes as indicated~pharmacy following for assistance with electrolyte replacement  #  Anemia without obvious signs of bleeding  CT Abd/Pelvis 05/02/2023: without any sign of intraperitoneal or retroperitoneal hematoma - Trend CBC - VTE px: subcutaneous lovenox  - Monitor for s/sx of bleeding  - Transfuse for Hgb <8 - GI consulted, appreciate input: No indication for EGD currently, continue PPI BID for SUP, if further drop in Hgb recommends Tagged RBC scan  #Hyperglycemia  #Hypoglycemia~resolved   - CBG's q4hrs  - Sensitive SSI  - Follow hyper/hypoglycemic protocol    Best Practice (right click and "Reselect all SmartList Selections" daily)   Diet/type: tubefeeds DVT prophylaxis: LMWH GI prophylaxis: PPI Lines: Yes and still needed  Foley: Yes and still needed  Code Status:  DNR Last date of multidisciplinary goals of care discussion [05/10/23]  1/24: Will update pts daughter when she arrives at bedside   Labs   CBC: Recent Labs  Lab 05/06/23 0428 05/07/23 0445 05/08/23 0248 05/09/23 0421 05/09/23 1520 05/10/23 0418  WBC 15.6* 13.3* 21.5* 9.3  --  8.3  HGB 7.5* 7.2* 7.8* 7.0* 7.4* 7.7*  HCT 23.9* 22.9* 25.6* 23.1* 22.6* 24.2*  MCV 105.3* 103.6* 108.9* 110.0*  --   108.0*  PLT 205 193 256 207  --  197    Basic Metabolic Panel: Recent Labs  Lab 05/06/23 0428 05/07/23 0445 05/08/23 0248 05/08/23 1934 05/09/23 0421 05/10/23 0418  NA 149* 147* 151*  --  147* 143  K 4.0 4.1 4.0 4.4 3.7 3.8  CL 119* 116* 118*  --  111 108  CO2 22 21* 23  --  25 24  GLUCOSE 132* 146* 99  --  163* 119*  BUN 66* 60* 55*  --  47* 36*  CREATININE 1.97* 1.84* 1.76*  --  1.78* 1.29*  CALCIUM 8.3* 8.7* 9.2  --  8.8* 8.5*  MG 2.1  --  2.0 1.9 1.7 2.0  PHOS  --  4.3 4.6 5.1* 4.1 3.1   GFR: Estimated Creatinine Clearance: 50.5 mL/min (A) (by C-G formula based on SCr of 1.29 mg/dL (H)). Recent Labs  Lab 05/07/23 0445 05/08/23 0248 05/08/23 1213 05/09/23 0421 05/10/23 0418  PROCALCITON  --   --  0.35  --   --   WBC 13.3* 21.5*  --  9.3 8.3  LATICACIDVEN  --   --  0.9  --   --     Liver Function Tests: Recent Labs  Lab 05/07/23 0445 05/08/23 0248 05/09/23 0421 05/10/23 0418  ALBUMIN 2.0* 2.5* 2.1* 2.2*   No results for input(s): "LIPASE", "AMYLASE" in the last 168 hours. No results for input(s): "AMMONIA" in the last 168 hours.  ABG    Component Value Date/Time   PHART 7.41 05/08/2023 0947   PCO2ART 39 05/08/2023 0947   PO2ART 94 05/08/2023 0947   HCO3 24.7 05/08/2023 0947   ACIDBASEDEF 0.6 05/08/2023 0430   O2SAT 99.6 05/08/2023 0947     Coagulation Profile: No results for input(s): "INR", "PROTIME" in the last 168 hours.  Cardiac Enzymes: No results for input(s): "CKTOTAL", "CKMB", "CKMBINDEX", "TROPONINI" in the last 168 hours.  HbA1C: Hgb A1c MFr Bld  Date/Time Value Ref Range Status  04/27/2023 04:24 AM 5.1 4.8 - 5.6 % Final    Comment:    (NOTE) Pre diabetes:          5.7%-6.4%  Diabetes:              >6.4%  Glycemic control for   <7.0% adults with diabetes   04/26/2023 03:25 AM 5.1 4.8 -  5.6 % Final    Comment:    (NOTE) Pre diabetes:          5.7%-6.4%  Diabetes:              >6.4%  Glycemic control for   <7.0% adults  with diabetes     CBG: Recent Labs  Lab 05/09/23 1632 05/09/23 1928 05/09/23 2336 05/10/23 0423 05/10/23 0727  GLUCAP 146* 167* 138* 104* 102*    Review of Systems:   Unable to assess due to AMS/Intubation/sedation    Past Medical History:  He,  has a past medical history of EtOH dependence (HCC), History of echocardiogram, and Incarcerated inguinal hernia.   Surgical History:   Past Surgical History:  Procedure Laterality Date   INGUINAL HERNIA REPAIR Right 11/01/2016   Procedure: HERNIA REPAIR INGUINAL INCARCERATED;  Surgeon: Leafy Ro, MD;  Location: [facility] ORS;  Service: General;  Laterality: Right;   NO PAST SURGERIES       Social History:   reports that he has been smoking cigarettes. His smokeless tobacco use includes chew. He reports current alcohol use of about 126.0 standard drinks of alcohol per week. He reports that he does not use drugs.   Family History:  His Family history is unknown by patient.   Allergies Allergies  Allergen Reactions   Robinul [Glycopyrrolate] Other (See Comments)    Arrhythmia's     Home Medications  Prior to Admission medications   Not on File     Critical care time: 40 minutes     Harlon Ditty, AGACNP-BC [facility] Prefer epic messenger for cross cover needs If after hours, please call E-link

## 2023-05-10 NOTE — Progress Notes (Signed)
PHARMACY CONSULT NOTE  Pharmacy Consult for Electrolyte Monitoring and Replacement   Recent Labs: Potassium (mmol/L)  Date Value  05/10/2023 3.8  08/23/2013 3.3 (L)   Magnesium (mg/dL)  Date Value  40/98/1191 2.0  08/23/2013 1.4 (L)   Calcium (mg/dL)  Date Value  47/82/9562 8.5 (L)   Calcium, Total (mg/dL)  Date Value  13/11/6576 9.5   Albumin (g/dL)  Date Value  46/96/2952 2.2 (L)   Phosphorus (mg/dL)  Date Value  84/13/2440 3.1  08/23/2013 2.2 (L)   Sodium (mmol/L)  Date Value  05/10/2023 143  08/23/2013 128 (L)   Assessment: 68 y/o male with h/o SBO secondary to strangulated inguinal hernia s/p repair 2018, etoh abuse and seizures who is admitted with AMS, aspiration pneumonia, sepsis, AKI and rhabdomyolysis. Pharmacy is asked to follow and replace electrolytes while in CCU  Goal of Therapy:  Electrolytes WNL  Plan:  --No electrolyte replacement indicated at this time --Will re-check electrolytes with AM labs tomorrow  Tressie Ellis 05/10/2023 7:35 AM

## 2023-05-10 NOTE — Progress Notes (Addendum)
Daily Progress Note   Patient Name: Curtis [name]       Date: 05/10/2023 DOB: March 04, 1956  Age: 68 y.o. MRN#: [id number] Attending Physician: Erin Fulling, MD Primary Care Physician: Patient, No Pcp Per Admit Date: 04/25/2023  Reason for Consultation/Follow-up: Establishing goals of care  Subjective: Notes and labs reviewed.  In to see patient.  He is currently still on ventilator support, no family at bedside.  Plans in place for one-way extubation by CCM most likely on Monday; support patient if he does well, shift to comfort if he does not.  No tracheostomy. Patient now with DNR status.   Length of Stay: 15  Current Medications: Scheduled Meds:   Chlorhexidine Gluconate Cloth  6 each Topical Nightly   docusate  100 mg Per Tube BID   enoxaparin (LOVENOX) injection  40 mg Subcutaneous Q24H   folic acid  1 mg Per Tube Daily   free water  200 mL Per Tube Q4H    HYDROmorphone (DILAUDID) injection  0.5 mg Intravenous Once   insulin aspart  0-9 Units Subcutaneous Q4H   ipratropium-albuterol  3 mL Nebulization Q6H   methylPREDNISolone (SOLU-MEDROL) injection  40 mg Intravenous Daily   multivitamin with minerals  1 tablet Per Tube Daily   mouth rinse  15 mL Mouth Rinse Q2H   pantoprazole (PROTONIX) IV  40 mg Intravenous Q12H   polyethylene glycol  17 g Per Tube Daily   sodium chloride flush  10-40 mL Intracatheter Q12H   thiamine  100 mg Per Tube Daily    Continuous Infusions:  dexmedetomidine (PRECEDEX) IV infusion     feeding supplement (VITAL AF 1.2 CAL) 65 mL/hr at 05/10/23 0437   HYDROmorphone     norepinephrine (LEVOPHED) Adult infusion Stopped (05/10/23 0415)   piperacillin-tazobactam (ZOSYN)  IV 3.375 g (05/10/23 0627)    PRN Meds: acetaminophen, acetaminophen, docusate,  haloperidol lactate, HYDROmorphone, metoprolol tartrate, midazolam, mouth rinse, polyethylene glycol, sodium chloride flush  Physical Exam Constitutional:      Comments: Eyes closed  Pulmonary:     Comments: On Ventilator            Vital Signs: BP (!) 122/52   Pulse 70   Temp (!) 97.5 F (36.4 C) (Axillary)   Resp 19   Ht 5' 7.01" (1.702 m)   Wt 74.7  kg   SpO2 100%   BMI 25.79 kg/m  SpO2: SpO2: 100 % O2 Device: O2 Device: Ventilator O2 Flow Rate: O2 Flow Rate (L/min): 15 L/min  Intake/output summary:  Intake/Output Summary (Last 24 hours) at 05/10/2023 1220 Last data filed at 05/10/2023 1100 Gross per 24 hour  Intake 1822.79 ml  Output 2395 ml  Net -572.21 ml   LBM: Last BM Date : 05/09/23 Baseline Weight: Weight: 79 kg Most recent weight: Weight: 74.7 kg        Patient Active Problem List   Diagnosis Date Noted   Demand ischemia (HCC) 04/27/2023   Shock circulatory (HCC) 04/26/2023   Sepsis with encephalopathy (HCC) 04/26/2023   Hypothermia 04/26/2023   Non-traumatic rhabdomyolysis 04/26/2023   AKI (acute kidney injury) (HCC) 04/26/2023   NSTEMI (non-ST elevated myocardial infarction) (HCC) 04/26/2023   Urinary tract infection, acute 04/26/2023   Acute metabolic encephalopathy 04/25/2023   Incarcerated hernia 11/02/2016   Malnutrition of moderate degree 11/02/2016   Incarcerated inguinal hernia    SBO (small bowel obstruction) (HCC)    Incarcerated right inguinal hernia    Seizures (HCC) 06/26/2016   Non-recurrent unilateral inguinal hernia without obstruction or gangrene    Alcohol withdrawal (HCC) 06/21/2016    Palliative Care Assessment & Plan   Recommendations/Plan: Plans in place for one-way extubation by CCM most likely on Monday.  Support patient if he does well, shift to comfort if he does not.  No tracheostomy. DNR Please reach out if needs arise.  Code Status:    Code Status Orders  (From admission, onward)           Start      Ordered   05/09/23 1338  Do not attempt resuscitation (DNR) Pre-Arrest Interventions Desired  (Code Status)  Continuous       Question Answer Comment  If pulseless and not breathing No CPR or chest compressions.   In Pre-Arrest Conditions (Patient Has Pulse and Is Breathing) May intubate, use advanced airway interventions and cardioversion/ACLS medications if appropriate or indicated. May transfer to ICU.   Consent: Discussion documented in EHR or advanced directives reviewed      05/09/23 1337           Code Status History     Date Active Date Inactive Code Status Order ID Comments User Context   04/25/2023 2011 05/09/2023 1337 Full Code [id number]  Jimmye Norman, NP ED   05/15/2017 0931 05/16/2017 0413 Full Code [id number]  Myrna Blazer, MD ED   11/02/2016 0132 11/03/2016 1823 Full Code [id number]  Leafy Ro, MD Inpatient   11/01/2016 2316 11/02/2016 0132 Full Code [id number]  Myrna Blazer, MD ED   06/26/2016 2324 06/28/2016 1556 Full Code [id number]  Oralia Manis, MD Inpatient   06/21/2016 1511 06/25/2016 1929 Full Code [id number]  Enedina Finner, MD Inpatient      Care plan was discussed with CCM  Thank you for allowing the Palliative Medicine Team to assist in the care of this patient.    Morton Stall, NP  Please contact Palliative Medicine Team phone at [phone] for questions and concerns.

## 2023-05-11 ENCOUNTER — Inpatient Hospital Stay: Payer: 59

## 2023-05-11 DIAGNOSIS — G9341 Metabolic encephalopathy: Secondary | ICD-10-CM | POA: Diagnosis not present

## 2023-05-11 DIAGNOSIS — N179 Acute kidney failure, unspecified: Secondary | ICD-10-CM | POA: Diagnosis not present

## 2023-05-11 DIAGNOSIS — J9602 Acute respiratory failure with hypercapnia: Secondary | ICD-10-CM | POA: Diagnosis not present

## 2023-05-11 DIAGNOSIS — J9601 Acute respiratory failure with hypoxia: Secondary | ICD-10-CM | POA: Diagnosis not present

## 2023-05-11 LAB — GLUCOSE, CAPILLARY
Glucose-Capillary: 112 mg/dL — ABNORMAL HIGH (ref 70–99)
Glucose-Capillary: 120 mg/dL — ABNORMAL HIGH (ref 70–99)
Glucose-Capillary: 120 mg/dL — ABNORMAL HIGH (ref 70–99)
Glucose-Capillary: 135 mg/dL — ABNORMAL HIGH (ref 70–99)
Glucose-Capillary: 136 mg/dL — ABNORMAL HIGH (ref 70–99)
Glucose-Capillary: 164 mg/dL — ABNORMAL HIGH (ref 70–99)

## 2023-05-11 LAB — CBC
HCT: 23.9 % — ABNORMAL LOW (ref 39.0–52.0)
Hemoglobin: 7.3 g/dL — ABNORMAL LOW (ref 13.0–17.0)
MCH: 32.6 pg (ref 26.0–34.0)
MCHC: 30.5 g/dL (ref 30.0–36.0)
MCV: 106.7 fL — ABNORMAL HIGH (ref 80.0–100.0)
Platelets: 210 10*3/uL (ref 150–400)
RBC: 2.24 MIL/uL — ABNORMAL LOW (ref 4.22–5.81)
RDW: 15 % (ref 11.5–15.5)
WBC: 6.8 10*3/uL (ref 4.0–10.5)
nRBC: 0 % (ref 0.0–0.2)

## 2023-05-11 LAB — RENAL FUNCTION PANEL
Albumin: 2.2 g/dL — ABNORMAL LOW (ref 3.5–5.0)
Anion gap: 11 (ref 5–15)
BUN: 33 mg/dL — ABNORMAL HIGH (ref 8–23)
CO2: 27 mmol/L (ref 22–32)
Calcium: 8.6 mg/dL — ABNORMAL LOW (ref 8.9–10.3)
Chloride: 108 mmol/L (ref 98–111)
Creatinine, Ser: 1.24 mg/dL (ref 0.61–1.24)
GFR, Estimated: >60 mL/min (ref >60)
Glucose, Bld: 151 mg/dL — ABNORMAL HIGH (ref 70–99)
Phosphorus: 3.1 mg/dL (ref 2.5–4.6)
Potassium: 3.4 mmol/L — ABNORMAL LOW (ref 3.5–5.1)
Sodium: 146 mmol/L — ABNORMAL HIGH (ref 135–145)

## 2023-05-11 LAB — PREPARE RBC (CROSSMATCH)

## 2023-05-11 MED ORDER — GLYCOPYRROLATE 0.2 MG/ML IJ SOLN
0.2000 mg | INTRAMUSCULAR | Status: DC | PRN
  Administered 2023-05-12 – 2023-05-13 (×2): 0.2 mg via INTRAVENOUS
  Filled 2023-05-11 (×2): qty 1

## 2023-05-11 MED ORDER — POTASSIUM CHLORIDE 10 MEQ/100ML IV SOLN
10.0000 meq | INTRAVENOUS | Status: AC
  Administered 2023-05-11 (×2): 10 meq via INTRAVENOUS
  Filled 2023-05-11 (×2): qty 100

## 2023-05-11 NOTE — Plan of Care (Signed)
  Problem: Education: Goal: Knowledge of General Education information will improve Description: Including pain rating scale, medication(s)/side effects and non-pharmacologic comfort measures Outcome: Not Progressing   Problem: Clinical Measurements: Goal: Diagnostic test results will improve Outcome: Not Progressing Goal: Respiratory complications will improve Outcome: Not Progressing   Problem: Activity: Goal: Risk for activity intolerance will decrease Outcome: Not Progressing   Problem: Coping: Goal: Level of anxiety will decrease Outcome: Not Progressing

## 2023-05-11 NOTE — Progress Notes (Signed)
NAME:  Curtis [name], MRN:  [id number], DOB:  1955-10-15, LOS: 16 ADMISSION DATE:  04/25/2023, CONSULTATION DATE:  04/25/23 CHIEF COMPLAINT:  Severe Hypothermia   Brief Pt Description / Synopsis:  68 year old male patient with a past medical history of incarcerated inguinal hernia s/p repair, SBO, alcohol withdrawal seizures, EtOH abuse who presented to [facility] on 04/24/2022 for unresponsiveness. He was found to be profoundly hypothermic with temperature measuring 77 F. Subsequently was intubated on 04/24/2022. He was actively rewarmed and started on stress dose steroids. Currently normothermic. Course also complicated by rhabdomyolysis with CK peaking at 6000 and currently downtrending. Finally course complicated by acute hypoxic respiratory failure suspecting aspiration for which she is on Zosyn and doxycycline. Tracheal aspirate w/ Moraxella Cat Patient obstacle to extubation is mental status remains agitated with SAT.  History of Present Illness:  le with significant PMH of incarcerated inguinal hernia s/p repair, SBO, alcohol withdrawal seizure, EtOH abuse who presented to the ED with chief complaints of unresponsiveness.   Per ED report, EMS was dispatched to patient's residence by a neighbor who found the patient unconscious.  On EMS arrival, patient was found unresponsive on floor of bedroom. Neighbor report LKNW was 3 days ago. Patient was initially combative and would tense resist any treatment. EMS report they were unable to obtain a temperature or spo2 due to hypothermia and unable to obtain bp due to combative resistance. patient initial cbg registered at 50 treated with 1mg  glucagon repeat reading were in the upper 20's   ED Course: Initial vital signs showed HR of 55 beats/minute, BP 99/47 mm Hg, the RR 22 breaths/minute, and the oxygen saturation 100% on NRB and a temperature of 50F (25C). Patient intubated in for airway protection. Pertinent Labs/Diagnostics Findings: Na+/ K+:131/4.8   Glucose: 286 BUN/Cr.: 31/1.91 CO2 1, Anion Gap 23, AST/ALT:284/83 WBC: 15.7K/L with neutrophil predominance  Lactic acid: >9.0 COVID PCR: Negative,  troponin: 32  CK 5249 ETOH:177 VBG: pO2 46.2; pCO2 72; pH 6.96;  HCO3 16.2, %O2 Sat 46.6.  CXR> CTH> CTA Chest> CT Abd/pelvis>see results below Medication administered in the MW:[id number] given 30 cc/kg of fluids and started on broad-spectrum antibiotics with Ceftriaxone for suspected sepsis with septic shock.   Pertinent  Medical History   Past Medical History:  Diagnosis Date   EtOH dependence (HCC)    History of echocardiogram    a. 04/2023 Echo: EF >55%, mildly reduced RV fxn, triv MR.   Incarcerated inguinal hernia    a. 10/2016 s/p repair.    Micro Data:  1/9: SARS-CoV-2/RSV/influenza PCR>> negative 1/9: HIV screen>> nonreactive 1/9: Blood culture x 2>> no growth 1/9: MRSA PCR>> negative 1/10: Respiratory viral panel>> negative 1/10: Mycoplasma pneumoniae IgM>> less than 770 1/10: Strep pneumo and Legionella urinary antigens>> negative 1/10: Tracheal aspirate>> Proteus mirabilis, Moraxella Catarrhalis (beta lactamase +) 1/11: Acute viral hepatitis panel>> nonreactive 1/13: Blood culture x 2>> no growth 1/22: RVP>> negative  Antimicrobials:   Anti-infectives (From admission, onward)    Start     Dose/Rate Route Frequency Ordered Stop   05/08/23 1400  piperacillin-tazobactam (ZOSYN) IVPB 3.375 g        3.375 g 12.5 mL/hr over 240 Minutes Intravenous Every 8 hours 05/08/23 1012     05/08/23 1000  Ampicillin-Sulbactam (UNASYN) 3 g in sodium chloride 0.9 % 100 mL IVPB  Status:  Discontinued        3 g 200 mL/hr over 30 Minutes Intravenous Every 6 hours 05/08/23 0803 05/08/23 1011   05/05/23 0900  cefTRIAXone (ROCEPHIN) 2 g in sodium chloride 0.9 % 100 mL IVPB        2 g 200 mL/hr over 30 Minutes Intravenous  Once 05/04/23 1202 05/05/23 1312   05/01/23 1030  cefTRIAXone (ROCEPHIN) 2 g in sodium chloride 0.9 % 100 mL IVPB         2 g 200 mL/hr over 30 Minutes Intravenous Daily 05/01/23 0932 05/04/23 1913   05/01/23 1000  cefTRIAXone (ROCEPHIN) 1 g in sodium chloride 0.9 % 100 mL IVPB  Status:  Discontinued        1 g 200 mL/hr over 30 Minutes Intravenous Every 24 hours 05/01/23 0756 05/01/23 0932   04/27/23 1400  piperacillin-tazobactam (ZOSYN) IVPB 3.375 g  Status:  Discontinued        3.375 g 12.5 mL/hr over 240 Minutes Intravenous Every 8 hours 04/27/23 0744 05/01/23 0756   04/27/23 1000  doxycycline (VIBRAMYCIN) 100 mg in dextrose 5 % 250 mL IVPB        100 mg 125 mL/hr over 120 Minutes Intravenous Every 12 hours 04/26/23 1358 04/29/23 1900   04/26/23 2200  piperacillin-tazobactam (ZOSYN) IVPB 4.5 g  Status:  Discontinued        4.5 g 200 mL/hr over 30 Minutes Intravenous Every 8 hours 04/26/23 1921 04/27/23 0744   04/26/23 1000  azithromycin (ZITHROMAX) 500 mg in sodium chloride 0.9 % 250 mL IVPB  Status:  Discontinued        500 mg 250 mL/hr over 60 Minutes Intravenous Every 24 hours 04/26/23 0401 04/26/23 1357   04/26/23 0600  piperacillin-tazobactam (ZOSYN) IVPB 3.375 g  Status:  Discontinued        3.375 g 12.5 mL/hr over 240 Minutes Intravenous Every 8 hours 04/26/23 0407 04/26/23 1921   04/25/23 1915  cefTRIAXone (ROCEPHIN) 2 g in sodium chloride 0.9 % 100 mL IVPB        2 g 200 mL/hr over 30 Minutes Intravenous  Once 04/25/23 1914 04/25/23 2013       Significant Hospital Events: Including procedures, antibiotic start and stop dates in addition to other pertinent events   04/24/2022 - Intubated, admit to ICU extremely hypotensive.  04/28/2022 - Started on Librium taper and Seroquel BID.  04/29/2022 - Remains with significant agitation on SAT and unable to proceed with extubation.  04/30/2022 Mental status with significant improvement. Following commands. Hgb trending down now at 7.3g/dl. Stools brown. NG tube to suction and no signs of bleeding.  05/01/2022 1pRBC given. Improved mental status.   05/02/2022 Patient H&H stable. Did well on SBT however mentation remains extremley poor with significant thick secretions therefore held off on extubation.  05/03/2022 Remains with poor mental status, not doing well with SBT. My concern is that we are looking at a trach if no improvement in mental status in the next 48hrs.  05/05/2023: Initially tolerated WUA and SBT, however with BM and turning, agitated and severe hypoxia. 05/06/2023: On minimal vent settings, FAILED SBT.  Increase Seroquel to help with agitation.  Decrease free water flushes to 200 q4h. 05/07/2023: On minimal vent settings, perform SBT as tolerated. Diurese with 60 mg IV Lasix x1 dose.  EXTUBATED. 05/08/2023: Pt failed extubation due to worsening acute respiratory failure secondary to excessive secretions requiring reintubation. Pt febrile with worsening leukocytosis, unasyn started for aspiration pneumonia  05/09/2023: Pt remains mechanically intubated FiO2 55%/PEEP 5.  Currently sedated with precedex and low dose fentanyl gtt.  Pt bradycardic will discontinue precedex gtt and restart propofol gtt  05/10/2023: On 50% FiO2.  Diurese with 40 mg IV Lasix x1 dose.  Triglycerides 702, d/c Propofol and change to Dilaudid, Precedex with prn versed pushes.  Developed A.fib with RVR, converted back to NSR with Amiodarone bous.  Interim History / Subjective:  Remains critically ill Remains intubated Requires VENT support for survival Difficult wean from vent   Vent Mode: PRVC FiO2 (%):  [35 %-40 %] 35 % Set Rate:  [15 bmp] 15 bmp Vt Set:  [500 mL] 500 mL PEEP:  [5 cmH20] 5 cmH20 Plateau Pressure:  [15 cmH20-18 cmH20] 18 cmH20   Objective   Blood pressure (!) 128/57, pulse (!) 53, temperature 98.2 F (36.8 C), temperature source Oral, resp. rate 15, height 5' 7.01" (1.702 m), weight 76.7 kg, SpO2 99%.    Vent Mode: PRVC FiO2 (%):  [35 %-40 %] 35 % Set Rate:  [15 bmp] 15 bmp Vt Set:  [500 mL] 500 mL PEEP:  [5 cmH20] 5  cmH20 Plateau Pressure:  [15 cmH20-18 cmH20] 18 cmH20   Intake/Output Summary (Last 24 hours) at 05/11/2023 0711 Last data filed at 05/11/2023 0551 Gross per 24 hour  Intake 2770.71 ml  Output 3170 ml  Net -399.29 ml   Filed Weights   05/09/23 0500 05/10/23 0444 05/11/23 0357  Weight: 77.6 kg 74.7 kg 76.7 kg     REVIEW OF SYSTEMS  PATIENT IS UNABLE TO PROVIDE COMPLETE REVIEW OF SYSTEMS DUE TO SEVERE CRITICAL ILLNESS   PHYSICAL EXAMINATION:  GENERAL:critically ill appearing, +resp distress EYES: Pupils equal, round, reactive to light.  No scleral icterus.  MOUTH: Moist mucosal membrane. INTUBATED NECK: Supple.  PULMONARY: Lungs clear to auscultation, +rhonchi, +wheezing CARDIOVASCULAR: S1 and S2.  Regular rate and rhythm GASTROINTESTINAL: Soft, nontender, -distended. Positive bowel sounds.  MUSCULOSKELETAL: No swelling, clubbing, or edema.  NEUROLOGIC: obtunded,sedated SKIN:normal, warm to touch, Capillary refill delayed  Pulses present bilaterally  Resolved Hospital Problem list   Rhabdomyolysis  Thrombocytopenia   Assessment & Plan:  68 yo AAM  with severe Acute metabolic encephalopathy with severe DT's and admitted with septic shock aspiration pneumonia complicated with AFIB RVR inability to protect airway failed one trial of extubation  Severe ACUTE Hypoxic and Hypercapnic Respiratory Failure -continue Mechanical Ventilator support -Wean Fio2 and PEEP as tolerated -VAP/VENT bundle implementation - Wean PEEP & FiO2 as tolerated, maintain SpO2 > 88% - Head of bed elevated 30 degrees, VAP protocol in place - Plateau pressures less than 30 cm H20  - Intermittent chest x-ray & ABG PRN - Ensure adequate pulmonary hygiene  -will perform SAT/SBT when respiratory parameters are met  SEPTIC shock-resolving SOURCE-aspiration pneumonia -use vasopressors to keep MAP>65 as needed   ACUTE KIDNEY INJURY/Renal Failure-resolving -continue Foley Catheter-assess need -Avoid  nephrotoxic agents -Follow urine output, BMP -Ensure adequate renal perfusion, optimize oxygenation -Renal dose medications   Intake/Output Summary (Last 24 hours) at 05/11/2023 0718 Last data filed at 05/11/2023 0551 Gross per 24 hour  Intake 2770.71 ml  Output 3170 ml  Net -399.29 ml   INFECTIOUS DISEASE -continue antibiotics as prescribed -follow up cultures Aspiration pneumonia ((Trach aspirate with Moraxella Ctarrhalis and proteus Mirabilis, gram stain w/ Gram + Cocci ) ~ TREATED -Monitor fever curve -Trend WBC's & Procalcitonin -Follow cultures as above -Continue empiric Zosyn pending cultures & sensitivities  ACUTE ANEMIA- TRANSFUSE AS NEEDED CONSIDER TRANSFUSION  IF HGB<7   ENDO - ICU hypoglycemic\Hyperglycemia protocol -check FSBS per protocol   GI GI PROPHYLAXIS as indicated  NUTRITIONAL STATUS DIET-->TF's as tolerated Constipation  protocol as indicated   ELECTROLYTES -follow labs as needed -replace as needed -pharmacy consultation and following    Best Practice (right click and "Reselect all SmartList Selections" daily)   Diet/type: tubefeeds DVT prophylaxis: LMWH GI prophylaxis: PPI Lines: Yes and still needed  Foley: Yes and still needed  Code Status:  DNR Last date of multidisciplinary goals of care discussion [05/10/23]  1/24: Will update pts daughter when she arrives at bedside   Labs   CBC: Recent Labs  Lab 05/07/23 0445 05/08/23 0248 05/09/23 0421 05/09/23 1520 05/10/23 0418 05/11/23 0334  WBC 13.3* 21.5* 9.3  --  8.3 6.8  HGB 7.2* 7.8* 7.0* 7.4* 7.7* 7.3*  HCT 22.9* 25.6* 23.1* 22.6* 24.2* 23.9*  MCV 103.6* 108.9* 110.0*  --  108.0* 106.7*  PLT 193 256 207  --  197 210    Basic Metabolic Panel: Recent Labs  Lab 05/06/23 0428 05/06/23 0428 05/07/23 0445 05/08/23 0248 05/08/23 1934 05/09/23 0421 05/10/23 0418 05/11/23 0334  NA 149*  --  147* 151*  --  147* 143 146*  K 4.0  --  4.1 4.0 4.4 3.7 3.8 3.4*  CL 119*  --   116* 118*  --  111 108 108  CO2 22  --  21* 23  --  25 24 27   GLUCOSE 132*  --  146* 99  --  163* 119* 151*  BUN 66*  --  60* 55*  --  47* 36* 33*  CREATININE 1.97*  --  1.84* 1.76*  --  1.78* 1.29* 1.24  CALCIUM 8.3*  --  8.7* 9.2  --  8.8* 8.5* 8.6*  MG 2.1  --   --  2.0 1.9 1.7 2.0  --   PHOS  --    < > 4.3 4.6 5.1* 4.1 3.1 3.1   < > = values in this interval not displayed.   GFR: Estimated Creatinine Clearance: 52.6 mL/min (by C-G formula based on SCr of 1.24 mg/dL). Recent Labs  Lab 05/08/23 0248 05/08/23 1213 05/09/23 0421 05/10/23 0418 05/11/23 0334  PROCALCITON  --  0.35  --   --   --   WBC 21.5*  --  9.3 8.3 6.8  LATICACIDVEN  --  0.9  --   --   --     Liver Function Tests: Recent Labs  Lab 05/07/23 0445 05/08/23 0248 05/09/23 0421 05/10/23 0418 05/11/23 0334  ALBUMIN 2.0* 2.5* 2.1* 2.2* 2.2*   No results for input(s): "LIPASE", "AMYLASE" in the last 168 hours. No results for input(s): "AMMONIA" in the last 168 hours.  ABG    Component Value Date/Time   PHART 7.41 05/08/2023 0947   PCO2ART 39 05/08/2023 0947   PO2ART 94 05/08/2023 0947   HCO3 24.7 05/08/2023 0947   ACIDBASEDEF 0.6 05/08/2023 0430   O2SAT 99.6 05/08/2023 0947     Coagulation Profile: No results for input(s): "INR", "PROTIME" in the last 168 hours.  Cardiac Enzymes: No results for input(s): "CKTOTAL", "CKMB", "CKMBINDEX", "TROPONINI" in the last 168 hours.  HbA1C: Hgb A1c MFr Bld  Date/Time Value Ref Range Status  04/27/2023 04:24 AM 5.1 4.8 - 5.6 % Final    Comment:    (NOTE) Pre diabetes:          5.7%-6.4%  Diabetes:              >6.4%  Glycemic control for   <7.0% adults with diabetes   04/26/2023 03:25 AM 5.1 4.8 - 5.6 % Final  Comment:    (NOTE) Pre diabetes:          5.7%-6.4%  Diabetes:              >6.4%  Glycemic control for   <7.0% adults with diabetes     CBG: Recent Labs  Lab 05/10/23 1120 05/10/23 1543 05/10/23 1950 05/10/23 2346  05/11/23 0323  GLUCAP 149* 132* 125* 105* 135*    Review of Systems:   Unable to assess due to AMS/Intubation/sedation    Past Medical History:  He,  has a past medical history of EtOH dependence (HCC), History of echocardiogram, and Incarcerated inguinal hernia.   Surgical History:   Past Surgical History:  Procedure Laterality Date   INGUINAL HERNIA REPAIR Right 11/01/2016   Procedure: HERNIA REPAIR INGUINAL INCARCERATED;  Surgeon: Leafy Ro, MD;  Location: [facility] ORS;  Service: General;  Laterality: Right;   NO PAST SURGERIES       Social History:   reports that he has been smoking cigarettes. His smokeless tobacco use includes chew. He reports current alcohol use of about 126.0 standard drinks of alcohol per week. He reports that he does not use drugs.   Family History:  His Family history is unknown by patient.   Allergies Allergies  Allergen Reactions   Robinul [Glycopyrrolate] Other (See Comments)    Arrhythmia's     Home Medications  Prior to Admission medications   Not on File      DVT/GI PRX  assessed I Assessed the need for Labs I Assessed the need for Foley I Assessed the need for Central Venous Line Family Discussion when available I Assessed the need for Mobilization I made an Assessment of medications to be adjusted accordingly Safety Risk assessment completed  CASE DISCUSSED IN MULTIDISCIPLINARY ROUNDS WITH ICU TEAM     Critical Care Time devoted to patient care services described in this note is 55 minutes.  Critical care was necessary to treat /prevent imminent and life-threatening deterioration. Overall, patient is critically ill, prognosis is guarded.  Patient with Multiorgan failure and at high risk for cardiac arrest and death.    Lucie Leather, M.D.  [facility]  Medical Director [facility] Medical Director [facility] Cardio-Pulmonary Department

## 2023-05-11 NOTE — Progress Notes (Signed)
PHARMACY CONSULT NOTE  Pharmacy Consult for Electrolyte Monitoring and Replacement   Recent Labs: Potassium (mmol/L)  Date Value  05/11/2023 3.4 (L)  08/23/2013 3.3 (L)   Magnesium (mg/dL)  Date Value  40/98/1191 2.0  08/23/2013 1.4 (L)   Calcium (mg/dL)  Date Value  47/82/9562 8.6 (L)   Calcium, Total (mg/dL)  Date Value  13/11/6576 9.5   Albumin (g/dL)  Date Value  46/96/2952 2.2 (L)   Phosphorus (mg/dL)  Date Value  84/13/2440 3.1  08/23/2013 2.2 (L)   Sodium (mmol/L)  Date Value  05/11/2023 146 (H)  08/23/2013 128 (L)   Assessment: 68 y/o male with h/o SBO secondary to strangulated inguinal hernia s/p repair 2018, etoh abuse and seizures who is admitted with AMS, aspiration pneumonia, sepsis, AKI and rhabdomyolysis. Pharmacy is asked to follow and replace electrolytes while in CCU  Goal of Therapy:  Electrolytes WNL  Plan:  --K 3.4: Kcl IV x 2 --Will re-check electrolytes with AM labs tomorrow  Bettey Costa 05/11/2023 7:53 AM

## 2023-05-12 DIAGNOSIS — N179 Acute kidney failure, unspecified: Secondary | ICD-10-CM | POA: Diagnosis not present

## 2023-05-12 DIAGNOSIS — J9602 Acute respiratory failure with hypercapnia: Secondary | ICD-10-CM | POA: Diagnosis not present

## 2023-05-12 DIAGNOSIS — G9341 Metabolic encephalopathy: Secondary | ICD-10-CM | POA: Diagnosis not present

## 2023-05-12 DIAGNOSIS — J9601 Acute respiratory failure with hypoxia: Secondary | ICD-10-CM | POA: Diagnosis not present

## 2023-05-12 LAB — BASIC METABOLIC PANEL
Anion gap: 8 (ref 5–15)
BUN: 30 mg/dL — ABNORMAL HIGH (ref 8–23)
CO2: 28 mmol/L (ref 22–32)
Calcium: 8.5 mg/dL — ABNORMAL LOW (ref 8.9–10.3)
Chloride: 107 mmol/L (ref 98–111)
Creatinine, Ser: 1.01 mg/dL (ref 0.61–1.24)
GFR, Estimated: >60 mL/min (ref >60)
Glucose, Bld: 145 mg/dL — ABNORMAL HIGH (ref 70–99)
Potassium: 3.3 mmol/L — ABNORMAL LOW (ref 3.5–5.1)
Sodium: 143 mmol/L (ref 135–145)

## 2023-05-12 LAB — MAGNESIUM: Magnesium: 1.6 mg/dL — ABNORMAL LOW (ref 1.7–2.4)

## 2023-05-12 LAB — GLUCOSE, CAPILLARY
Glucose-Capillary: 114 mg/dL — ABNORMAL HIGH (ref 70–99)
Glucose-Capillary: 117 mg/dL — ABNORMAL HIGH (ref 70–99)
Glucose-Capillary: 120 mg/dL — ABNORMAL HIGH (ref 70–99)
Glucose-Capillary: 124 mg/dL — ABNORMAL HIGH (ref 70–99)
Glucose-Capillary: 155 mg/dL — ABNORMAL HIGH (ref 70–99)
Glucose-Capillary: 165 mg/dL — ABNORMAL HIGH (ref 70–99)

## 2023-05-12 LAB — PHOSPHORUS: Phosphorus: 2.3 mg/dL — ABNORMAL LOW (ref 2.5–4.6)

## 2023-05-12 MED ORDER — POTASSIUM PHOSPHATES 15 MMOLE/5ML IV SOLN
30.0000 mmol | Freq: Once | INTRAVENOUS | Status: AC
  Administered 2023-05-12: 30 mmol via INTRAVENOUS
  Filled 2023-05-12: qty 10

## 2023-05-12 MED ORDER — MIDAZOLAM HCL 2 MG/2ML IJ SOLN
2.0000 mg | Freq: Once | INTRAMUSCULAR | Status: AC
  Administered 2023-05-12: 2 mg via INTRAVENOUS

## 2023-05-12 MED ORDER — MAGNESIUM SULFATE 2 GM/50ML IV SOLN
2.0000 g | Freq: Once | INTRAVENOUS | Status: AC
  Administered 2023-05-12: 2 g via INTRAVENOUS
  Filled 2023-05-12: qty 50

## 2023-05-12 MED ORDER — FUROSEMIDE 10 MG/ML IJ SOLN
40.0000 mg | Freq: Once | INTRAMUSCULAR | Status: AC
  Administered 2023-05-12: 40 mg via INTRAVENOUS
  Filled 2023-05-12: qty 4

## 2023-05-12 MED ORDER — SODIUM CHLORIDE 0.9 % IV SOLN
0.5000 mg/h | INTRAVENOUS | Status: DC
  Administered 2023-05-12: 1.75 mg/h via INTRAVENOUS
  Filled 2023-05-12: qty 5

## 2023-05-12 NOTE — Plan of Care (Signed)
  Problem: Education: Goal: Knowledge of General Education information will improve Description: Including pain rating scale, medication(s)/side effects and non-pharmacologic comfort measures Outcome: Progressing   Problem: Health Behavior/Discharge Planning: Goal: Ability to manage health-related needs will improve Outcome: Progressing   Problem: Clinical Measurements: Goal: Ability to maintain clinical measurements within normal limits will improve Outcome: Progressing Goal: Will remain free from infection Outcome: Progressing Goal: Diagnostic test results will improve Outcome: Progressing Goal: Respiratory complications will improve Outcome: Progressing Goal: Cardiovascular complication will be avoided Outcome: Progressing   Problem: Activity: Goal: Risk for activity intolerance will decrease Outcome: Progressing   Problem: Nutrition: Goal: Adequate nutrition will be maintained Outcome: Progressing   Problem: Coping: Goal: Level of anxiety will decrease Outcome: Progressing   Problem: Elimination: Goal: Will not experience complications related to bowel motility Outcome: Progressing Goal: Will not experience complications related to urinary retention Outcome: Progressing   Problem: Pain Management: Goal: General experience of comfort will improve Outcome: Progressing   Problem: Skin Integrity: Goal: Risk for impaired skin integrity will decrease Outcome: Progressing   Problem: Safety: Goal: Ability to remain free from injury will improve Outcome: Progressing   Problem: Education: Goal: Ability to describe self-care measures that may prevent or decrease complications (Diabetes Survival Skills Education) will improve Outcome: Progressing Goal: Individualized Educational Video(s) Outcome: Progressing

## 2023-05-12 NOTE — Progress Notes (Signed)
NAME:  Curtis [name], MRN:  [id number], DOB:  October 14, 1955, LOS: 17 ADMISSION DATE:  04/25/2023, CONSULTATION DATE:  04/25/23 CHIEF COMPLAINT:  Severe Hypothermia   Brief Pt Description / Synopsis:  68 year old male patient with a past medical history of incarcerated inguinal hernia s/p repair, SBO, alcohol withdrawal seizures, EtOH abuse who presented to [facility] on 04/24/2022 for unresponsiveness. He was found to be profoundly hypothermic with temperature measuring 77 F. Subsequently was intubated on 04/24/2022. He was actively rewarmed and started on stress dose steroids. Currently normothermic. Course also complicated by rhabdomyolysis with CK peaking at 6000 and currently downtrending. Finally course complicated by acute hypoxic respiratory failure suspecting aspiration for which she is on Zosyn and doxycycline. Tracheal aspirate w/ Moraxella Cat Patient obstacle to extubation is mental status remains agitated with SAT.  History of Present Illness:  le with significant PMH of incarcerated inguinal hernia s/p repair, SBO, alcohol withdrawal seizure, EtOH abuse who presented to the ED with chief complaints of unresponsiveness.   Per ED report, EMS was dispatched to patient's residence by a neighbor who found the patient unconscious.  On EMS arrival, patient was found unresponsive on floor of bedroom. Neighbor report LKNW was 3 days ago. Patient was initially combative and would tense resist any treatment. EMS report they were unable to obtain a temperature or spo2 due to hypothermia and unable to obtain bp due to combative resistance. patient initial cbg registered at 17 treated with 1mg  glucagon repeat reading were in the upper 20's   ED Course: Initial vital signs showed HR of 55 beats/minute, BP 99/47 mm Hg, the RR 22 breaths/minute, and the oxygen saturation 100% on NRB and a temperature of 27F (25C). Patient intubated in for airway protection. Pertinent Labs/Diagnostics Findings: Na+/ K+:131/4.8   Glucose: 286 BUN/Cr.: 31/1.91 CO2 1, Anion Gap 23, AST/ALT:284/83 WBC: 15.7K/L with neutrophil predominance  Lactic acid: >9.0 COVID PCR: Negative,  troponin: 32  CK 5249 ETOH:177 VBG: pO2 46.2; pCO2 72; pH 6.96;  HCO3 16.2, %O2 Sat 46.6.  CXR> CTH> CTA Chest> CT Abd/pelvis>see results below Medication administered in the JY:NWGNFAO given 30 cc/kg of fluids and started on broad-spectrum antibiotics with Ceftriaxone for suspected sepsis with septic shock.   Pertinent  Medical History   Past Medical History:  Diagnosis Date   EtOH dependence (HCC)    History of echocardiogram    a. 04/2023 Echo: EF >55%, mildly reduced RV fxn, triv MR.   Incarcerated inguinal hernia    a. 10/2016 s/p repair.    Micro Data:  1/9: SARS-CoV-2/RSV/influenza PCR>> negative 1/9: HIV screen>> nonreactive 1/9: Blood culture x 2>> no growth 1/9: MRSA PCR>> negative 1/10: Respiratory viral panel>> negative 1/10: Mycoplasma pneumoniae IgM>> less than 770 1/10: Strep pneumo and Legionella urinary antigens>> negative 1/10: Tracheal aspirate>> Proteus mirabilis, Moraxella Catarrhalis (beta lactamase +) 1/11: Acute viral hepatitis panel>> nonreactive 1/13: Blood culture x 2>> no growth 1/22: RVP>> negative  Antimicrobials:   Anti-infectives (From admission, onward)    Start     Dose/Rate Route Frequency Ordered Stop   05/08/23 1400  piperacillin-tazobactam (ZOSYN) IVPB 3.375 g        3.375 g 12.5 mL/hr over 240 Minutes Intravenous Every 8 hours 05/08/23 1012     05/08/23 1000  Ampicillin-Sulbactam (UNASYN) 3 g in sodium chloride 0.9 % 100 mL IVPB  Status:  Discontinued        3 g 200 mL/hr over 30 Minutes Intravenous Every 6 hours 05/08/23 0803 05/08/23 1011   05/05/23 0900  cefTRIAXone (ROCEPHIN) 2 g in sodium chloride 0.9 % 100 mL IVPB        2 g 200 mL/hr over 30 Minutes Intravenous  Once 05/04/23 1202 05/05/23 1312   05/01/23 1030  cefTRIAXone (ROCEPHIN) 2 g in sodium chloride 0.9 % 100 mL IVPB         2 g 200 mL/hr over 30 Minutes Intravenous Daily 05/01/23 0932 05/04/23 1913   05/01/23 1000  cefTRIAXone (ROCEPHIN) 1 g in sodium chloride 0.9 % 100 mL IVPB  Status:  Discontinued        1 g 200 mL/hr over 30 Minutes Intravenous Every 24 hours 05/01/23 0756 05/01/23 0932   04/27/23 1400  piperacillin-tazobactam (ZOSYN) IVPB 3.375 g  Status:  Discontinued        3.375 g 12.5 mL/hr over 240 Minutes Intravenous Every 8 hours 04/27/23 0744 05/01/23 0756   04/27/23 1000  doxycycline (VIBRAMYCIN) 100 mg in dextrose 5 % 250 mL IVPB        100 mg 125 mL/hr over 120 Minutes Intravenous Every 12 hours 04/26/23 1358 04/29/23 1900   04/26/23 2200  piperacillin-tazobactam (ZOSYN) IVPB 4.5 g  Status:  Discontinued        4.5 g 200 mL/hr over 30 Minutes Intravenous Every 8 hours 04/26/23 1921 04/27/23 0744   04/26/23 1000  azithromycin (ZITHROMAX) 500 mg in sodium chloride 0.9 % 250 mL IVPB  Status:  Discontinued        500 mg 250 mL/hr over 60 Minutes Intravenous Every 24 hours 04/26/23 0401 04/26/23 1357   04/26/23 0600  piperacillin-tazobactam (ZOSYN) IVPB 3.375 g  Status:  Discontinued        3.375 g 12.5 mL/hr over 240 Minutes Intravenous Every 8 hours 04/26/23 0407 04/26/23 1921   04/25/23 1915  cefTRIAXone (ROCEPHIN) 2 g in sodium chloride 0.9 % 100 mL IVPB        2 g 200 mL/hr over 30 Minutes Intravenous  Once 04/25/23 1914 04/25/23 2013       Significant Hospital Events: Including procedures, antibiotic start and stop dates in addition to other pertinent events   04/24/2022 - Intubated, admit to ICU extremely hypotensive.  04/28/2022 - Started on Librium taper and Seroquel BID.  04/29/2022 - Remains with significant agitation on SAT and unable to proceed with extubation.  04/30/2022 Mental status with significant improvement. Following commands. Hgb trending down now at 7.3g/dl. Stools brown. NG tube to suction and no signs of bleeding.  05/01/2022 1pRBC given. Improved mental status.   05/02/2022 Patient H&H stable. Did well on SBT however mentation remains extremley poor with significant thick secretions therefore held off on extubation.  05/03/2022 Remains with poor mental status, not doing well with SBT. My concern is that we are looking at a trach if no improvement in mental status in the next 48hrs.  05/05/2023: Initially tolerated WUA and SBT, however with BM and turning, agitated and severe hypoxia. 05/06/2023: On minimal vent settings, FAILED SBT.  Increase Seroquel to help with agitation.  Decrease free water flushes to 200 q4h. 05/07/2023: On minimal vent settings, perform SBT as tolerated. Diurese with 60 mg IV Lasix x1 dose.  EXTUBATED. 05/08/2023: Pt failed extubation due to worsening acute respiratory failure secondary to excessive secretions requiring reintubation. Pt febrile with worsening leukocytosis, unasyn started for aspiration pneumonia  05/09/2023: Pt remains mechanically intubated FiO2 55%/PEEP 5.  Currently sedated with precedex and low dose fentanyl gtt.  Pt bradycardic will discontinue precedex gtt and restart propofol gtt  05/10/2023: On 50% FiO2.  Diurese with 40 mg IV Lasix x1 dose.  Triglycerides 702, d/c Propofol and change to Dilaudid, Precedex with prn versed pushes.  Developed A.fib with RVR, converted back to NSR with Amiodarone bous. 05/11/2023: Increased WOB on WUA. 05/12/2023: On minimal vent settings, plan for WUA and SBT when family arrives.  Will diurese with 40 mg IV Lasix x1 dose.    Interim History / Subjective:  As outlined above under significant events   Objective   Blood pressure (!) 121/52, pulse (!) 54, temperature 98.8 F (37.1 C), temperature source Axillary, resp. rate 15, height 5' 7.01" (1.702 m), weight 79.7 kg, SpO2 96%.    Vent Mode: PRVC FiO2 (%):  [35 %] 35 % Set Rate:  [15 bmp] 15 bmp Vt Set:  [500 mL] 500 mL PEEP:  [5 cmH20] 5 cmH20 Plateau Pressure:  [18 cmH20] 18 cmH20   Intake/Output Summary (Last 24 hours)  at 05/12/2023 1610 Last data filed at 05/12/2023 0745 Gross per 24 hour  Intake 2977.02 ml  Output 2110 ml  Net 867.02 ml   Filed Weights   05/10/23 0444 05/11/23 0357 05/12/23 0500  Weight: 74.7 kg 76.7 kg 79.7 kg    Examination: General: Acutely ill-appearing male, laying in bed, mechanically intubated and sedated, in NAD HENT: Atraumatic, normocephalic, neck supple, no JVD, orally intubated  Lungs: Coarse throughout, even, non labored, synchronous with vent Cardiovascular: Sinus rhythm s1s2, no m/r/g, 1+ radial/1+ distal pulses  Abdomen: +BS x4, soft, obese, slightly distended  Extremities: Normal bulk and tone, no deformities Neuro: Sedated, not following commands but purposeful movement present, PERRL  GU: Indwelling foley catheter draining yellow urine   Resolved Hospital Problem list   Rhabdomyolysis  Thrombocytopenia   Assessment & Plan:   #Acute metabolic encephalopathy #Sedation needs in setting of mechanical ventilation - Maintain RASS goal of 0 to -1 - PAD protocol to maintain RASS goal: Continue Dilaudid and Precedex  - Avoid sedating medications as able - Daily wake up assessment - Continue thiamine, MVI, folic acid  #Septic shock ~ RESOLVED #Intermittent atrial fibrillation with rvr ~ CONVERTED TO NSR #Elevated Troponin, suspect demand ischemia Echocardiogram 04/26/23: LVEF >55%, diastolic function unable to be evaluated, RV systolic function mildly reduced, RV size is normal -Continuous cardiac monitoring -Maintain MAP >65 -Vasopressors as needed to maintain MAP goal -Diuresis as BP and renal function permits - Prn metoprolol for sustained hr >120 bpm - Troponin peaked at 885  -TSH normal at 1.2 on 04/26/23  #Acute hypoxic respiratory failure  #Aspiration pneumonia (Trach aspirate with Moraxella Ctarrhalis and proteus Mirabilis, gram stain w/ Gram + Cocci) #Mechanical intubation   - Full vent support, implement lung protective strategies - Plateau  pressures less than 30 cm H20 - Wean FiO2 & PEEP as tolerated to maintain O2 sats >92% - Follow intermittent Chest X-ray & ABG as needed - Spontaneous Breathing Trials when respiratory parameters met and mental status permits - Implement VAP Bundle - Scheduled bronchodilator therapy  - IV steroids  - ABX as outlined above - Aggressive pulmonary hygiene - Diuresis as BP and renal function permits ~ will give 40 mg IV Lasix x1 dose on 1/26  #Aspiration pneumonia ((Trach aspirate with Moraxella Ctarrhalis and proteus Mirabilis, gram stain w/ Gram + Cocci ) ~ TREATED -Monitor fever curve -Trend WBC's & Procalcitonin -Follow cultures as above -Continue empiric Zosyn pending cultures & sensitivities  #AKI~IMPROVING #Hypernatremia~RESVOLED  #Hypokalemia - Trend BMP  - Replace electrolytes as indicated  -  Strict I&O's  - Continue free water flush to 200 ml q4hrs  - Replace electrolytes as indicated~pharmacy following for assistance with electrolyte replacement  #Anemia without obvious signs of bleeding  CT Abd/Pelvis 05/02/2023: without any sign of intraperitoneal or retroperitoneal hematoma - Trend CBC - VTE px: subcutaneous lovenox  - Monitor for s/sx of bleeding  - Transfuse for Hgb <8 - GI consulted, appreciate input: No indication for EGD currently, continue PPI BID for SUP, if further drop in Hgb recommends Tagged RBC scan  #Hyperglycemia  #Hypoglycemia~resolved   - CBG's q4hrs  - Sensitive SSI  - Follow hyper/hypoglycemic protocol     Patient is critically ill on the ventilator with multiorgan failure.  Prognosis is guarded, has been difficult to wean from ventilator.  Given current illness superimposed on multiple chronic comorbidities and advanced age, overall long-term prognosis is poor.  Patient is DNR/DNI.  Palliative care is following for ongoing goals of care conversations.  Plan is for optimization for one-way extubation in the next few days with plans to transition  to comfort care should he not tolerate extubation.   Best Practice (right click and "Reselect all SmartList Selections" daily)   Diet/type: tubefeeds DVT prophylaxis: LMWH GI prophylaxis: PPI Lines: Yes and still needed  Foley: Yes and still needed  Code Status:  DNR Last date of multidisciplinary goals of care discussion [05/12/23]  1/26: Will update pts daughter when she arrives at bedside   Labs   CBC: Recent Labs  Lab 05/07/23 0445 05/08/23 0248 05/09/23 0421 05/09/23 1520 05/10/23 0418 05/11/23 0334  WBC 13.3* 21.5* 9.3  --  8.3 6.8  HGB 7.2* 7.8* 7.0* 7.4* 7.7* 7.3*  HCT 22.9* 25.6* 23.1* 22.6* 24.2* 23.9*  MCV 103.6* 108.9* 110.0*  --  108.0* 106.7*  PLT 193 256 207  --  197 210    Basic Metabolic Panel: Recent Labs  Lab 05/08/23 0248 05/08/23 1934 05/09/23 0421 05/10/23 0418 05/11/23 0334 05/12/23 0757  NA 151*  --  147* 143 146* 143  K 4.0 4.4 3.7 3.8 3.4* 3.3*  CL 118*  --  111 108 108 107  CO2 23  --  25 24 27 28   GLUCOSE 99  --  163* 119* 151* 145*  BUN 55*  --  47* 36* 33* 30*  CREATININE 1.76*  --  1.78* 1.29* 1.24 1.01  CALCIUM 9.2  --  8.8* 8.5* 8.6* 8.5*  MG 2.0 1.9 1.7 2.0  --  1.6*  PHOS 4.6 5.1* 4.1 3.1 3.1 2.3*   GFR: Estimated Creatinine Clearance: 69.8 mL/min (by C-G formula based on SCr of 1.01 mg/dL). Recent Labs  Lab 05/08/23 0248 05/08/23 1213 05/09/23 0421 05/10/23 0418 05/11/23 0334  PROCALCITON  --  0.35  --   --   --   WBC 21.5*  --  9.3 8.3 6.8  LATICACIDVEN  --  0.9  --   --   --     Liver Function Tests: Recent Labs  Lab 05/07/23 0445 05/08/23 0248 05/09/23 0421 05/10/23 0418 05/11/23 0334  ALBUMIN 2.0* 2.5* 2.1* 2.2* 2.2*   No results for input(s): "LIPASE", "AMYLASE" in the last 168 hours. No results for input(s): "AMMONIA" in the last 168 hours.  ABG    Component Value Date/Time   PHART 7.41 05/08/2023 0947   PCO2ART 39 05/08/2023 0947   PO2ART 94 05/08/2023 0947   HCO3 24.7 05/08/2023 0947    ACIDBASEDEF 0.6 05/08/2023 0430   O2SAT 99.6 05/08/2023 0947  Coagulation Profile: No results for input(s): "INR", "PROTIME" in the last 168 hours.  Cardiac Enzymes: No results for input(s): "CKTOTAL", "CKMB", "CKMBINDEX", "TROPONINI" in the last 168 hours.  HbA1C: Hgb A1c MFr Bld  Date/Time Value Ref Range Status  04/27/2023 04:24 AM 5.1 4.8 - 5.6 % Final    Comment:    (NOTE) Pre diabetes:          5.7%-6.4%  Diabetes:              >6.4%  Glycemic control for   <7.0% adults with diabetes   04/26/2023 03:25 AM 5.1 4.8 - 5.6 % Final    Comment:    (NOTE) Pre diabetes:          5.7%-6.4%  Diabetes:              >6.4%  Glycemic control for   <7.0% adults with diabetes     CBG: Recent Labs  Lab 05/11/23 1524 05/11/23 1944 05/11/23 2350 05/12/23 0345 05/12/23 0742  GLUCAP 136* 112* 120* 124* 120*    Review of Systems:   Unable to assess due to AMS/Intubation/sedation    Past Medical History:  He,  has a past medical history of EtOH dependence (HCC), History of echocardiogram, and Incarcerated inguinal hernia.   Surgical History:   Past Surgical History:  Procedure Laterality Date   INGUINAL HERNIA REPAIR Right 11/01/2016   Procedure: HERNIA REPAIR INGUINAL INCARCERATED;  Surgeon: Leafy Ro, MD;  Location: [facility] ORS;  Service: General;  Laterality: Right;   NO PAST SURGERIES       Social History:   reports that he has been smoking cigarettes. His smokeless tobacco use includes chew. He reports current alcohol use of about 126.0 standard drinks of alcohol per week. He reports that he does not use drugs.   Family History:  His Family history is unknown by patient.   Allergies Allergies  Allergen Reactions   Robinul [Glycopyrrolate] Other (See Comments)    Arrhythmia's     Home Medications  Prior to Admission medications   Not on File     Critical care time: 40 minutes     Harlon Ditty, AGACNP-BC [facility] Prefer epic messenger for cross cover needs If after hours, please call E-link

## 2023-05-12 NOTE — Progress Notes (Signed)
PHARMACY CONSULT NOTE  Pharmacy Consult for Electrolyte Monitoring and Replacement   Recent Labs: Potassium (mmol/L)  Date Value  05/12/2023 3.3 (L)  08/23/2013 3.3 (L)   Magnesium (mg/dL)  Date Value  25/36/6440 1.6 (L)  08/23/2013 1.4 (L)   Calcium (mg/dL)  Date Value  34/74/2595 8.5 (L)   Calcium, Total (mg/dL)  Date Value  63/87/5643 9.5   Albumin (g/dL)  Date Value  32/95/1884 2.2 (L)   Phosphorus (mg/dL)  Date Value  16/60/6301 2.3 (L)  08/23/2013 2.2 (L)   Sodium (mmol/L)  Date Value  05/12/2023 143  08/23/2013 128 (L)   Assessment: 68 y/o male with h/o SBO secondary to strangulated inguinal hernia s/p repair 2018, etoh abuse and seizures who is admitted with AMS, aspiration pneumonia, sepsis, AKI and rhabdomyolysis. Pharmacy is asked to follow and replace electrolytes while in CCU  Goal of Therapy:  Electrolytes WNL  Plan:  --K 3.3, Phos 2.3: Kphos IV x 1 --Mag 1.6: Mag sulfate 2g IV x 1 --Will re-check electrolytes with AM labs tomorrow  Bettey Costa 05/12/2023 9:08 AM

## 2023-05-13 LAB — CBC
HCT: 23.1 % — ABNORMAL LOW (ref 39.0–52.0)
Hemoglobin: 7.3 g/dL — ABNORMAL LOW (ref 13.0–17.0)
MCH: 32.3 pg (ref 26.0–34.0)
MCHC: 31.6 g/dL (ref 30.0–36.0)
MCV: 102.2 fL — ABNORMAL HIGH (ref 80.0–100.0)
Platelets: 223 10*3/uL (ref 150–400)
RBC: 2.26 MIL/uL — ABNORMAL LOW (ref 4.22–5.81)
RDW: 14.9 % (ref 11.5–15.5)
WBC: 9.3 10*3/uL (ref 4.0–10.5)
nRBC: 0 % (ref 0.0–0.2)

## 2023-05-13 LAB — GLUCOSE, CAPILLARY
Glucose-Capillary: 143 mg/dL — ABNORMAL HIGH (ref 70–99)
Glucose-Capillary: 128 mg/dL — ABNORMAL HIGH (ref 70–99)
Glucose-Capillary: 134 mg/dL — ABNORMAL HIGH (ref 70–99)
Glucose-Capillary: 120 mg/dL — ABNORMAL HIGH (ref 70–99)
Glucose-Capillary: 109 mg/dL — ABNORMAL HIGH (ref 70–99)
Glucose-Capillary: 91 mg/dL (ref 70–99)

## 2023-05-13 LAB — BASIC METABOLIC PANEL
Anion gap: 10 (ref 5–15)
BUN: 30 mg/dL — ABNORMAL HIGH (ref 8–23)
CO2: 29 mmol/L (ref 22–32)
Calcium: 8.5 mg/dL — ABNORMAL LOW (ref 8.9–10.3)
Chloride: 105 mmol/L (ref 98–111)
Creatinine, Ser: 1.02 mg/dL (ref 0.61–1.24)
GFR, Estimated: >60 mL/min (ref >60)
Glucose, Bld: 134 mg/dL — ABNORMAL HIGH (ref 70–99)
Potassium: 3.2 mmol/L — ABNORMAL LOW (ref 3.5–5.1)
Sodium: 144 mmol/L (ref 135–145)

## 2023-05-13 LAB — PHOSPHORUS: Phosphorus: 2.8 mg/dL (ref 2.5–4.6)

## 2023-05-13 LAB — MAGNESIUM
Magnesium: 1.7 mg/dL (ref 1.7–2.4)
Magnesium: 1.8 mg/dL (ref 1.7–2.4)

## 2023-05-13 LAB — VITAMIN B12: Vitamin B-12: 735 pg/mL (ref 180–914)

## 2023-05-13 LAB — POTASSIUM: Potassium: 4.1 mmol/L (ref 3.5–5.1)

## 2023-05-13 MED ORDER — FENTANYL CITRATE (PF) 100 MCG/2ML IJ SOLN: 100.0000 ug | Freq: Once | INTRAMUSCULAR | Status: AC

## 2023-05-13 MED ORDER — FENTANYL CITRATE (PF) 100 MCG/2ML IJ SOLN
INTRAMUSCULAR | Status: AC
  Administered 2023-05-13: 100 ug via INTRAVENOUS
  Filled 2023-05-13: qty 2

## 2023-05-13 MED ORDER — POTASSIUM CHLORIDE 10 MEQ/50ML IV SOLN
10.0000 meq | INTRAVENOUS | Status: AC
  Administered 2023-05-13 (×4): 10 meq via INTRAVENOUS
  Filled 2023-05-13 (×4): qty 50

## 2023-05-13 MED ORDER — MIDAZOLAM HCL 2 MG/2ML IJ SOLN
2.0000 mg | Freq: Once | INTRAMUSCULAR | Status: AC
  Administered 2023-05-13: 2 mg via INTRAVENOUS

## 2023-05-13 MED ORDER — MAGNESIUM SULFATE 2 GM/50ML IV SOLN
2.0000 g | Freq: Once | INTRAVENOUS | Status: AC
  Administered 2023-05-13: 2 g via INTRAVENOUS
  Filled 2023-05-13: qty 50

## 2023-05-13 MED ORDER — KETAMINE HCL-SODIUM CHLORIDE 1000-0.9 MG/100ML-% IV SOLN
1.0000 mg/kg/h | INTRAVENOUS | Status: DC
Start: 2023-05-13 — End: 2023-05-13

## 2023-05-13 NOTE — Progress Notes (Signed)
Restarted Precedex drip after Britton-Lee, NP evaluation of patient. Patient is moving all extremities in uncontrolled pattern and unable to follow commands. Patient is not able to answer any orientation questions and continues to pull at care lines. Patient has care mittens in place. Started at lowest dose and will increase until patient can rest safely in bed and no longer a risk to invasive lines. Will continue to monitor. Bed in the lowest position, call bell within reach side rails up X2, vital signs are currently stable.

## 2023-05-13 NOTE — Progress Notes (Addendum)
Nutrition Follow Up Note   DOCUMENTATION CODES:   Not applicable  INTERVENTION:   RD will add supplements with diet advancement   MVI po daily with diet advancement   Vitamin C 500mg  po BID with diet advancement   NUTRITION DIAGNOSIS:   Inadequate oral intake related to inability to eat (pt sedated and ventilated) as evidenced by NPO status. -ongoing   GOAL:   Patient will meet greater than or equal to 90% of their needs -met with tube feeds   MONITOR:   Diet advancement, Labs, Weight trends, I & O's, Skin  ASSESSMENT:   68 y/o male with h/o SBO secondary to strangulated inguinal hernia s/p repair 2018, etoh abuse and seizures who is admitted with AMS, aspiration pneumonia, sepsis, AKI and  rhabdomyolysis.  Pt extubated today. RD will add supplements with diet advancement. Recommend NGT placement and nutrition support if pt is unable to be initiated on a diet. Per chart, pt appears to be around his UBW.   Medications reviewed and include: lovenox, folic acid, insulin, solu-medrol, MVI, protonix, thiamine, precedex, zosyn  Labs reviewed: K 3.2(L), BUN 30(H), P 2.8 wnl, Mg 1.7 wnl Iron 65, TIBC 259, ferritin 888(H), folate 16.9, B12 1338(H)- 1/17 Hgb 7.3(L), Hct 23.1(L) Cbgs- 134, 128, 143 x 24 hrs   Nutrition Focused Physical Exam:  Flowsheet Row Most Recent Value  Orbital Region Mild depletion  Upper Arm Region No depletion  Thoracic and Lumbar Region No depletion  Buccal Region Mild depletion  Temple Region Moderate depletion  Clavicle Bone Region Moderate depletion  Clavicle and Acromion Bone Region Moderate depletion  Scapular Bone Region No depletion  Dorsal Hand Mild depletion  Patellar Region Moderate depletion  Anterior Thigh Region Moderate depletion  Posterior Calf Region Moderate depletion  Edema (RD Assessment) Mild  Hair Reviewed  Eyes Reviewed  Mouth Reviewed  Skin Reviewed  Nails Reviewed   Diet Order:   Diet Order             Diet NPO  time specified  Diet effective now                  EDUCATION NEEDS:   No education needs have been identified at this time  Skin:  Skin Assessment: Reviewed RN Assessment (Stage II buttocks, laceration L knee)  Last BM:  1/27- via rectal tube  Height:   Ht Readings from Last 1 Encounters:  05/03/23 5' 7.01" (1.702 m)    Weight:   Wt Readings from Last 1 Encounters:  05/13/23 79.7 kg    Ideal Body Weight:  67.2 kg  BMI:  Body mass index is 27.51 kg/m.  Estimated Nutritional Needs:   Kcal:  1900-2200kcal/day  Protein:  95-110g/day  Fluid:  1.8-2.0 L  Betsey Holiday MS, RD, LDN If unable to be reached, please send secure chat to "RD inpatient" available from 8:00a-4:00p daily

## 2023-05-13 NOTE — Progress Notes (Signed)
NAME:  Harry Shuck, MRN:  [id number], DOB:  May 31, 1955, LOS: 18 ADMISSION DATE:  04/25/2023, CONSULTATION DATE:  04/25/23 CHIEF COMPLAINT:  Severe Hypothermia   Brief Pt Description / Synopsis:  68 year old male patient with a past medical history of incarcerated inguinal hernia s/p repair, SBO, alcohol withdrawal seizures, EtOH abuse who presented to [facility] on 04/24/2022 for unresponsiveness. He was found to be profoundly hypothermic with temperature measuring 77 F. Subsequently was intubated on 04/24/2022. He was actively rewarmed and started on stress dose steroids. Currently normothermic. Course also complicated by rhabdomyolysis with CK peaking at 6000 and currently downtrending. Finally course complicated by acute hypoxic respiratory failure suspecting aspiration for which she is on Zosyn and doxycycline. Tracheal aspirate w/ Moraxella Cat Patient obstacle to extubation is mental status remains agitated with SAT.  History of Present Illness:  le with significant PMH of incarcerated inguinal hernia s/p repair, SBO, alcohol withdrawal seizure, EtOH abuse who presented to the ED with chief complaints of unresponsiveness.   Per ED report, EMS was dispatched to patient's residence by a neighbor who found the patient unconscious.  On EMS arrival, patient was found unresponsive on floor of bedroom. Neighbor report LKNW was 3 days ago. Patient was initially combative and would tense resist any treatment. EMS report they were unable to obtain a temperature or spo2 due to hypothermia and unable to obtain bp due to combative resistance. patient initial cbg registered at 5 treated with 1mg  glucagon repeat reading were in the upper 20's   ED Course: Initial vital signs showed HR of 55 beats/minute, BP 99/47 mm Hg, the RR 22 breaths/minute, and the oxygen saturation 100% on NRB and a temperature of 79F (25C). Patient intubated in for airway protection. Pertinent Labs/Diagnostics Findings: Na+/ K+:131/4.8   Glucose: 286 BUN/Cr.: 31/1.91 CO2 1, Anion Gap 23, AST/ALT:284/83 WBC: 15.7K/L with neutrophil predominance  Lactic acid: >9.0 COVID PCR: Negative,  troponin: 32  CK 5249 ETOH:177 VBG: pO2 46.2; pCO2 72; pH 6.96;  HCO3 16.2, %O2 Sat 46.6.  CXR> CTH> CTA Chest> CT Abd/pelvis>see results below Medication administered in the QM:VHQIONG given 30 cc/kg of fluids and started on broad-spectrum antibiotics with Ceftriaxone for suspected sepsis with septic shock.   Pertinent  Medical History   Past Medical History:  Diagnosis Date   EtOH dependence (HCC)    History of echocardiogram    a. 04/2023 Echo: EF >55%, mildly reduced RV fxn, triv MR.   Incarcerated inguinal hernia    a. 10/2016 s/p repair.    Micro Data:  1/9: SARS-CoV-2/RSV/influenza PCR>> negative 1/9: HIV screen>> nonreactive 1/9: Blood culture x 2>> no growth 1/9: MRSA PCR>> negative 1/10: Respiratory viral panel>> negative 1/10: Mycoplasma pneumoniae IgM>> less than 770 1/10: Strep pneumo and Legionella urinary antigens>> negative 1/10: Tracheal aspirate>> Proteus mirabilis, Moraxella Catarrhalis (beta lactamase +) 1/11: Acute viral hepatitis panel>> nonreactive 1/13: Blood culture x 2>> no growth 1/22: RVP>> negative  Antimicrobials:   Anti-infectives (From admission, onward)    Start     Dose/Rate Route Frequency Ordered Stop   05/08/23 1400  piperacillin-tazobactam (ZOSYN) IVPB 3.375 g        3.375 g 12.5 mL/hr over 240 Minutes Intravenous Every 8 hours 05/08/23 1012     05/08/23 1000  Ampicillin-Sulbactam (UNASYN) 3 g in sodium chloride 0.9 % 100 mL IVPB  Status:  Discontinued        3 g 200 mL/hr over 30 Minutes Intravenous Every 6 hours 05/08/23 0803 05/08/23 1011   05/05/23 0900  cefTRIAXone (ROCEPHIN) 2 g in sodium chloride 0.9 % 100 mL IVPB        2 g 200 mL/hr over 30 Minutes Intravenous  Once 05/04/23 1202 05/05/23 1312   05/01/23 1030  cefTRIAXone (ROCEPHIN) 2 g in sodium chloride 0.9 % 100 mL IVPB         2 g 200 mL/hr over 30 Minutes Intravenous Daily 05/01/23 0932 05/04/23 1913   05/01/23 1000  cefTRIAXone (ROCEPHIN) 1 g in sodium chloride 0.9 % 100 mL IVPB  Status:  Discontinued        1 g 200 mL/hr over 30 Minutes Intravenous Every 24 hours 05/01/23 0756 05/01/23 0932   04/27/23 1400  piperacillin-tazobactam (ZOSYN) IVPB 3.375 g  Status:  Discontinued        3.375 g 12.5 mL/hr over 240 Minutes Intravenous Every 8 hours 04/27/23 0744 05/01/23 0756   04/27/23 1000  doxycycline (VIBRAMYCIN) 100 mg in dextrose 5 % 250 mL IVPB        100 mg 125 mL/hr over 120 Minutes Intravenous Every 12 hours 04/26/23 1358 04/29/23 1900   04/26/23 2200  piperacillin-tazobactam (ZOSYN) IVPB 4.5 g  Status:  Discontinued        4.5 g 200 mL/hr over 30 Minutes Intravenous Every 8 hours 04/26/23 1921 04/27/23 0744   04/26/23 1000  azithromycin (ZITHROMAX) 500 mg in sodium chloride 0.9 % 250 mL IVPB  Status:  Discontinued        500 mg 250 mL/hr over 60 Minutes Intravenous Every 24 hours 04/26/23 0401 04/26/23 1357   04/26/23 0600  piperacillin-tazobactam (ZOSYN) IVPB 3.375 g  Status:  Discontinued        3.375 g 12.5 mL/hr over 240 Minutes Intravenous Every 8 hours 04/26/23 0407 04/26/23 1921   04/25/23 1915  cefTRIAXone (ROCEPHIN) 2 g in sodium chloride 0.9 % 100 mL IVPB        2 g 200 mL/hr over 30 Minutes Intravenous  Once 04/25/23 1914 04/25/23 2013       Significant Hospital Events: Including procedures, antibiotic start and stop dates in addition to other pertinent events   04/24/2022 - Intubated, admit to ICU extremely hypotensive.  04/28/2022 - Started on Librium taper and Seroquel BID.  04/29/2022 - Remains with significant agitation on SAT and unable to proceed with extubation.  04/30/2022 Mental status with significant improvement. Following commands. Hgb trending down now at 7.3g/dl. Stools brown. NG tube to suction and no signs of bleeding.  05/01/2022 1pRBC given. Improved mental status.   05/02/2022 Patient H&H stable. Did well on SBT however mentation remains extremley poor with significant thick secretions therefore held off on extubation.  05/03/2022 Remains with poor mental status, not doing well with SBT. My concern is that we are looking at a trach if no improvement in mental status in the next 48hrs.  05/05/2023: Initially tolerated WUA and SBT, however with BM and turning, agitated and severe hypoxia. 05/06/2023: On minimal vent settings, FAILED SBT.  Increase Seroquel to help with agitation.  Decrease free water flushes to 200 q4h. 05/07/2023: On minimal vent settings, perform SBT as tolerated. Diurese with 60 mg IV Lasix x1 dose.  EXTUBATED. 05/08/2023: Pt failed extubation due to worsening acute respiratory failure secondary to excessive secretions requiring reintubation. Pt febrile with worsening leukocytosis, unasyn started for aspiration pneumonia  05/09/2023: Pt remains mechanically intubated FiO2 55%/PEEP 5.  Currently sedated with precedex and low dose fentanyl gtt.  Pt bradycardic will discontinue precedex gtt and restart propofol gtt  05/10/2023: On 50% FiO2.  Diurese with 40 mg IV Lasix x1 dose.  Triglycerides 702, d/c Propofol and change to Dilaudid, Precedex with prn versed pushes.  Developed A.fib with RVR, converted back to NSR with Amiodarone bous. 05/11/2023: Increased WOB on WUA. 05/12/2023: On minimal vent settings, plan for WUA and SBT when family arrives.  Will diurese with 40 mg IV Lasix x1 dose.   05/13/23- for SBT today, opening eyes but not consistently following verbal communication.  Family conference today.  Patient re-evaluated during SBT with passing parameters and liberated from MV.  Extubated 05/13/23 - Met with family reviewed overall comorbid and poor long term prognosis.   Interim History / Subjective:  As outlined above under significant events   Objective   Blood pressure (!) 119/50, pulse (!) 58, temperature 98.5 F (36.9 C), temperature  source Oral, resp. rate 15, height 5' 7.01" (1.702 m), weight 79.7 kg, SpO2 92%.    Vent Mode: PRVC FiO2 (%):  [35 %] 35 % Set Rate:  [15 bmp] 15 bmp Vt Set:  [500 mL] 500 mL PEEP:  [5 cmH20] 5 cmH20 Plateau Pressure:  [17 cmH20] 17 cmH20   Intake/Output Summary (Last 24 hours) at 05/13/2023 1056 Last data filed at 05/13/2023 1610 Gross per 24 hour  Intake 3669.97 ml  Output 2845 ml  Net 824.97 ml   Filed Weights   05/11/23 0357 05/12/23 0500 05/13/23 0430  Weight: 76.7 kg 79.7 kg 79.7 kg    Examination: General: Acutely ill-appearing male, laying in bed, mechanically intubated and sedated, in NAD HENT: Atraumatic, normocephalic, neck supple, no JVD, orally intubated  Lungs: Coarse throughout, even, non labored, synchronous with vent Cardiovascular: Sinus rhythm s1s2, no m/r/g, 1+ radial/1+ distal pulses  Abdomen: +BS x4, soft, obese, slightly distended  Extremities: Normal bulk and tone, no deformities Neuro: Sedated, not following commands but purposeful movement present, PERRL  GU: Indwelling foley catheter draining yellow urine   Resolved Hospital Problem list   Rhabdomyolysis  Thrombocytopenia   Assessment & Plan:   #Acute metabolic encephalopathy - Avoid sedating medications as able - Daily wake up assessment - Continue thiamine, MVI, folic acid due to alcoholism hx  #Septic shock ~ RESOLVED #Intermittent atrial fibrillation with rvr ~ CONVERTED TO NSR #Elevated Troponin, suspect demand ischemia Echocardiogram 04/26/23: LVEF >55%, diastolic function unable to be evaluated, RV systolic function mildly reduced, RV size is normal -Continuous cardiac monitoring -Maintain MAP >65 -Vasopressors as needed to maintain MAP goal -Diuresis as BP and renal function permits - Prn metoprolol for sustained hr >120 bpm - Troponin peaked at 885  -TSH normal at 1.2 on 04/26/23  #Acute hypoxic respiratory failure          Extubated 05/13/23  #Aspiration pneumonia (Trach  aspirate with Moraxella Ctarrhalis and proteus Mirabilis, gram stain w/ Gram + Cocci) #Mechanical intubation   - Full vent support, implement lung protective strategies - Plateau pressures less than 30 cm H20 - Wean FiO2 & PEEP as tolerated to maintain O2 sats >92% - Follow intermittent Chest X-ray & ABG as needed - Spontaneous Breathing Trials when respiratory parameters met and mental status permits - Implement VAP Bundle - Scheduled bronchodilator therapy  - IV steroids  - ABX as outlined above - Aggressive pulmonary hygiene - Diuresis as BP and renal function permits ~ will give 40 mg IV Lasix x1 dose on 1/26  #Aspiration pneumonia ((Trach aspirate with Moraxella Ctarrhalis and proteus Mirabilis, gram stain w/ Gram + Cocci ) ~ TREATED -Monitor  fever curve -Trend WBC's & Procalcitonin -Follow cultures as above -Continue empiric Zosyn pending cultures & sensitivities  #AKI~IMPROVING #Hypernatremia~RESVOLED  #Hypokalemia - Trend BMP  - Replace electrolytes as indicated  - Strict I&O's  - Continue free water flush to 200 ml q4hrs  - Replace electrolytes as indicated~pharmacy following for assistance with electrolyte replacement  #Anemia without obvious signs of bleeding  CT Abd/Pelvis 05/02/2023: without any sign of intraperitoneal or retroperitoneal hematoma - Trend CBC - VTE px: subcutaneous lovenox  - Monitor for s/sx of bleeding  - Transfuse for Hgb <8 - GI consulted, appreciate input: No indication for EGD currently, continue PPI BID for SUP, if further drop in Hgb recommends Tagged RBC scan  #Hyperglycemia  #Hypoglycemia~resolved   - CBG's q4hrs  - Sensitive SSI  - Follow hyper/hypoglycemic protocol     Patient is critically ill on the ventilator with multiorgan failure.  Prognosis is guarded, has been difficult to wean from ventilator.  Given current illness superimposed on multiple chronic comorbidities and advanced age, overall long-term prognosis is poor.   Patient is DNR/DNI.  Palliative care is following for ongoing goals of care conversations.  Plan is for optimization for one-way extubation in the next few days with plans to transition to comfort care should he not tolerate extubation.   Best Practice (right click and "Reselect all SmartList Selections" daily)   Diet/type: tubefeeds DVT prophylaxis: LMWH GI prophylaxis: PPI Lines: Yes and still needed  Foley: Yes and still needed  Code Status:  DNR Last date of multidisciplinary goals of care discussion [05/12/23]  1/26: Will update pts daughter when she arrives at bedside   Labs   CBC: Recent Labs  Lab 05/08/23 0248 05/09/23 0421 05/09/23 1520 05/10/23 0418 05/11/23 0334 05/13/23 0428  WBC 21.5* 9.3  --  8.3 6.8 9.3  HGB 7.8* 7.0* 7.4* 7.7* 7.3* 7.3*  HCT 25.6* 23.1* 22.6* 24.2* 23.9* 23.1*  MCV 108.9* 110.0*  --  108.0* 106.7* 102.2*  PLT 256 207  --  197 210 223    Basic Metabolic Panel: Recent Labs  Lab 05/08/23 1934 05/09/23 0421 05/10/23 0418 05/11/23 0334 05/12/23 0757 05/13/23 0428  NA  --  147* 143 146* 143 144  K 4.4 3.7 3.8 3.4* 3.3* 3.2*  CL  --  111 108 108 107 105  CO2  --  25 24 27 28 29   GLUCOSE  --  163* 119* 151* 145* 134*  BUN  --  47* 36* 33* 30* 30*  CREATININE  --  1.78* 1.29* 1.24 1.01 1.02  CALCIUM  --  8.8* 8.5* 8.6* 8.5* 8.5*  MG 1.9 1.7 2.0  --  1.6* 1.7  PHOS 5.1* 4.1 3.1 3.1 2.3* 2.8   GFR: Estimated Creatinine Clearance: 69.1 mL/min (by C-G formula based on SCr of 1.02 mg/dL). Recent Labs  Lab 05/08/23 1213 05/09/23 0421 05/10/23 0418 05/11/23 0334 05/13/23 0428  PROCALCITON 0.35  --   --   --   --   WBC  --  9.3 8.3 6.8 9.3  LATICACIDVEN 0.9  --   --   --   --     Liver Function Tests: Recent Labs  Lab 05/07/23 0445 05/08/23 0248 05/09/23 0421 05/10/23 0418 05/11/23 0334  ALBUMIN 2.0* 2.5* 2.1* 2.2* 2.2*   No results for input(s): "LIPASE", "AMYLASE" in the last 168 hours. No results for input(s): "AMMONIA" in  the last 168 hours.  ABG    Component Value Date/Time   PHART 7.41 05/08/2023  0947   PCO2ART 39 05/08/2023 0947   PO2ART 94 05/08/2023 0947   HCO3 24.7 05/08/2023 0947   ACIDBASEDEF 0.6 05/08/2023 0430   O2SAT 99.6 05/08/2023 0947     Coagulation Profile: No results for input(s): "INR", "PROTIME" in the last 168 hours.  Cardiac Enzymes: No results for input(s): "CKTOTAL", "CKMB", "CKMBINDEX", "TROPONINI" in the last 168 hours.  HbA1C: Hgb A1c MFr Bld  Date/Time Value Ref Range Status  04/27/2023 04:24 AM 5.1 4.8 - 5.6 % Final    Comment:    (NOTE) Pre diabetes:          5.7%-6.4%  Diabetes:              >6.4%  Glycemic control for   <7.0% adults with diabetes   04/26/2023 03:25 AM 5.1 4.8 - 5.6 % Final    Comment:    (NOTE) Pre diabetes:          5.7%-6.4%  Diabetes:              >6.4%  Glycemic control for   <7.0% adults with diabetes     CBG: Recent Labs  Lab 05/12/23 1548 05/12/23 1957 05/12/23 2342 05/13/23 0338 05/13/23 0730  GLUCAP 165* 117* 114* 143* 128*    Review of Systems:   Unable to assess due to AMS/Intubation/sedation    Past Medical History:  He,  has a past medical history of EtOH dependence (HCC), History of echocardiogram, and Incarcerated inguinal hernia.   Surgical History:   Past Surgical History:  Procedure Laterality Date   INGUINAL HERNIA REPAIR Right 11/01/2016   Procedure: HERNIA REPAIR INGUINAL INCARCERATED;  Surgeon: Leafy Ro, MD;  Location: [facility] ORS;  Service: General;  Laterality: Right;   NO PAST SURGERIES       Social History:   reports that he has been smoking cigarettes. His smokeless tobacco use includes chew. He reports current alcohol use of about 126.0 standard drinks of alcohol per week. He reports that he does not use drugs.   Family History:  His Family history is unknown by patient.   Allergies Allergies  Allergen Reactions   Robinul [Glycopyrrolate] Other (See Comments)    Arrhythmia's      Home Medications  Prior to Admission medications   Not on File     Critical care provider statement:   Total critical care time: 92 minutes   Performed by: Karna Christmas MD   Critical care time was exclusive of separately billable procedures and treating other patients.   Critical care was necessary to treat or prevent imminent or life-threatening deterioration.   Critical care was time spent personally by me on the following activities: development of treatment plan with patient and/or surrogate as well as nursing, discussions with consultants, evaluation of patient's response to treatment, examination of patient, obtaining history from patient or surrogate, ordering and performing treatments and interventions, ordering and review of laboratory studies, ordering and review of radiographic studies, pulse oximetry and re-evaluation of patient's condition.    Vida Rigger, M.D.  Pulmonary & Critical Care Medicine

## 2023-05-13 NOTE — Plan of Care (Signed)
  Problem: Education: Goal: Knowledge of General Education information will improve Description: Including pain rating scale, medication(s)/side effects and non-pharmacologic comfort measures Outcome: Progressing   Problem: Health Behavior/Discharge Planning: Goal: Ability to manage health-related needs will improve Outcome: Progressing   Problem: Clinical Measurements: Goal: Ability to maintain clinical measurements within normal limits will improve Outcome: Progressing Goal: Will remain free from infection Outcome: Progressing Goal: Diagnostic test results will improve Outcome: Progressing Goal: Respiratory complications will improve Outcome: Progressing Goal: Cardiovascular complication will be avoided Outcome: Progressing   Problem: Activity: Goal: Risk for activity intolerance will decrease Outcome: Progressing   Problem: Nutrition: Goal: Adequate nutrition will be maintained Outcome: Progressing   Problem: Coping: Goal: Level of anxiety will decrease Outcome: Progressing   Problem: Elimination: Goal: Will not experience complications related to bowel motility Outcome: Progressing Goal: Will not experience complications related to urinary retention Outcome: Progressing   Problem: Pain Management: Goal: General experience of comfort will improve Outcome: Progressing   Problem: Safety: Goal: Ability to remain free from injury will improve Outcome: Progressing   Problem: Skin Integrity: Goal: Risk for impaired skin integrity will decrease Outcome: Progressing   Problem: Education: Goal: Ability to describe self-care measures that may prevent or decrease complications (Diabetes Survival Skills Education) will improve Outcome: Progressing   Problem: Coping: Goal: Ability to adjust to condition or change in health will improve Outcome: Progressing   Problem: Fluid Volume: Goal: Ability to maintain a balanced intake and output will improve Outcome:  Progressing   Problem: Health Behavior/Discharge Planning: Goal: Ability to identify and utilize available resources and services will improve Outcome: Progressing Goal: Ability to manage health-related needs will improve Outcome: Progressing   Problem: Metabolic: Goal: Ability to maintain appropriate glucose levels will improve Outcome: Progressing   Problem: Nutritional: Goal: Maintenance of adequate nutrition will improve Outcome: Progressing Goal: Progress toward achieving an optimal weight will improve Outcome: Progressing   Problem: Skin Integrity: Goal: Risk for impaired skin integrity will decrease Outcome: Progressing   Problem: Tissue Perfusion: Goal: Adequacy of tissue perfusion will improve Outcome: Progressing

## 2023-05-13 NOTE — Progress Notes (Signed)
PHARMACY CONSULT NOTE  Pharmacy Consult for Electrolyte Monitoring and Replacement   Recent Labs: Potassium (mmol/L)  Date Value  05/13/2023 3.2 (L)  08/23/2013 3.3 (L)   Magnesium (mg/dL)  Date Value  16/01/9603 1.7  08/23/2013 1.4 (L)   Calcium (mg/dL)  Date Value  54/12/8117 8.5 (L)   Calcium, Total (mg/dL)  Date Value  14/78/2956 9.5   Albumin (g/dL)  Date Value  21/30/8657 2.2 (L)   Phosphorus (mg/dL)  Date Value  84/69/6295 2.8  08/23/2013 2.2 (L)   Sodium (mmol/L)  Date Value  05/13/2023 144  08/23/2013 128 (L)   Assessment: 68 y/o male with h/o SBO secondary to strangulated inguinal hernia s/p repair 2018, etoh abuse and seizures who is admitted with AMS, aspiration pneumonia, sepsis, AKI and rhabdomyolysis. Pharmacy is asked to follow and replace electrolytes while in CCU  Goal of Therapy:  Electrolytes WNL  Plan:  --10 mEq IV KCl x 4 per NP --2 grams IV magnesium sulfate x 1 --recheck electrolytes at 1500 and again with AM labs tomorrow  Lowella Bandy 05/13/2023 7:02 AM

## 2023-05-13 NOTE — Plan of Care (Signed)
  Problem: Education: Goal: Knowledge of General Education information will improve Description: Including pain rating scale, medication(s)/side effects and non-pharmacologic comfort measures Outcome: Not Progressing   Problem: Health Behavior/Discharge Planning: Goal: Ability to manage health-related needs will improve Outcome: Not Progressing   Problem: Clinical Measurements: Goal: Ability to maintain clinical measurements within normal limits will improve Outcome: Progressing Goal: Respiratory complications will improve Outcome: Progressing Goal: Cardiovascular complication will be avoided Outcome: Progressing   Problem: Activity: Goal: Risk for activity intolerance will decrease Outcome: Progressing   Problem: Nutrition: Goal: Adequate nutrition will be maintained Outcome: Not Progressing

## 2023-05-14 DIAGNOSIS — G9341 Metabolic encephalopathy: Secondary | ICD-10-CM | POA: Diagnosis not present

## 2023-05-14 DIAGNOSIS — Z7189 Other specified counseling: Secondary | ICD-10-CM | POA: Diagnosis not present

## 2023-05-14 LAB — BASIC METABOLIC PANEL
Anion gap: 7 (ref 5–15)
BUN: 25 mg/dL — ABNORMAL HIGH (ref 8–23)
CO2: 28 mmol/L (ref 22–32)
Calcium: 8.4 mg/dL — ABNORMAL LOW (ref 8.9–10.3)
Chloride: 109 mmol/L (ref 98–111)
Creatinine, Ser: 0.78 mg/dL (ref 0.61–1.24)
GFR, Estimated: >60 mL/min (ref >60)
Glucose, Bld: 120 mg/dL — ABNORMAL HIGH (ref 70–99)
Potassium: 3.5 mmol/L (ref 3.5–5.1)
Sodium: 144 mmol/L (ref 135–145)

## 2023-05-14 LAB — CBC
HCT: 23.1 % — ABNORMAL LOW (ref 39.0–52.0)
Hemoglobin: 7.3 g/dL — ABNORMAL LOW (ref 13.0–17.0)
MCH: 33 pg (ref 26.0–34.0)
MCHC: 31.6 g/dL (ref 30.0–36.0)
MCV: 104.5 fL — ABNORMAL HIGH (ref 80.0–100.0)
Platelets: 170 10*3/uL (ref 150–400)
RBC: 2.21 MIL/uL — ABNORMAL LOW (ref 4.22–5.81)
RDW: 15.1 % (ref 11.5–15.5)
WBC: 10.1 10*3/uL (ref 4.0–10.5)
nRBC: 0 % (ref 0.0–0.2)

## 2023-05-14 LAB — CULTURE, BLOOD (ROUTINE X 2): Special Requests: ADEQUATE

## 2023-05-14 LAB — GLUCOSE, CAPILLARY
Glucose-Capillary: 101 mg/dL — ABNORMAL HIGH (ref 70–99)
Glucose-Capillary: 105 mg/dL — ABNORMAL HIGH (ref 70–99)
Glucose-Capillary: 108 mg/dL — ABNORMAL HIGH (ref 70–99)
Glucose-Capillary: 77 mg/dL (ref 70–99)
Glucose-Capillary: 94 mg/dL (ref 70–99)
Glucose-Capillary: 94 mg/dL (ref 70–99)

## 2023-05-14 LAB — MAGNESIUM: Magnesium: 1.7 mg/dL (ref 1.7–2.4)

## 2023-05-14 LAB — MISC LABCORP TEST (SEND OUT): Labcorp test code: 182261

## 2023-05-14 LAB — PHOSPHORUS: Phosphorus: 2.4 mg/dL — ABNORMAL LOW (ref 2.5–4.6)

## 2023-05-14 MED ORDER — METHYLPREDNISOLONE SODIUM SUCC 40 MG IJ SOLR
20.0000 mg | Freq: Every day | INTRAMUSCULAR | Status: DC
  Administered 2023-05-14 – 2023-05-16 (×3): 20 mg via INTRAVENOUS
  Filled 2023-05-14 (×3): qty 1

## 2023-05-14 MED ORDER — HYDRALAZINE HCL 20 MG/ML IJ SOLN
10.0000 mg | INTRAMUSCULAR | Status: DC | PRN
  Administered 2023-05-16: 20 mg via INTRAVENOUS
  Filled 2023-05-14: qty 1

## 2023-05-14 MED ORDER — FUROSEMIDE 10 MG/ML IJ SOLN
40.0000 mg | Freq: Once | INTRAMUSCULAR | Status: AC
  Administered 2023-05-14: 40 mg via INTRAVENOUS
  Filled 2023-05-14: qty 4

## 2023-05-14 MED ORDER — METHYLPREDNISOLONE SODIUM SUCC 40 MG IJ SOLR: 20.0000 mg | Freq: Every day | INTRAMUSCULAR | Status: DC

## 2023-05-14 MED ORDER — ONDANSETRON HCL 4 MG/2ML IJ SOLN
4.0000 mg | Freq: Once | INTRAMUSCULAR | Status: AC
Start: 2023-05-14 — End: 2023-05-14

## 2023-05-14 MED ORDER — ONDANSETRON HCL 4 MG/2ML IJ SOLN
4.0000 mg | Freq: Four times a day (QID) | INTRAMUSCULAR | Status: DC | PRN
  Administered 2023-05-14: 4 mg via INTRAVENOUS

## 2023-05-14 MED ORDER — GUAIFENESIN 100 MG/5ML PO LIQD
5.0000 mL | ORAL | Status: DC | PRN
  Administered 2023-05-14 – 2023-05-19 (×3): 5 mL via ORAL
  Filled 2023-05-14 (×3): qty 10

## 2023-05-14 MED ORDER — ONDANSETRON HCL 4 MG/2ML IJ SOLN
INTRAMUSCULAR | Status: AC
  Filled 2023-05-14: qty 2

## 2023-05-14 MED ORDER — MAGNESIUM SULFATE 2 GM/50ML IV SOLN
2.0000 g | Freq: Once | INTRAVENOUS | Status: AC
  Administered 2023-05-14: 2 g via INTRAVENOUS
  Filled 2023-05-14: qty 50

## 2023-05-14 NOTE — Plan of Care (Signed)
  Problem: Education: Goal: Knowledge of General Education information will improve Description: Including pain rating scale, medication(s)/side effects and non-pharmacologic comfort measures Outcome: Progressing   Problem: Health Behavior/Discharge Planning: Goal: Ability to manage health-related needs will improve Outcome: Progressing   Problem: Clinical Measurements: Goal: Ability to maintain clinical measurements within normal limits will improve Outcome: Progressing Goal: Will remain free from infection Outcome: Progressing Goal: Diagnostic test results will improve Outcome: Progressing Goal: Respiratory complications will improve Outcome: Progressing Goal: Cardiovascular complication will be avoided Outcome: Progressing   Problem: Activity: Goal: Risk for activity intolerance will decrease Outcome: Progressing   Problem: Nutrition: Goal: Adequate nutrition will be maintained Outcome: Progressing   Problem: Coping: Goal: Level of anxiety will decrease Outcome: Progressing   Problem: Elimination: Goal: Will not experience complications related to bowel motility Outcome: Progressing Goal: Will not experience complications related to urinary retention Outcome: Progressing   Problem: Pain Management: Goal: General experience of comfort will improve Outcome: Progressing   Problem: Safety: Goal: Ability to remain free from injury will improve Outcome: Progressing   Problem: Skin Integrity: Goal: Risk for impaired skin integrity will decrease Outcome: Progressing   Problem: Education: Goal: Ability to describe self-care measures that may prevent or decrease complications (Diabetes Survival Skills Education) will improve Outcome: Progressing   Problem: Coping: Goal: Ability to adjust to condition or change in health will improve Outcome: Progressing   Problem: Fluid Volume: Goal: Ability to maintain a balanced intake and output will improve Outcome:  Progressing   Problem: Health Behavior/Discharge Planning: Goal: Ability to identify and utilize available resources and services will improve Outcome: Progressing Goal: Ability to manage health-related needs will improve Outcome: Progressing   Problem: Metabolic: Goal: Ability to maintain appropriate glucose levels will improve Outcome: Progressing   Problem: Nutritional: Goal: Maintenance of adequate nutrition will improve Outcome: Progressing Goal: Progress toward achieving an optimal weight will improve Outcome: Progressing   Problem: Skin Integrity: Goal: Risk for impaired skin integrity will decrease Outcome: Progressing   Problem: Tissue Perfusion: Goal: Adequacy of tissue perfusion will improve Outcome: Progressing

## 2023-05-14 NOTE — IPAL (Signed)
  Interdisciplinary Goals of Care Family Meeting   Date carried out: 05/14/2023  Location of the meeting: Conference room  Member's involved: Nurse Practitioner and Family Member or next of kin  Durable Power of Attorney or acting medical decision maker: Pts daughter Curtis [name] along with additional family member   Discussion: We discussed goals of care for [employer] .  Discussed 1-way extubation and confirmed with pts daughter Curtis [name] she DOES NOT want pt reintubated if he fails extubation she would like to focus on comfort only   Code status:   Code Status: Limited: Do not attempt resuscitation (DNR) -DNR-LIMITED -Do Not Intubate/DNI    Disposition: Continue current acute care  Time spent for the meeting: 20 minutes    Zada Girt, AGNP  Pulmonary/Critical Care Pager [phone] (please enter 7 digits) PCCM Consult Pager [phone] (please enter 7 digits)

## 2023-05-14 NOTE — Progress Notes (Signed)
NAME:  Curtis [name], MRN:  [id number], DOB:  1955-12-05, LOS: 19 ADMISSION DATE:  04/25/2023, CONSULTATION DATE:  04/25/23 CHIEF COMPLAINT:  Severe Hypothermia   Brief Pt Description / Synopsis:  68 year old male patient with a past medical history of incarcerated inguinal hernia s/p repair, SBO, alcohol withdrawal seizures, EtOH abuse who presented to [facility] on 04/24/2022 for unresponsiveness. He was found to be profoundly hypothermic with temperature measuring 77 F. Subsequently was intubated on 04/24/2022. He was actively rewarmed and started on stress dose steroids. Currently normothermic. Course also complicated by rhabdomyolysis with CK peaking at 6000 and currently downtrending. Finally course complicated by acute hypoxic respiratory failure suspecting aspiration for which she is on Zosyn and doxycycline. Tracheal aspirate w/ Moraxella Cat Patient obstacle to extubation is mental status remains agitated with SAT.  History of Present Illness:  le with significant PMH of incarcerated inguinal hernia s/p repair, SBO, alcohol withdrawal seizure, EtOH abuse who presented to the ED with chief complaints of unresponsiveness.   Per ED report, EMS was dispatched to patient's residence by a neighbor who found the patient unconscious.  On EMS arrival, patient was found unresponsive on floor of bedroom. Neighbor report LKNW was 3 days ago. Patient was initially combative and would tense resist any treatment. EMS report they were unable to obtain a temperature or spo2 due to hypothermia and unable to obtain bp due to combative resistance. patient initial cbg registered at 21 treated with 1mg  glucagon repeat reading were in the upper 20's   ED Course: Initial vital signs showed HR of 55 beats/minute, BP 99/47 mm Hg, the RR 22 breaths/minute, and the oxygen saturation 100% on NRB and a temperature of 85F (25C). Patient intubated in for airway protection. Pertinent Labs/Diagnostics Findings: Na+/ K+:131/4.8   Glucose: 286 BUN/Cr.: 31/1.91 CO2 1, Anion Gap 23, AST/ALT:284/83 WBC: 15.7K/L with neutrophil predominance  Lactic acid: >9.0 COVID PCR: Negative,  troponin: 32  CK 5249 ETOH:177 VBG: pO2 46.2; pCO2 72; pH 6.96;  HCO3 16.2, %O2 Sat 46.6.  CXR> CTH> CTA Chest> CT Abd/pelvis>see results below Medication administered in the DG:[id number] given 30 cc/kg of fluids and started on broad-spectrum antibiotics with Ceftriaxone for suspected sepsis with septic shock.   Pertinent  Medical History   Past Medical History:  Diagnosis Date   EtOH dependence (HCC)    History of echocardiogram    a. 04/2023 Echo: EF >55%, mildly reduced RV fxn, triv MR.   Incarcerated inguinal hernia    a. 10/2016 s/p repair.    Micro Data:  1/9: SARS-CoV-2/RSV/influenza PCR>> negative 1/9: HIV screen>> nonreactive 1/9: Blood culture x 2>> no growth 1/9: MRSA PCR>> negative 1/10: Respiratory viral panel>> negative 1/10: Mycoplasma pneumoniae IgM>> less than 770 1/10: Strep pneumo and Legionella urinary antigens>> negative 1/10: Tracheal aspirate>> Proteus mirabilis, Moraxella Catarrhalis (beta lactamase +) 1/11: Acute viral hepatitis panel>> nonreactive 1/13: Blood culture x 2>> no growth 1/22: RVP>> negative  Antimicrobials:   Anti-infectives (From admission, onward)    Start     Dose/Rate Route Frequency Ordered Stop   05/08/23 1400  piperacillin-tazobactam (ZOSYN) IVPB 3.375 g  Status:  Discontinued        3.375 g 12.5 mL/hr over 240 Minutes Intravenous Every 8 hours 05/08/23 1012 05/14/23 1116   05/08/23 1000  Ampicillin-Sulbactam (UNASYN) 3 g in sodium chloride 0.9 % 100 mL IVPB  Status:  Discontinued        3 g 200 mL/hr over 30 Minutes Intravenous Every 6 hours 05/08/23 0803 05/08/23 1011  05/05/23 0900  cefTRIAXone (ROCEPHIN) 2 g in sodium chloride 0.9 % 100 mL IVPB        2 g 200 mL/hr over 30 Minutes Intravenous  Once 05/04/23 1202 05/05/23 1312   05/01/23 1030  cefTRIAXone (ROCEPHIN) 2 g in  sodium chloride 0.9 % 100 mL IVPB        2 g 200 mL/hr over 30 Minutes Intravenous Daily 05/01/23 0932 05/04/23 1913   05/01/23 1000  cefTRIAXone (ROCEPHIN) 1 g in sodium chloride 0.9 % 100 mL IVPB  Status:  Discontinued        1 g 200 mL/hr over 30 Minutes Intravenous Every 24 hours 05/01/23 0756 05/01/23 0932   04/27/23 1400  piperacillin-tazobactam (ZOSYN) IVPB 3.375 g  Status:  Discontinued        3.375 g 12.5 mL/hr over 240 Minutes Intravenous Every 8 hours 04/27/23 0744 05/01/23 0756   04/27/23 1000  doxycycline (VIBRAMYCIN) 100 mg in dextrose 5 % 250 mL IVPB        100 mg 125 mL/hr over 120 Minutes Intravenous Every 12 hours 04/26/23 1358 04/29/23 1900   04/26/23 2200  piperacillin-tazobactam (ZOSYN) IVPB 4.5 g  Status:  Discontinued        4.5 g 200 mL/hr over 30 Minutes Intravenous Every 8 hours 04/26/23 1921 04/27/23 0744   04/26/23 1000  azithromycin (ZITHROMAX) 500 mg in sodium chloride 0.9 % 250 mL IVPB  Status:  Discontinued        500 mg 250 mL/hr over 60 Minutes Intravenous Every 24 hours 04/26/23 0401 04/26/23 1357   04/26/23 0600  piperacillin-tazobactam (ZOSYN) IVPB 3.375 g  Status:  Discontinued        3.375 g 12.5 mL/hr over 240 Minutes Intravenous Every 8 hours 04/26/23 0407 04/26/23 1921   04/25/23 1915  cefTRIAXone (ROCEPHIN) 2 g in sodium chloride 0.9 % 100 mL IVPB        2 g 200 mL/hr over 30 Minutes Intravenous  Once 04/25/23 1914 04/25/23 2013       Significant Hospital Events: Including procedures, antibiotic start and stop dates in addition to other pertinent events   04/24/2022 - Intubated, admit to ICU extremely hypotensive.  04/28/2022 - Started on Librium taper and Seroquel BID.  04/29/2022 - Remains with significant agitation on SAT and unable to proceed with extubation.  04/30/2022 Mental status with significant improvement. Following commands. Hgb trending down now at 7.3g/dl. Stools brown. NG tube to suction and no signs of bleeding.  05/01/2022  1pRBC given. Improved mental status.  05/02/2022 Patient H&H stable. Did well on SBT however mentation remains extremley poor with significant thick secretions therefore held off on extubation.  05/03/2022 Remains with poor mental status, not doing well with SBT. My concern is that we are looking at a trach if no improvement in mental status in the next 48hrs.  05/05/2023: Initially tolerated WUA and SBT, however with BM and turning, agitated and severe hypoxia. 05/06/2023: On minimal vent settings, FAILED SBT.  Increase Seroquel to help with agitation.  Decrease free water flushes to 200 q4h. 05/07/2023: On minimal vent settings, perform SBT as tolerated. Diurese with 60 mg IV Lasix x1 dose.  EXTUBATED. 05/08/2023: Pt failed extubation due to worsening acute respiratory failure secondary to excessive secretions requiring reintubation. Pt febrile with worsening leukocytosis, unasyn started for aspiration pneumonia  05/09/2023: Pt remains mechanically intubated FiO2 55%/PEEP 5.  Currently sedated with precedex and low dose fentanyl gtt.  Pt bradycardic will discontinue precedex gtt and  restart propofol gtt 05/10/2023: On 50% FiO2.  Diurese with 40 mg IV Lasix x1 dose.  Triglycerides 702, d/c Propofol and change to Dilaudid, Precedex with prn versed pushes.  Developed A.fib with RVR, converted back to NSR with Amiodarone bous. 05/11/2023: Increased WOB on WUA. 05/12/2023: On minimal vent settings, plan for WUA and SBT when family arrives.  Will diurese with 40 mg IV Lasix x1 dose.   05/13/23- for SBT today, opening eyes but not consistently following verbal communication.  Family conference today.  Patient re-evaluated during SBT with passing parameters and liberated from MV.  Extubated 05/13/23 - Met with family reviewed overall comorbid and poor long term prognosis.  05/14/23- patient had slp with diet ordered, behavior is better mentation imrproved  Interim History / Subjective:  As outlined above under  significant events   Objective   Blood pressure (!) 144/57, pulse 64, temperature 98.1 F (36.7 C), temperature source Axillary, resp. rate (!) 22, height 5' 7.01" (1.702 m), weight 78.7 kg, SpO2 98%.    FiO2 (%):  [35 %] 35 %   Intake/Output Summary (Last 24 hours) at 05/14/2023 1643 Last data filed at 05/14/2023 1301 Gross per 24 hour  Intake 446.35 ml  Output 1825 ml  Net -1378.65 ml   Filed Weights   05/12/23 0500 05/13/23 0430 05/14/23 0450  Weight: 79.7 kg 79.7 kg 78.7 kg    Examination: General: NAD HENT: Atraumatic, normocephalic, neck supple, no JVD, orally intubated  Lungs: Coarse throughout, even, non labored, synchronous with vent Cardiovascular: Sinus rhythm s1s2, no m/r/g, 1+ radial/1+ distal pulses  Abdomen: +BS x4, soft, obese, slightly distended  Extremities: Normal bulk and tone, no deformities Neuro: Sedated, not following commands but purposeful movement present, PERRL  GU: Indwelling foley catheter draining yellow urine   Resolved Hospital Problem list   Rhabdomyolysis  Thrombocytopenia   Assessment & Plan:   #Acute metabolic encephalopathy - Avoid sedating medications as able - Daily wake up assessment - Continue thiamine, MVI, folic acid due to alcoholism hx  #Septic shock ~ RESOLVED #Intermittent atrial fibrillation with rvr ~ CONVERTED TO NSR #Elevated Troponin, suspect demand ischemia Echocardiogram 04/26/23: LVEF >55%, diastolic function unable to be evaluated, RV systolic function mildly reduced, RV size is normal -Continuous cardiac monitoring -Maintain MAP >65 -Vasopressors as needed to maintain MAP goal -Diuresis as BP and renal function permits - Prn metoprolol for sustained hr >120 bpm - Troponin peaked at 885  -TSH normal at 1.2 on 04/26/23  #Acute hypoxic respiratory failure          Extubated 05/13/23  #Aspiration pneumonia (Trach aspirate with Moraxella Ctarrhalis and proteus Mirabilis, gram stain w/ Gram + Cocci)  #Aspiration  pneumonia ((Trach aspirate with Moraxella Ctarrhalis and proteus Mirabilis, gram stain w/ Gram + Cocci ) ~ TREATED -Monitor fever curve -Trend WBC's & Procalcitonin -Follow cultures as above -Continue empiric Zosyn pending cultures & sensitivities  #AKI~IMPROVING #Hypernatremia~RESVOLED  #Hypokalemia - Trend BMP  - Replace electrolytes as indicated  - Strict I&O's  - Continue free water flush to 200 ml q4hrs  - Replace electrolytes as indicated~pharmacy following for assistance with electrolyte replacement  #Anemia without obvious signs of bleeding  CT Abd/Pelvis 05/02/2023: without any sign of intraperitoneal or retroperitoneal hematoma - Trend CBC - VTE px: subcutaneous lovenox  - Monitor for s/sx of bleeding  - Transfuse for Hgb <8 - GI consulted, appreciate input: No indication for EGD currently, continue PPI BID for SUP, if further drop in Hgb recommends Tagged RBC  scan  #Hyperglycemia  #Hypoglycemia~resolved   - CBG's q4hrs  - Sensitive SSI  - Follow hyper/hypoglycemic protocol    Best Practice (right click and "Reselect all SmartList Selections" daily)   Diet/type: tubefeeds DVT prophylaxis: LMWH GI prophylaxis: PPI Lines: Yes and still needed  Foley: Yes and still needed  Code Status:  DNR Last date of multidisciplinary goals of care discussion [05/12/23]   Labs   CBC: Recent Labs  Lab 05/09/23 0421 05/09/23 1520 05/10/23 0418 05/11/23 0334 05/13/23 0428 05/14/23 0445  WBC 9.3  --  8.3 6.8 9.3 10.1  HGB 7.0* 7.4* 7.7* 7.3* 7.3* 7.3*  HCT 23.1* 22.6* 24.2* 23.9* 23.1* 23.1*  MCV 110.0*  --  108.0* 106.7* 102.2* 104.5*  PLT 207  --  197 210 223 170    Basic Metabolic Panel: Recent Labs  Lab 05/10/23 0418 05/11/23 0334 05/12/23 0757 05/13/23 0428 05/13/23 1413 05/14/23 0445  NA 143 146* 143 144  --  144  K 3.8 3.4* 3.3* 3.2* 4.1 3.5  CL 108 108 107 105  --  109  CO2 24 27 28 29   --  28  GLUCOSE 119* 151* 145* 134*  --  120*  BUN 36* 33* 30*  30*  --  25*  CREATININE 1.29* 1.24 1.01 1.02  --  0.78  CALCIUM 8.5* 8.6* 8.5* 8.5*  --  8.4*  MG 2.0  --  1.6* 1.7 1.8 1.7  PHOS 3.1 3.1 2.3* 2.8  --  2.4*   GFR: Estimated Creatinine Clearance: 81.5 mL/min (by C-G formula based on SCr of 0.78 mg/dL). Recent Labs  Lab 05/08/23 1213 05/09/23 0421 05/10/23 0418 05/11/23 0334 05/13/23 0428 05/14/23 0445  PROCALCITON 0.35  --   --   --   --   --   WBC  --    < > 8.3 6.8 9.3 10.1  LATICACIDVEN 0.9  --   --   --   --   --    < > = values in this interval not displayed.    Liver Function Tests: Recent Labs  Lab 05/08/23 0248 05/09/23 0421 05/10/23 0418 05/11/23 0334  ALBUMIN 2.5* 2.1* 2.2* 2.2*   No results for input(s): "LIPASE", "AMYLASE" in the last 168 hours. No results for input(s): "AMMONIA" in the last 168 hours.  ABG    Component Value Date/Time   PHART 7.41 05/08/2023 0947   PCO2ART 39 05/08/2023 0947   PO2ART 94 05/08/2023 0947   HCO3 24.7 05/08/2023 0947   ACIDBASEDEF 0.6 05/08/2023 0430   O2SAT 99.6 05/08/2023 0947     Coagulation Profile: No results for input(s): "INR", "PROTIME" in the last 168 hours.  Cardiac Enzymes: No results for input(s): "CKTOTAL", "CKMB", "CKMBINDEX", "TROPONINI" in the last 168 hours.  HbA1C: Hgb A1c MFr Bld  Date/Time Value Ref Range Status  04/27/2023 04:24 AM 5.1 4.8 - 5.6 % Final    Comment:    (NOTE) Pre diabetes:          5.7%-6.4%  Diabetes:              >6.4%  Glycemic control for   <7.0% adults with diabetes   04/26/2023 03:25 AM 5.1 4.8 - 5.6 % Final    Comment:    (NOTE) Pre diabetes:          5.7%-6.4%  Diabetes:              >6.4%  Glycemic control for   <7.0% adults with diabetes  CBG: Recent Labs  Lab 05/13/23 2327 05/14/23 0337 05/14/23 0749 05/14/23 1127 05/14/23 1605  GLUCAP 91 94 108* 94 105*    Review of Systems:   Unable to assess due to AMS/Intubation/sedation    Past Medical History:  He,  has a past medical history  of EtOH dependence (HCC), History of echocardiogram, and Incarcerated inguinal hernia.   Surgical History:   Past Surgical History:  Procedure Laterality Date   INGUINAL HERNIA REPAIR Right 11/01/2016   Procedure: HERNIA REPAIR INGUINAL INCARCERATED;  Surgeon: Leafy Ro, MD;  Location: [facility] ORS;  Service: General;  Laterality: Right;   NO PAST SURGERIES       Social History:   reports that he has been smoking cigarettes. His smokeless tobacco use includes chew. He reports current alcohol use of about 126.0 standard drinks of alcohol per week. He reports that he does not use drugs.   Family History:  His Family history is unknown by patient.   Allergies Allergies  Allergen Reactions   Robinul [Glycopyrrolate] Other (See Comments)    Arrhythmia's     Home Medications  Prior to Admission medications   Not on File     Critical care provider statement:   Total critical care time: 31 minutes   Performed by: Karna Christmas MD   Critical care time was exclusive of separately billable procedures and treating other patients.   Critical care was necessary to treat or prevent imminent or life-threatening deterioration.   Critical care was time spent personally by me on the following activities: development of treatment plan with patient and/or surrogate as well as nursing, discussions with consultants, evaluation of patient's response to treatment, examination of patient, obtaining history from patient or surrogate, ordering and performing treatments and interventions, ordering and review of laboratory studies, ordering and review of radiographic studies, pulse oximetry and re-evaluation of patient's condition.    Vida Rigger, M.D.  Pulmonary & Critical Care Medicine

## 2023-05-14 NOTE — Progress Notes (Signed)
Updated pts daughter Charna Elizabeth via telephone regarding pts condition and current plan of care.  Will continue to monitor and assess pt.  Zada Girt, AGNP  Pulmonary/Critical Care Pager [phone] (please enter 7 digits) PCCM Consult Pager [phone] (please enter 7 digits) a

## 2023-05-14 NOTE — Progress Notes (Signed)
   05/14/23 1030  Spiritual Encounters  Type of Visit Initial  Care provided to: Patient  Referral source Chaplain assessment  Reason for visit Routine spiritual support  OnCall Visit No  Spiritual Framework  Presenting Themes Other (comment) (Introduced Building services engineer)  Interventions  Spiritual Care Interventions Made Established relationship of care and support;Compassionate presence  Intervention Outcomes  Outcomes Other (comment);Awareness of support (Pt stated he has no needs at this time)

## 2023-05-14 NOTE — Evaluation (Signed)
Clinical/Bedside Swallow Evaluation Patient Details  Name: Curtis [name] MRN: [id number] Date of Birth: 09-06-1955  Today's Date: 05/14/2023 Time: SLP Start Time (ACUTE ONLY): 1510 SLP Stop Time (ACUTE ONLY): 1600 SLP Time Calculation (min) (ACUTE ONLY): 50 min  Past Medical History:  Past Medical History:  Diagnosis Date   EtOH dependence (HCC)    History of echocardiogram    a. 04/2023 Echo: EF >55%, mildly reduced RV fxn, triv MR.   Incarcerated inguinal hernia    a. 10/2016 s/p repair.   Past Surgical History:  Past Surgical History:  Procedure Laterality Date   INGUINAL HERNIA REPAIR Right 11/01/2016   Procedure: HERNIA REPAIR INGUINAL INCARCERATED;  Surgeon: Leafy Ro, MD;  Location: [facility] ORS;  Service: General;  Laterality: Right;   NO PAST SURGERIES     HPI:  Pt is a 68 y.o male with significant PMH of incarcerated inguinal hernia s/p repair, SBO, alcohol withdrawal seizure, EtOH abuse who presented to the ED with chief complaints of unresponsiveness.  Pt admitted w/ dxs including: Acute Metabolic Encephalopathy In the setting of severe metabolic acidosis, severe hypothermia and hypoglycemia  #Hx of seizure-likely EtOH withdrawal seizures; Multifocal Pneumonia; Acute Hypoxic Respiratory Failure.  Per ED report, EMS was dispatched to patient's residence by a neighbor who found the patient unconscious.  On EMS arrival, patient was found unresponsive on floor of bedroom. Neighbor report LKNW was 3 days ago.  Pt was intubated, admit to ICU extremely hypotensive; hypothermia.   CXR at admit: negative for acute issues.  CXR on 1/25: Bibasilar collapse/consolidation, right greater than left with  small right and tiny left pleural effusions.   During this admit, pt was orally intubated at admit on 04/25/23; extubated on 05/13/2023 with failed trial of extubation on 05/07/23.    Assessment / Plan / Recommendation  Clinical Impression   Pt seen for BSE this PM. Pt was extubated yesterday post  lengthy oral intubation. Pt awake w/ head tremors Baseline. He verbally responded 2-3x but not consistent to basic questions. He did not follow commands consistently. On [place] O2 support 6L; afebrile. WBC WNL today.   Pt appears to present w/ oropharyngeal phase dysphagia in setting of Acute illness, lengthy oral intubation, and declined Cognitive awareness/engagement. Pt has significant weakness including OM weakness; head tremors. ANY Cognitive decline can impact overall awareness/timing of swallow and safety during po tasks which increases risk for aspiration, choking.  Pt is at increased risk for aspiration/aspiration pneumonia. It potentially can be reduced when using a Dysphagia diet and when following aspiration precautions w/ FULL feeding support. Pt is Edentulous at baseline(~2 teeth) and required verbal/visual/tactile cues for follow through during po tasks.        Pt consumed trials of purees and Nectar liquids via TSP w/ No overt clinical s/s of aspiration noted: no decline in vocal quality, no cough, and no decline in respiratory status during/post trials. O2 sats remained in mod-upper 90s. Laryngeal excursion appeared grossly wfl. Oral phase was grossly adequate for bolus management and oral clearing of the boluses given. Min reduced labial seal and prolonged oral phase was noted intermittently w/ the boluses given. Oral clearing was achieved given Time b/t boluses.  OM Exam was cursory d/t pt's engagement and follow through -- no unilateral weakness noted. Some confusion of OM tasks and oral care noted.          In setting of pt's risk for aspiration in setting of his Acute illness/comorbidities, recommend initiation of the dysphagia level  1(PUREE foods moistened for ease of oral phase) w/ Nectar liquids; aspiration precautions; reduce Distractions during meals. Alternate foods/liquids and give Time for oral clearing b/t boluses. FULL Feeding assistance at meals -- Nectar liquids by TSP. Pills  Crushed in Puree for safer swallowing.  MD/NSG updated.  ST services recommends follow w/ Palliative Care for GOC and education re: his risk for aspiration d/t Dysphagia. ST services will f/u w/ toleration of diet; education as needed during admit. Suspect this diet consistency will be beneficial for pt at time of Discharge to best meet nutrition/hydration needs safely. F/u at next venue of care as indicated. Precautions posted in room.  SLP Visit Diagnosis: Dysphagia, oropharyngeal phase (R13.12) (ETOH abuse, declined Cognitive functioning)    Aspiration Risk  Risk for inadequate nutrition/hydration;Moderate aspiration risk;Severe aspiration risk    Diet Recommendation   Nectar;Dysphagia 1 (puree) = dysphagia level 1(PUREE foods moistened for ease of oral phase) w/ Nectar liquids; aspiration precautions; reduce Distractions during meals. Alternate foods/liquids and give Time for oral clearing b/t boluses. FULL Feeding assistance at meals -- Nectar liquids by TSP.   Medication Administration: Crushed with puree    Other  Recommendations Recommended Consults:  (Palliative Care for GOC; Dietician) Oral Care Recommendations: Oral care BID;Oral care before and after PO;Staff/trained caregiver to provide oral care Caregiver Recommendations: Avoid jello, ice cream, thin soups, popsicles;Remove water pitcher;Have oral suction available    Recommendations for follow up therapy are one component of a multi-disciplinary discharge planning process, led by the attending physician.  Recommendations may be updated based on patient status, additional functional criteria and insurance authorization.  Follow up Recommendations Follow physician's recommendations for discharge plan and follow up therapies (at next venue of care)      Assistance Recommended at Discharge  FULL  Functional Status Assessment Patient has had a recent decline in their functional status and/or demonstrates limited ability to make  significant improvements in function in a reasonable and predictable amount of time  Frequency and Duration min 2x/week  2 weeks       Prognosis Prognosis for improved oropharyngeal function: Guarded Barriers to Reach Goals: Cognitive deficits;Language deficits;Time post onset;Severity of deficits Barriers/Prognosis Comment: ETOH abuse; Cognitive decline currently; lengthy intubation      Swallow Study   General Date of Onset: 04/25/23 HPI: Pt is a 68 y.o male with significant PMH of incarcerated inguinal hernia s/p repair, SBO, alcohol withdrawal seizure, EtOH abuse who presented to the ED with chief complaints of unresponsiveness.  Pt admitted w/ dxs including: Acute Metabolic Encephalopathy In the setting of severe metabolic acidosis, severe hypothermia and hypoglycemia  #Hx of seizure-likely EtOH withdrawal seizures; Multifocal Pneumonia; Acute Hypoxic Respiratory Failure.  Per ED report, EMS was dispatched to patient's residence by a neighbor who found the patient unconscious.  On EMS arrival, patient was found unresponsive on floor of bedroom. Neighbor report LKNW was 3 days ago.  Pt was intubated, admit to ICU extremely hypotensive; hypothermia.   CXR at admit: negative for acute issues.  CXR on 1/25: Bibasilar collapse/consolidation, right greater than left with  small right and tiny left pleural effusions.   During this admit, pt was orally intubated at admit on 04/25/23; extubated on 05/13/2023 with failed trial of extubation on 05/07/23. Type of Study: Bedside Swallow Evaluation Previous Swallow Assessment: none Diet Prior to this Study: NPO (unsure of diet prior to admit) Temperature Spikes Noted: No (wbc 10.1) Respiratory Status: Nasal cannula (6L) History of Recent Intubation: Yes Total duration of intubation (  days): 16 days Date extubated: 05/13/23 Behavior/Cognition: Alert;Cooperative;Pleasant mood;Confused;Distractible;Requires cueing (Head Tremors) Oral Cavity Assessment:  Dry;Dried secretions Oral Care Completed by SLP: Yes Oral Cavity - Dentition: Poor condition;Missing dentition (effectively Edentulous) Vision:  (n/a) Self-Feeding Abilities: Total assist Patient Positioning: Upright in bed (MAX assist) Baseline Vocal Quality: Low vocal intensity (muttered, mumbled speech) Volitional Cough: Cognitively unable to elicit Volitional Swallow: Unable to elicit    Oral/Motor/Sensory Function Overall Oral Motor/Sensory Function: Generalized oral weakness (no unilateral weakness)   Ice Chips Ice chips: Not tested   Thin Liquid Thin Liquid: Not tested    Nectar Thick Nectar Thick Liquid: Impaired (min) Presentation: Spoon (fed; 10 trials) Oral Phase Impairments: Reduced labial seal Oral phase functional implications: Prolonged oral transit (min, intermittent) Pharyngeal Phase Impairments: Suspected delayed Swallow (no cough)   Honey Thick Honey Thick Liquid: Not tested   Puree Puree: Impaired (min) Presentation: Spoon (fed; 10 trials) Oral Phase Impairments: Reduced labial seal Oral Phase Functional Implications: Prolonged oral transit (min, intermittent) Pharyngeal Phase Impairments: Suspected delayed Swallow (no overt coughing)   Solid     Solid: Not tested        Jerilynn Som, MS, CCC-SLP Speech Language Pathologist Rehab Services; [facility] -  [phone] (ascom) Kinte Trim 05/14/2023,4:08 PM

## 2023-05-14 NOTE — Plan of Care (Signed)
  Problem: Clinical Measurements: Goal: Respiratory complications will improve Outcome: Progressing Goal: Cardiovascular complication will be avoided Outcome: Progressing   Problem: Nutrition: Goal: Adequate nutrition will be maintained Outcome: Progressing   Problem: Coping: Goal: Level of anxiety will decrease Outcome: Progressing   Problem: Elimination: Goal: Will not experience complications related to urinary retention Outcome: Progressing   Problem: Safety: Goal: Ability to remain free from injury will improve Outcome: Progressing   Problem: Metabolic: Goal: Ability to maintain appropriate glucose levels will improve Outcome: Progressing

## 2023-05-14 NOTE — Progress Notes (Signed)
Daily Progress Note   Patient Name: Curtis [name]       Date: 05/14/2023 DOB: [birth year]  Age: 68 y.o. MRN#: [id number] Attending Physician: Vida Rigger, MD Primary Care Physician: Patient, No Pcp Per Admit Date: 04/25/2023  Reason for Consultation/Follow-up: Establishing goals of care  Subjective: Notes and labs reviewed.  In to see patient.  Currently he is resting in bed and has been extubated.  No distress noted.  No family at bedside.  Spoke with CCM.  Continuing current care.  PMT will follow.  Length of Stay: 19  Current Medications: Scheduled Meds:   Chlorhexidine Gluconate Cloth  6 each Topical Nightly   enoxaparin (LOVENOX) injection  40 mg Subcutaneous Q24H   folic acid  1 mg Per Tube Daily   insulin aspart  0-9 Units Subcutaneous Q4H   ipratropium-albuterol  3 mL Nebulization TID   methylPREDNISolone (SOLU-MEDROL) injection  20 mg Intravenous Daily   multivitamin with minerals  1 tablet Per Tube Daily   mouth rinse  15 mL Mouth Rinse Q2H   pantoprazole (PROTONIX) IV  40 mg Intravenous Q12H   sodium chloride flush  10-40 mL Intracatheter Q12H   thiamine  100 mg Per Tube Daily    Continuous Infusions:  dexmedetomidine (PRECEDEX) IV infusion 0.6 mcg/kg/hr (05/14/23 1059)   norepinephrine (LEVOPHED) Adult infusion Stopped (05/10/23 2309)    PRN Meds: acetaminophen, acetaminophen, glycopyrrolate, metoprolol tartrate, mouth rinse, polyethylene glycol, sodium chloride flush  Physical Exam          Vital Signs: BP (!) 147/61   Pulse (!) 50   Temp 97.9 F (36.6 C) (Axillary)   Resp 14   Ht 5' 7.01" (1.702 m)   Wt 78.7 kg   SpO2 100%   BMI 27.17 kg/m  SpO2: SpO2: 100 % O2 Device: O2 Device: High Flow Nasal Cannula O2 Flow Rate: O2 Flow Rate (L/min): 10  L/min  Intake/output summary:  Intake/Output Summary (Last 24 hours) at 05/14/2023 1142 Last data filed at 05/14/2023 1134 Gross per 24 hour  Intake 599.29 ml  Output 1825 ml  Net -1225.71 ml   LBM: Last BM Date : 05/14/23 Baseline Weight: Weight: 79 kg Most recent weight: Weight: 78.7 kg         Patient Active Problem List   Diagnosis Date Noted   Demand  ischemia (HCC) 04/27/2023   Shock circulatory (HCC) 04/26/2023   Sepsis with encephalopathy (HCC) 04/26/2023   Hypothermia 04/26/2023   Non-traumatic rhabdomyolysis 04/26/2023   AKI (acute kidney injury) (HCC) 04/26/2023   NSTEMI (non-ST elevated myocardial infarction) (HCC) 04/26/2023   Urinary tract infection, acute 04/26/2023   Acute metabolic encephalopathy 04/25/2023   Incarcerated hernia 11/02/2016   Malnutrition of moderate degree 11/02/2016   Incarcerated inguinal hernia    SBO (small bowel obstruction) (HCC)    Incarcerated right inguinal hernia    Seizures (HCC) 06/26/2016   Non-recurrent unilateral inguinal hernia without obstruction or gangrene    Alcohol withdrawal (HCC) 06/21/2016    Palliative Care Assessment & Plan     Recommendations/Plan: Patient has been extubated and is handling this well.  Continue current care.  PMT will follow for needs.  Code Status:    Code Status Orders  (From admission, onward)           Start     Ordered   05/09/23 1338  Do not attempt resuscitation (DNR) Pre-Arrest Interventions Desired  (Code Status)  Continuous       Question Answer Comment  If pulseless and not breathing No CPR or chest compressions.   In Pre-Arrest Conditions (Patient Has Pulse and Is Breathing) May intubate, use advanced airway interventions and cardioversion/ACLS medications if appropriate or indicated. May transfer to ICU.   Consent: Discussion documented in EHR or advanced directives reviewed      05/09/23 1337           Code Status History     Date Active Date Inactive Code  Status Order ID Comments User Context   04/25/2023 2011 05/09/2023 1337 Full Code [id number]  Jimmye Norman, NP ED   05/15/2017 0931 05/16/2017 0413 Full Code [id number]  Myrna Blazer, MD ED   11/02/2016 0132 11/03/2016 1823 Full Code [id number]  Leafy Ro, MD Inpatient   11/01/2016 2316 11/02/2016 0132 Full Code [id number]  Myrna Blazer, MD ED   06/26/2016 2324 06/28/2016 1556 Full Code [id number]  Oralia Manis, MD Inpatient   06/21/2016 1511 06/25/2016 1929 Full Code [id number]  Enedina Finner, MD Inpatient    Care plan was discussed with CCM NP  Thank you for allowing the Palliative Medicine Team to assist in the care of this patient.     Morton Stall, NP  Please contact Palliative Medicine Team phone at [phone] for questions and concerns.

## 2023-05-14 NOTE — Progress Notes (Signed)
Zada Girt NP, made aware patient continues to have increased secretion. He is able to cough up a little but still very congested. Orders placed for prn robitussin. 02 AT 6L Noel, sat 95%, resp 25-30. Do no increase 02 at this time per [facility]. Freda RT, made aware of increase wob and secretions, okay to do chest PT as well to help loosen secretions.

## 2023-05-14 NOTE — Progress Notes (Signed)
PHARMACY CONSULT NOTE  Pharmacy Consult for Electrolyte Monitoring and Replacement   Recent Labs: Potassium (mmol/L)  Date Value  05/14/2023 3.5  08/23/2013 3.3 (L)   Magnesium (mg/dL)  Date Value  16/01/9603 1.7  08/23/2013 1.4 (L)   Calcium (mg/dL)  Date Value  54/12/8117 8.4 (L)   Calcium, Total (mg/dL)  Date Value  14/78/2956 9.5   Albumin (g/dL)  Date Value  21/30/8657 2.2 (L)   Phosphorus (mg/dL)  Date Value  84/69/6295 2.4 (L)  08/23/2013 2.2 (L)   Sodium (mmol/L)  Date Value  05/14/2023 144  08/23/2013 128 (L)   Assessment: 68 y/o male with h/o SBO secondary to strangulated inguinal hernia s/p repair 2018, etoh abuse and seizures who is admitted with AMS, aspiration pneumonia, sepsis, AKI and rhabdomyolysis. Pharmacy is asked to follow and replace electrolytes while in CCU  Goal of Therapy:  Electrolytes WNL  Plan:  --2 grams IV magnesium sulfate x 1 --recheck electrolytes at 1500 and again with AM labs tomorrow  Lowella Bandy 05/14/2023 7:11 AM

## 2023-05-14 NOTE — Progress Notes (Signed)
Patient sat dropping 88%, increased WOB. Patient vomiting, zofran ordered. Patient placed back on HFNC @15L 

## 2023-05-15 DIAGNOSIS — Z7189 Other specified counseling: Secondary | ICD-10-CM | POA: Diagnosis not present

## 2023-05-15 DIAGNOSIS — G9341 Metabolic encephalopathy: Secondary | ICD-10-CM | POA: Diagnosis not present

## 2023-05-15 LAB — MAGNESIUM
Magnesium: 1.6 mg/dL — ABNORMAL LOW (ref 1.7–2.4)
Magnesium: 2.2 mg/dL (ref 1.7–2.4)

## 2023-05-15 LAB — CBC
HCT: 26.9 % — ABNORMAL LOW (ref 39.0–52.0)
Hemoglobin: 8.3 g/dL — ABNORMAL LOW (ref 13.0–17.0)
MCH: 32.3 pg (ref 26.0–34.0)
MCHC: 30.9 g/dL (ref 30.0–36.0)
MCV: 104.7 fL — ABNORMAL HIGH (ref 80.0–100.0)
Platelets: 311 10*3/uL (ref 150–400)
RBC: 2.57 MIL/uL — ABNORMAL LOW (ref 4.22–5.81)
RDW: 15 % (ref 11.5–15.5)
WBC: 12.2 10*3/uL — ABNORMAL HIGH (ref 4.0–10.5)
nRBC: 0 % (ref 0.0–0.2)

## 2023-05-15 LAB — BASIC METABOLIC PANEL
Anion gap: 18 — ABNORMAL HIGH (ref 5–15)
BUN: 19 mg/dL (ref 8–23)
CO2: 26 mmol/L (ref 22–32)
Calcium: 8.7 mg/dL — ABNORMAL LOW (ref 8.9–10.3)
Chloride: 103 mmol/L (ref 98–111)
Creatinine, Ser: 0.89 mg/dL (ref 0.61–1.24)
GFR, Estimated: >60 mL/min (ref >60)
Glucose, Bld: 89 mg/dL (ref 70–99)
Potassium: 3.1 mmol/L — ABNORMAL LOW (ref 3.5–5.1)
Sodium: 147 mmol/L — ABNORMAL HIGH (ref 135–145)

## 2023-05-15 LAB — GLUCOSE, CAPILLARY
Glucose-Capillary: 71 mg/dL (ref 70–99)
Glucose-Capillary: 95 mg/dL (ref 70–99)
Glucose-Capillary: 104 mg/dL — ABNORMAL HIGH (ref 70–99)
Glucose-Capillary: 124 mg/dL — ABNORMAL HIGH (ref 70–99)
Glucose-Capillary: 76 mg/dL (ref 70–99)
Glucose-Capillary: 111 mg/dL — ABNORMAL HIGH (ref 70–99)

## 2023-05-15 LAB — POTASSIUM: Potassium: 3.7 mmol/L (ref 3.5–5.1)

## 2023-05-15 LAB — PHOSPHORUS: Phosphorus: 2.8 mg/dL (ref 2.5–4.6)

## 2023-05-15 MED ORDER — METOPROLOL TARTRATE 25 MG PO TABS
12.5000 mg | ORAL_TABLET | Freq: Two times a day (BID) | ORAL | Status: DC
  Administered 2023-05-15 – 2023-05-16 (×3): 12.5 mg via ORAL
  Filled 2023-05-15 (×3): qty 1

## 2023-05-15 MED ORDER — CHLORDIAZEPOXIDE HCL 25 MG PO CAPS
25.0000 mg | ORAL_CAPSULE | Freq: Every day | ORAL | Status: AC
Start: 2023-05-18 — End: 2023-05-18
  Administered 2023-05-18: 25 mg via ORAL
  Filled 2023-05-15: qty 1

## 2023-05-15 MED ORDER — METOPROLOL TARTRATE 25 MG PO TABS: 12.5000 mg | ORAL_TABLET | Freq: Two times a day (BID) | ORAL | Status: DC

## 2023-05-15 MED ORDER — FOLIC ACID 1 MG PO TABS
1.0000 mg | ORAL_TABLET | Freq: Every day | ORAL | Status: DC
  Administered 2023-05-16 – 2023-05-21 (×6): 1 mg via ORAL
  Filled 2023-05-15 (×6): qty 1

## 2023-05-15 MED ORDER — POTASSIUM CHLORIDE CRYS ER 20 MEQ PO TBCR
40.0000 meq | EXTENDED_RELEASE_TABLET | ORAL | Status: DC
  Filled 2023-05-15: qty 2

## 2023-05-15 MED ORDER — QUETIAPINE FUMARATE 25 MG PO TABS
50.0000 mg | ORAL_TABLET | Freq: Every day | ORAL | Status: DC
Start: 2023-05-15 — End: 2023-05-18
  Administered 2023-05-15 – 2023-05-17 (×3): 50 mg via ORAL
  Filled 2023-05-15 (×3): qty 2

## 2023-05-15 MED ORDER — METOPROLOL TARTRATE 5 MG/5ML IV SOLN
5.0000 mg | Freq: Once | INTRAVENOUS | Status: AC
  Administered 2023-05-15: 5 mg via INTRAVENOUS
  Filled 2023-05-15: qty 5

## 2023-05-15 MED ORDER — CHLORDIAZEPOXIDE HCL 25 MG PO CAPS
25.0000 mg | ORAL_CAPSULE | Freq: Four times a day (QID) | ORAL | Status: AC
  Administered 2023-05-15 – 2023-05-16 (×4): 25 mg via ORAL
  Filled 2023-05-15 (×4): qty 1

## 2023-05-15 MED ORDER — DIGOXIN 0.25 MG/ML IJ SOLN
0.2500 mg | Freq: Once | INTRAMUSCULAR | Status: AC
  Administered 2023-05-15: 0.25 mg via INTRAVENOUS
  Filled 2023-05-15: qty 2

## 2023-05-15 MED ORDER — LORAZEPAM 2 MG/ML IJ SOLN
1.0000 mg | Freq: Once | INTRAMUSCULAR | Status: AC
Start: 2023-05-15 — End: 2023-05-15
  Administered 2023-05-15: 1 mg via INTRAVENOUS
  Filled 2023-05-15: qty 1

## 2023-05-15 MED ORDER — ADULT MULTIVITAMIN W/MINERALS CH
1.0000 | ORAL_TABLET | Freq: Every day | ORAL | Status: DC
  Administered 2023-05-16 – 2023-05-21 (×6): 1 via ORAL
  Filled 2023-05-15 (×6): qty 1

## 2023-05-15 MED ORDER — MAGNESIUM SULFATE 4 GM/100ML IV SOLN
4.0000 g | Freq: Once | INTRAVENOUS | Status: AC
  Administered 2023-05-15: 4 g via INTRAVENOUS
  Filled 2023-05-15: qty 100

## 2023-05-15 MED ORDER — CHLORDIAZEPOXIDE HCL 25 MG PO CAPS
25.0000 mg | ORAL_CAPSULE | ORAL | Status: AC
  Administered 2023-05-17 – 2023-05-18 (×2): 25 mg via ORAL
  Filled 2023-05-15 (×2): qty 1

## 2023-05-15 MED ORDER — AMIODARONE IV BOLUS ONLY 150 MG/100ML
150.0000 mg | Freq: Once | INTRAVENOUS | Status: AC
  Administered 2023-05-15: 150 mg via INTRAVENOUS
  Filled 2023-05-15: qty 100

## 2023-05-15 MED ORDER — POTASSIUM CHLORIDE 20 MEQ PO PACK
40.0000 meq | PACK | ORAL | Status: AC
  Administered 2023-05-15 (×2): 40 meq via ORAL
  Filled 2023-05-15 (×2): qty 2

## 2023-05-15 MED ORDER — NEPRO/CARBSTEADY PO LIQD
237.0000 mL | Freq: Three times a day (TID) | ORAL | Status: DC
  Administered 2023-05-15 – 2023-05-20 (×12): 237 mL via ORAL

## 2023-05-15 MED ORDER — VITAMIN C 500 MG PO TABS
500.0000 mg | ORAL_TABLET | Freq: Two times a day (BID) | ORAL | Status: DC
  Administered 2023-05-15 – 2023-05-21 (×12): 500 mg via ORAL
  Filled 2023-05-15 (×12): qty 1

## 2023-05-15 MED ORDER — THIAMINE MONONITRATE 100 MG PO TABS
100.0000 mg | ORAL_TABLET | Freq: Every day | ORAL | Status: DC
Start: 2023-05-16 — End: 2023-05-22
  Administered 2023-05-16 – 2023-05-21 (×6): 100 mg via ORAL
  Filled 2023-05-15 (×6): qty 1

## 2023-05-15 MED ORDER — CHLORDIAZEPOXIDE HCL 25 MG PO CAPS
25.0000 mg | ORAL_CAPSULE | Freq: Three times a day (TID) | ORAL | Status: AC
  Administered 2023-05-16 – 2023-05-17 (×3): 25 mg via ORAL
  Filled 2023-05-15 (×3): qty 1

## 2023-05-15 NOTE — Progress Notes (Signed)
NAME:  Curtis [name], MRN:  [id number], DOB:  1956/01/13, LOS: 20 ADMISSION DATE:  04/25/2023, CONSULTATION DATE:  04/25/23 CHIEF COMPLAINT:  Severe Hypothermia   Brief Pt Description / Synopsis:  68 year old male patient with a past medical history of incarcerated inguinal hernia s/p repair, SBO, alcohol withdrawal seizures, EtOH abuse who presented to [facility] on 04/24/2022 for unresponsiveness. He was found to be profoundly hypothermic with temperature measuring 77 F. Subsequently was intubated on 04/24/2022. He was actively rewarmed and started on stress dose steroids. Currently normothermic. Course also complicated by rhabdomyolysis with CK peaking at 6000 and currently downtrending. Finally course complicated by acute hypoxic respiratory failure suspecting aspiration for which she is on Zosyn and doxycycline. Tracheal aspirate w/ Moraxella Cat Patient obstacle to extubation is mental status remains agitated with SAT.  History of Present Illness:  le with significant PMH of incarcerated inguinal hernia s/p repair, SBO, alcohol withdrawal seizure, EtOH abuse who presented to the ED with chief complaints of unresponsiveness.   Per ED report, EMS was dispatched to patient's residence by a neighbor who found the patient unconscious.  On EMS arrival, patient was found unresponsive on floor of bedroom. Neighbor report LKNW was 3 days ago. Patient was initially combative and would tense resist any treatment. EMS report they were unable to obtain a temperature or spo2 due to hypothermia and unable to obtain bp due to combative resistance. patient initial cbg registered at 29 treated with 1mg  glucagon repeat reading were in the upper 20's   ED Course: Initial vital signs showed HR of 55 beats/minute, BP 99/47 mm Hg, the RR 22 breaths/minute, and the oxygen saturation 100% on NRB and a temperature of 65F (25C). Patient intubated in for airway protection. Pertinent Labs/Diagnostics Findings: Na+/ K+:131/4.8   Glucose: 286 BUN/Cr.: 31/1.91 CO2 1, Anion Gap 23, AST/ALT:284/83 WBC: 15.7K/L with neutrophil predominance  Lactic acid: >9.0 COVID PCR: Negative,  troponin: 32  CK 5249 ETOH:177 VBG: pO2 46.2; pCO2 72; pH 6.96;  HCO3 16.2, %O2 Sat 46.6.  CXR> CTH> CTA Chest> CT Abd/pelvis>see results below Medication administered in the ZH:YQMVHQI given 30 cc/kg of fluids and started on broad-spectrum antibiotics with Ceftriaxone for suspected sepsis with septic shock.   05/15/23- patient on diet without aspiration events.  Nonfebrile. Had episode of Afrvr overnight, now rate controlled on amio.  He does have few electrolyte abnormalities with repletion ongoing. He seems to have development of alcohol withdrawal.  Rectal tube active.  Weaned to 6L/min [place].  Starting beta blockade for af.     Pertinent  Medical History   Past Medical History:  Diagnosis Date   EtOH dependence (HCC)    History of echocardiogram    a. 04/2023 Echo: EF >55%, mildly reduced RV fxn, triv MR.   Incarcerated inguinal hernia    a. 10/2016 s/p repair.    Micro Data:  1/9: SARS-CoV-2/RSV/influenza PCR>> negative 1/9: HIV screen>> nonreactive 1/9: Blood culture x 2>> no growth 1/9: MRSA PCR>> negative 1/10: Respiratory viral panel>> negative 1/10: Mycoplasma pneumoniae IgM>> less than 770 1/10: Strep pneumo and Legionella urinary antigens>> negative 1/10: Tracheal aspirate>> Proteus mirabilis, Moraxella Catarrhalis (beta lactamase +) 1/11: Acute viral hepatitis panel>> nonreactive 1/13: Blood culture x 2>> no growth 1/22: RVP>> negative  Antimicrobials:   Anti-infectives (From admission, onward)    Start     Dose/Rate Route Frequency Ordered Stop   05/08/23 1400  piperacillin-tazobactam (ZOSYN) IVPB 3.375 g  Status:  Discontinued        3.375 g  12.5 mL/hr over 240 Minutes Intravenous Every 8 hours 05/08/23 1012 05/14/23 1116   05/08/23 1000  Ampicillin-Sulbactam (UNASYN) 3 g in sodium chloride 0.9 % 100 mL IVPB   Status:  Discontinued        3 g 200 mL/hr over 30 Minutes Intravenous Every 6 hours 05/08/23 0803 05/08/23 1011   05/05/23 0900  cefTRIAXone (ROCEPHIN) 2 g in sodium chloride 0.9 % 100 mL IVPB        2 g 200 mL/hr over 30 Minutes Intravenous  Once 05/04/23 1202 05/05/23 1312   05/01/23 1030  cefTRIAXone (ROCEPHIN) 2 g in sodium chloride 0.9 % 100 mL IVPB        2 g 200 mL/hr over 30 Minutes Intravenous Daily 05/01/23 0932 05/04/23 1913   05/01/23 1000  cefTRIAXone (ROCEPHIN) 1 g in sodium chloride 0.9 % 100 mL IVPB  Status:  Discontinued        1 g 200 mL/hr over 30 Minutes Intravenous Every 24 hours 05/01/23 0756 05/01/23 0932   04/27/23 1400  piperacillin-tazobactam (ZOSYN) IVPB 3.375 g  Status:  Discontinued        3.375 g 12.5 mL/hr over 240 Minutes Intravenous Every 8 hours 04/27/23 0744 05/01/23 0756   04/27/23 1000  doxycycline (VIBRAMYCIN) 100 mg in dextrose 5 % 250 mL IVPB        100 mg 125 mL/hr over 120 Minutes Intravenous Every 12 hours 04/26/23 1358 04/29/23 1900   04/26/23 2200  piperacillin-tazobactam (ZOSYN) IVPB 4.5 g  Status:  Discontinued        4.5 g 200 mL/hr over 30 Minutes Intravenous Every 8 hours 04/26/23 1921 04/27/23 0744   04/26/23 1000  azithromycin (ZITHROMAX) 500 mg in sodium chloride 0.9 % 250 mL IVPB  Status:  Discontinued        500 mg 250 mL/hr over 60 Minutes Intravenous Every 24 hours 04/26/23 0401 04/26/23 1357   04/26/23 0600  piperacillin-tazobactam (ZOSYN) IVPB 3.375 g  Status:  Discontinued        3.375 g 12.5 mL/hr over 240 Minutes Intravenous Every 8 hours 04/26/23 0407 04/26/23 1921   04/25/23 1915  cefTRIAXone (ROCEPHIN) 2 g in sodium chloride 0.9 % 100 mL IVPB        2 g 200 mL/hr over 30 Minutes Intravenous  Once 04/25/23 1914 04/25/23 2013       Significant Hospital Events: Including procedures, antibiotic start and stop dates in addition to other pertinent events   04/24/2022 - Intubated, admit to ICU extremely hypotensive.   04/28/2022 - Started on Librium taper and Seroquel BID.  04/29/2022 - Remains with significant agitation on SAT and unable to proceed with extubation.  04/30/2022 Mental status with significant improvement. Following commands. Hgb trending down now at 7.3g/dl. Stools brown. NG tube to suction and no signs of bleeding.  05/01/2022 1pRBC given. Improved mental status.  05/02/2022 Patient H&H stable. Did well on SBT however mentation remains extremley poor with significant thick secretions therefore held off on extubation.  05/03/2022 Remains with poor mental status, not doing well with SBT. My concern is that we are looking at a trach if no improvement in mental status in the next 48hrs.  05/05/2023: Initially tolerated WUA and SBT, however with BM and turning, agitated and severe hypoxia. 05/06/2023: On minimal vent settings, FAILED SBT.  Increase Seroquel to help with agitation.  Decrease free water flushes to 200 q4h. 05/07/2023: On minimal vent settings, perform SBT as tolerated. Diurese with 60 mg IV  Lasix x1 dose.  EXTUBATED. 05/08/2023: Pt failed extubation due to worsening acute respiratory failure secondary to excessive secretions requiring reintubation. Pt febrile with worsening leukocytosis, unasyn started for aspiration pneumonia  05/09/2023: Pt remains mechanically intubated FiO2 55%/PEEP 5.  Currently sedated with precedex and low dose fentanyl gtt.  Pt bradycardic will discontinue precedex gtt and restart propofol gtt 05/10/2023: On 50% FiO2.  Diurese with 40 mg IV Lasix x1 dose.  Triglycerides 702, d/c Propofol and change to Dilaudid, Precedex with prn versed pushes.  Developed A.fib with RVR, converted back to NSR with Amiodarone bous. 05/11/2023: Increased WOB on WUA. 05/12/2023: On minimal vent settings, plan for WUA and SBT when family arrives.  Will diurese with 40 mg IV Lasix x1 dose.   05/13/23- for SBT today, opening eyes but not consistently following verbal communication.  Family  conference today.  Patient re-evaluated during SBT with passing parameters and liberated from MV.  Extubated 05/13/23 - Met with family reviewed overall comorbid and poor long term prognosis.  05/14/23- patient had slp with diet ordered, behavior is better mentation imrproved  Interim History / Subjective:  As outlined above under significant events   Objective   Blood pressure (!) 121/48, pulse (!) 154, temperature 98.6 F (37 C), temperature source Oral, resp. rate 19, height 5' 7.01" (1.702 m), weight 71.1 kg, SpO2 (!) 87%.    FiO2 (%):  [35 %] 35 %   Intake/Output Summary (Last 24 hours) at 05/15/2023 0824 Last data filed at 05/15/2023 1308 Gross per 24 hour  Intake 185.53 ml  Output 3545 ml  Net -3359.47 ml   Filed Weights   05/13/23 0430 05/14/23 0450 05/15/23 0421  Weight: 79.7 kg 78.7 kg 71.1 kg    Examination: General: NAD HENT: Atraumatic, normocephalic, neck supple, no JVD,  Lungs: Coarse throughout, even, non labored, Cardiovascular: Sinus rhythm s1s2, no m/r/g, 1+ radial/1+ distal pulses  Abdomen: +BS x4, soft, obese, slightly distended  Extremities: Normal bulk and tone, no deformities Neuro: mild confusion no FND grossly GU: Indwelling foley catheter draining yellow urine   Resolved Hospital Problem list   Rhabdomyolysis  Thrombocytopenia   Assessment & Plan:   #Acute metabolic encephalopathy- RESOLVED  - Avoid sedating medications as able - Daily wake up assessment - Continue thiamine, MVI, folic acid due to alcoholism hx  #Septic shock ~ RESOLVED #Intermittent atrial fibrillation with rvr ~ CONVERTED TO NSR #Elevated Troponin, suspect demand ischemia Echocardiogram 04/26/23: LVEF >55%, diastolic function unable to be evaluated, RV systolic function mildly reduced, RV size is normal -Continuous cardiac monitoring -Maintain MAP >65 -Vasopressors as needed to maintain MAP goal -Diuresis as BP and renal function permits - Prn metoprolol for sustained hr  >120 bpm - Troponin peaked at 885  -TSH normal at 1.2 on 04/26/23  #Acute hypoxic respiratory failure -IMPROVED         Extubated 05/13/23  #Aspiration pneumonia (Trach aspirate with Moraxella Ctarrhalis and proteus Mirabilis, gram stain w/ Gram + Cocci)  #Aspiration pneumonia ((Trach aspirate with Moraxella Ctarrhalis and proteus Mirabilis, gram stain w/ Gram + Cocci ) ~ TREATED -Monitor fever curve -Trend WBC's & Procalcitonin -Follow cultures as above -Continue empiric Zosyn pending cultures & sensitivities   #Alcohol withdrawal syndrome On librium taper . S/p IV ativan 1mg  today   #AKI~RESOLVED #Hypernatremia~RESVOLED  #Hypokalemia - Trend BMP  - Replace electrolytes as indicated  - Strict I&O's  - Continue free water flush to 200 ml q4hrs  - Replace electrolytes as indicated~pharmacy following for assistance  with electrolyte replacement  #Anemia without obvious signs of bleeding  CT Abd/Pelvis 05/02/2023: without any sign of intraperitoneal or retroperitoneal hematoma - Trend CBC - VTE px: subcutaneous lovenox  - Monitor for s/sx of bleeding  - Transfuse for Hgb <8 - GI consulted, appreciate input: No indication for EGD currently, continue PPI BID for SUP, if further drop in Hgb recommends Tagged RBC scan  #Hyperglycemia  #Hypoglycemia~resolved   - CBG's q4hrs  - Sensitive SSI  - Follow hyper/hypoglycemic protocol    Best Practice (right click and "Reselect all SmartList Selections" daily)   Diet/type: tubefeeds DVT prophylaxis: LMWH GI prophylaxis: PPI Lines: Yes and still needed  Foley: Yes and still needed  Code Status:  DNR Last date of multidisciplinary goals of care discussion [05/12/23]   Labs   CBC: Recent Labs  Lab 05/10/23 0418 05/11/23 0334 05/13/23 0428 05/14/23 0445 05/15/23 0432  WBC 8.3 6.8 9.3 10.1 12.2*  HGB 7.7* 7.3* 7.3* 7.3* 8.3*  HCT 24.2* 23.9* 23.1* 23.1* 26.9*  MCV 108.0* 106.7* 102.2* 104.5* 104.7*  PLT 197 210 223 170 311     Basic Metabolic Panel: Recent Labs  Lab 05/11/23 0334 05/12/23 0757 05/13/23 0428 05/13/23 1413 05/14/23 0445 05/15/23 0432  NA 146* 143 144  --  144 147*  K 3.4* 3.3* 3.2* 4.1 3.5 3.1*  CL 108 107 105  --  109 103  CO2 27 28 29   --  28 26  GLUCOSE 151* 145* 134*  --  120* 89  BUN 33* 30* 30*  --  25* 19  CREATININE 1.24 1.01 1.02  --  0.78 0.89  CALCIUM 8.6* 8.5* 8.5*  --  8.4* 8.7*  MG  --  1.6* 1.7 1.8 1.7 1.6*  PHOS 3.1 2.3* 2.8  --  2.4* 2.8   GFR: Estimated Creatinine Clearance: 73.2 mL/min (by C-G formula based on SCr of 0.89 mg/dL). Recent Labs  Lab 05/08/23 1213 05/09/23 0421 05/11/23 0334 05/13/23 0428 05/14/23 0445 05/15/23 0432  PROCALCITON 0.35  --   --   --   --   --   WBC  --    < > 6.8 9.3 10.1 12.2*  LATICACIDVEN 0.9  --   --   --   --   --    < > = values in this interval not displayed.    Liver Function Tests: Recent Labs  Lab 05/09/23 0421 05/10/23 0418 05/11/23 0334  ALBUMIN 2.1* 2.2* 2.2*   No results for input(s): "LIPASE", "AMYLASE" in the last 168 hours. No results for input(s): "AMMONIA" in the last 168 hours.  ABG    Component Value Date/Time   PHART 7.41 05/08/2023 0947   PCO2ART 39 05/08/2023 0947   PO2ART 94 05/08/2023 0947   HCO3 24.7 05/08/2023 0947   ACIDBASEDEF 0.6 05/08/2023 0430   O2SAT 99.6 05/08/2023 0947     Coagulation Profile: No results for input(s): "INR", "PROTIME" in the last 168 hours.  Cardiac Enzymes: No results for input(s): "CKTOTAL", "CKMB", "CKMBINDEX", "TROPONINI" in the last 168 hours.  HbA1C: Hgb A1c MFr Bld  Date/Time Value Ref Range Status  04/27/2023 04:24 AM 5.1 4.8 - 5.6 % Final    Comment:    (NOTE) Pre diabetes:          5.7%-6.4%  Diabetes:              >6.4%  Glycemic control for   <7.0% adults with diabetes   04/26/2023 03:25 AM 5.1 4.8 - 5.6 %  Final    Comment:    (NOTE) Pre diabetes:          5.7%-6.4%  Diabetes:              >6.4%  Glycemic control for    <7.0% adults with diabetes     CBG: Recent Labs  Lab 05/14/23 1127 05/14/23 1605 05/14/23 2021 05/14/23 2334 05/15/23 0351  GLUCAP 94 105* 101* 77 71    Review of Systems:   Unable to assess due to AMS/Intubation/sedation    Past Medical History:  He,  has a past medical history of EtOH dependence (HCC), History of echocardiogram, and Incarcerated inguinal hernia.   Surgical History:   Past Surgical History:  Procedure Laterality Date   INGUINAL HERNIA REPAIR Right 11/01/2016   Procedure: HERNIA REPAIR INGUINAL INCARCERATED;  Surgeon: Leafy Ro, MD;  Location: [facility] ORS;  Service: General;  Laterality: Right;   NO PAST SURGERIES       Social History:   reports that he has been smoking cigarettes. His smokeless tobacco use includes chew. He reports current alcohol use of about 126.0 standard drinks of alcohol per week. He reports that he does not use drugs.   Family History:  His Family history is unknown by patient.   Allergies Allergies  Allergen Reactions   Robinul [Glycopyrrolate] Other (See Comments)    Arrhythmia's     Home Medications  Prior to Admission medications   Not on File     Critical care provider statement:   Total critical care time: 31 minutes   Performed by: Karna Christmas MD   Critical care time was exclusive of separately billable procedures and treating other patients.   Critical care was necessary to treat or prevent imminent or life-threatening deterioration.   Critical care was time spent personally by me on the following activities: development of treatment plan with patient and/or surrogate as well as nursing, discussions with consultants, evaluation of patient's response to treatment, examination of patient, obtaining history from patient or surrogate, ordering and performing treatments and interventions, ordering and review of laboratory studies, ordering and review of radiographic studies, pulse oximetry and re-evaluation of  patient's condition.    Vida Rigger, M.D.  Pulmonary & Critical Care Medicine

## 2023-05-15 NOTE — Progress Notes (Addendum)
Daily Progress Note   Patient Name: Curtis [name]       Date: 05/15/2023 DOB: February 23, 1956  Age: 68 y.o. MRN#: [id number] Attending Physician: Vida Rigger, MD Primary Care Physician: Patient, No Pcp Per Admit Date: 04/25/2023  Reason for Consultation/Follow-up: Establishing goals of care  Subjective: Into see patient.  No family at bedside.  One-way extubation completed on Monday the 27th as planned.  Patient continues to do well from a pulmonary standpoint.  Continue current care.     Length of Stay: 20  Current Medications: Scheduled Meds:   Chlorhexidine Gluconate Cloth  6 each Topical Nightly   enoxaparin (LOVENOX) injection  40 mg Subcutaneous Q24H   folic acid  1 mg Per Tube Daily   insulin aspart  0-9 Units Subcutaneous Q4H   ipratropium-albuterol  3 mL Nebulization TID   methylPREDNISolone (SOLU-MEDROL) injection  20 mg Intravenous Daily   metoprolol tartrate  12.5 mg Per Tube BID   multivitamin with minerals  1 tablet Per Tube Daily   mouth rinse  15 mL Mouth Rinse Q2H   pantoprazole (PROTONIX) IV  40 mg Intravenous Q12H   QUEtiapine  50 mg Oral QHS   sodium chloride flush  10-40 mL Intracatheter Q12H   thiamine  100 mg Per Tube Daily    Continuous Infusions:   PRN Meds: acetaminophen, acetaminophen, guaiFENesin, hydrALAZINE, metoprolol tartrate, ondansetron (ZOFRAN) IV, mouth rinse, polyethylene glycol, sodium chloride flush  Physical Exam Pulmonary:     Effort: Pulmonary effort is normal.  Skin:    General: Skin is warm and dry.  Neurological:     Mental Status: He is alert.     Comments: Mitten restraints in place             Vital Signs: BP (!) 121/48   Pulse (!) 154   Temp 98.9 F (37.2 C) (Oral)   Resp 19   Ht 5' 7.01" (1.702 m)   Wt 71.1 kg    SpO2 (!) 87%   BMI 24.54 kg/m  SpO2: SpO2: (!) 87 % O2 Device: O2 Device: High Flow Nasal Cannula O2 Flow Rate: O2 Flow Rate (L/min): 6 L/min  Intake/output summary:  Intake/Output Summary (Last 24 hours) at 05/15/2023 1043 Last data filed at 05/15/2023 0900 Gross per 24 hour  Intake 74.19 ml  Output 4220 ml  Net -4145.81 ml   LBM: Last BM Date : 05/15/23 Baseline Weight: Weight: 79 kg Most recent weight: Weight: 71.1 kg    Patient Active Problem List   Diagnosis Date Noted   Demand ischemia (HCC) 04/27/2023   Shock circulatory (HCC) 04/26/2023   Sepsis with encephalopathy (HCC) 04/26/2023   Hypothermia 04/26/2023   Non-traumatic rhabdomyolysis 04/26/2023   AKI (acute kidney injury) (HCC) 04/26/2023   NSTEMI (non-ST elevated myocardial infarction) (HCC) 04/26/2023   Urinary tract infection, acute 04/26/2023   Acute metabolic encephalopathy 04/25/2023   Incarcerated hernia 11/02/2016   Malnutrition of moderate degree 11/02/2016   Incarcerated inguinal hernia    SBO (small bowel obstruction) (HCC)    Incarcerated right inguinal hernia    Seizures (HCC) 06/26/2016   Non-recurrent unilateral inguinal hernia without obstruction or gangrene    Alcohol withdrawal (HCC) 06/21/2016    Palliative Care Assessment & Plan    Recommendations/Plan: One-way extubation completed on Monday patient continues to do well from a respiratory standpoint. Continue current care DNR/DNI.  Code Status:    Code Status Orders  (From admission, onward)           Start     Ordered   05/14/23 1855  Do not attempt resuscitation (DNR)- Limited -Do Not Intubate (DNI)  (Code Status)  Continuous       Question Answer Comment  If pulseless and not breathing No CPR or chest compressions.   In Pre-Arrest Conditions (Patient Is Breathing and Has A Pulse) Do not intubate. Provide all appropriate non-invasive medical interventions. Avoid ICU transfer unless indicated or required.   Consent:  Discussion documented in EHR or advanced directives reviewed      05/14/23 1854           Code Status History     Date Active Date Inactive Code Status Order ID Comments User Context   05/09/2023 1337 05/14/2023 1854 Do not attempt resuscitation (DNR) PRE-ARREST INTERVENTIONS DESIRED [id number]  Morton Stall, NP Inpatient   04/25/2023 2011 05/09/2023 1337 Full Code [id number]  Jimmye Norman, NP ED   05/15/2017 0931 05/16/2017 0413 Full Code [id number]  Myrna Blazer, MD ED   11/02/2016 0132 11/03/2016 1823 Full Code [id number]  Leafy Ro, MD Inpatient   11/01/2016 2316 11/02/2016 0132 Full Code [id number]  Myrna Blazer, MD ED   06/26/2016 2324 06/28/2016 1556 Full Code [id number]  Oralia Manis, MD Inpatient   06/21/2016 1511 06/25/2016 1929 Full Code [id number]  Enedina Finner, MD Inpatient       Thank you for allowing the Palliative Medicine Team to assist in the care of this patient.    Morton Stall, NP  Please contact Palliative Medicine Team phone at [phone] for questions and concerns.

## 2023-05-15 NOTE — Progress Notes (Signed)
Nutrition Follow Up Note   DOCUMENTATION CODES:   Not applicable  INTERVENTION:   Nepro Shake po TID, each supplement provides 425 kcal and 19 grams protein  MVI po daily   Vitamin C 500mg  po BID   Pt at high refeed risk; recommend monitor potassium, magnesium and phosphorus labs daily until stable  NUTRITION DIAGNOSIS:   Inadequate oral intake related to inability to eat (pt sedated and ventilated) as evidenced by NPO status. -ongoing   GOAL:   Patient will meet greater than or equal to 90% of their needs -previously met with tube feeds  MONITOR:   PO intake, Supplement acceptance, Labs, Weight trends, I & O's, Skin  ASSESSMENT:   68 y/o male with h/o SBO secondary to strangulated inguinal hernia s/p repair 2018, etoh abuse and seizures who is admitted with AMS, aspiration pneumonia, sepsis, AKI and  rhabdomyolysis.  Pt seen by SLP and initiated on a diet. Pt eating only bites of meals. Pt noted to have vomited yesterday but none noted today. RD will add supplements and vitamins to help pt meet his estimated needs. Pt remains at refeed risk. Will need to consider NGT placement and nutrition support if appetite remains poor. Per chart, pt appears weight stable since admission.   Medications reviewed and include: lovenox, folic acid, insulin, solu-medrol, MVI, protonix, thiamine  Labs reviewed: Na 147(H), K 3.1(L), P 2.8 wnl, Mg 1.6(L) wbc- 12.2(H), Hgb 8.3(L), Hct 26.9(L)  Diet Order:   Diet Order             DIET - DYS 1 Room service appropriate? No; Fluid consistency: Nectar Thick  Diet effective now                  EDUCATION NEEDS:   No education needs have been identified at this time  Skin:  Skin Assessment: Reviewed RN Assessment (Stage II buttocks, laceration L knee)  Last BM:  1/29- via rectal tube  Height:   Ht Readings from Last 1 Encounters:  05/03/23 5' 7.01" (1.702 m)    Weight:   Wt Readings from Last 1 Encounters:  05/15/23  71.1 kg    Ideal Body Weight:  67.2 kg  BMI:  Body mass index is 24.54 kg/m.  Estimated Nutritional Needs:   Kcal:  1900-2200kcal/day  Protein:  95-110g/day  Fluid:  1.8-2.0 L  Betsey Holiday MS, RD, LDN If unable to be reached, please send secure chat to "RD inpatient" available from 8:00a-4:00p daily

## 2023-05-15 NOTE — Plan of Care (Signed)
  Problem: Education: Goal: Knowledge of General Education information will improve Description: Including pain rating scale, medication(s)/side effects and non-pharmacologic comfort measures Outcome: Not Progressing   Problem: Health Behavior/Discharge Planning: Goal: Ability to manage health-related needs will improve Outcome: Not Progressing   Problem: Clinical Measurements: Goal: Ability to maintain clinical measurements within normal limits will improve Outcome: Not Progressing Goal: Will remain free from infection Outcome: Not Progressing Goal: Diagnostic test results will improve Outcome: Not Progressing Goal: Respiratory complications will improve Outcome: Not Progressing Goal: Cardiovascular complication will be avoided Outcome: Not Progressing   Problem: Activity: Goal: Risk for activity intolerance will decrease Outcome: Not Progressing   Problem: Nutrition: Goal: Adequate nutrition will be maintained Outcome: Not Progressing   Problem: Coping: Goal: Level of anxiety will decrease Outcome: Not Progressing   Problem: Elimination: Goal: Will not experience complications related to bowel motility Outcome: Not Progressing Goal: Will not experience complications related to urinary retention Outcome: Not Progressing   Problem: Pain Management: Goal: General experience of comfort will improve Outcome: Not Progressing   Problem: Safety: Goal: Ability to remain free from injury will improve Outcome: Not Progressing   Problem: Education: Goal: Ability to describe self-care measures that may prevent or decrease complications (Diabetes Survival Skills Education) will improve Outcome: Not Progressing   Problem: Skin Integrity: Goal: Risk for impaired skin integrity will decrease Outcome: Not Progressing   Problem: Coping: Goal: Ability to adjust to condition or change in health will improve Outcome: Not Progressing   Problem: Fluid Volume: Goal: Ability to  maintain a balanced intake and output will improve Outcome: Not Progressing   Problem: Health Behavior/Discharge Planning: Goal: Ability to identify and utilize available resources and services will improve Outcome: Not Progressing Goal: Ability to manage health-related needs will improve Outcome: Not Progressing   Problem: Metabolic: Goal: Ability to maintain appropriate glucose levels will improve Outcome: Not Progressing   Problem: Nutritional: Goal: Maintenance of adequate nutrition will improve Outcome: Not Progressing Goal: Progress toward achieving an optimal weight will improve Outcome: Not Progressing

## 2023-05-15 NOTE — Progress Notes (Signed)
PHARMACY CONSULT NOTE  Pharmacy Consult for Electrolyte Monitoring and Replacement   Recent Labs: Potassium (mmol/L)  Date Value  05/15/2023 3.1 (L)  08/23/2013 3.3 (L)   Magnesium (mg/dL)  Date Value  08/65/7846 1.6 (L)  08/23/2013 1.4 (L)   Calcium (mg/dL)  Date Value  96/29/5284 8.7 (L)   Calcium, Total (mg/dL)  Date Value  13/24/4010 9.5   Albumin (g/dL)  Date Value  27/25/3664 2.2 (L)   Phosphorus (mg/dL)  Date Value  40/34/7425 2.8  08/23/2013 2.2 (L)   Sodium (mmol/L)  Date Value  05/15/2023 147 (H)  08/23/2013 128 (L)   Assessment: 68 y/o male with h/o SBO secondary to strangulated inguinal hernia s/p repair 2018, etoh abuse and seizures who is admitted with AMS, aspiration pneumonia, sepsis, AKI and rhabdomyolysis. Pharmacy is asked to follow and replace electrolytes while in CCU  Goal of Therapy:  Electrolytes WNL  Plan:  --4 grams IV magnesium sulfate x 1 --40 mEq po KCl x 2 --recheck electrolytes again with AM labs tomorrow  Lowella Bandy 05/15/2023 7:16 AM

## 2023-05-15 NOTE — Progress Notes (Signed)
Elvina Sidle notified of patient going in and out of Afib heart rate 160's. At this time no additional orders. Elvina Sidle to will review and assess patient.

## 2023-05-15 NOTE — Progress Notes (Signed)
MD notified: The patient was given 12.5 mg of schedule metoprolol oral and blood pressure kept maintaining above 140's to 150's. PRN 5mg  of IV metoprolol given. Current heart rate jump from the 90's to the 150's.

## 2023-05-16 DIAGNOSIS — Z7189 Other specified counseling: Secondary | ICD-10-CM | POA: Diagnosis not present

## 2023-05-16 DIAGNOSIS — G9341 Metabolic encephalopathy: Secondary | ICD-10-CM | POA: Diagnosis not present

## 2023-05-16 LAB — CBC
HCT: 27.7 % — ABNORMAL LOW (ref 39.0–52.0)
Hemoglobin: 8.7 g/dL — ABNORMAL LOW (ref 13.0–17.0)
MCH: 32.1 pg (ref 26.0–34.0)
MCHC: 31.4 g/dL (ref 30.0–36.0)
MCV: 102.2 fL — ABNORMAL HIGH (ref 80.0–100.0)
Platelets: 316 10*3/uL (ref 150–400)
RBC: 2.71 MIL/uL — ABNORMAL LOW (ref 4.22–5.81)
RDW: 15.2 % (ref 11.5–15.5)
WBC: 9.9 10*3/uL (ref 4.0–10.5)
nRBC: 0 % (ref 0.0–0.2)

## 2023-05-16 LAB — GLUCOSE, CAPILLARY
Glucose-Capillary: 84 mg/dL (ref 70–99)
Glucose-Capillary: 99 mg/dL (ref 70–99)
Glucose-Capillary: 105 mg/dL — ABNORMAL HIGH (ref 70–99)
Glucose-Capillary: 121 mg/dL — ABNORMAL HIGH (ref 70–99)
Glucose-Capillary: 145 mg/dL — ABNORMAL HIGH (ref 70–99)
Glucose-Capillary: 95 mg/dL (ref 70–99)

## 2023-05-16 LAB — BASIC METABOLIC PANEL
Anion gap: 13 (ref 5–15)
BUN: 13 mg/dL (ref 8–23)
CO2: 24 mmol/L (ref 22–32)
Calcium: 9.2 mg/dL (ref 8.9–10.3)
Chloride: 109 mmol/L (ref 98–111)
Creatinine, Ser: 0.84 mg/dL (ref 0.61–1.24)
GFR, Estimated: >60 mL/min (ref >60)
Glucose, Bld: 102 mg/dL — ABNORMAL HIGH (ref 70–99)
Potassium: 3.2 mmol/L — ABNORMAL LOW (ref 3.5–5.1)
Sodium: 146 mmol/L — ABNORMAL HIGH (ref 135–145)

## 2023-05-16 LAB — PHOSPHORUS: Phosphorus: 2.2 mg/dL — ABNORMAL LOW (ref 2.5–4.6)

## 2023-05-16 LAB — MAGNESIUM: Magnesium: 2 mg/dL (ref 1.7–2.4)

## 2023-05-16 MED ORDER — PREDNISONE 50 MG PO TABS
45.0000 mg | ORAL_TABLET | Freq: Every day | ORAL | Status: AC
  Administered 2023-05-18: 45 mg via ORAL
  Filled 2023-05-16: qty 1

## 2023-05-16 MED ORDER — APIXABAN 5 MG PO TABS
5.0000 mg | ORAL_TABLET | Freq: Two times a day (BID) | ORAL | Status: DC
  Administered 2023-05-16 – 2023-05-21 (×11): 5 mg via ORAL
  Filled 2023-05-16 (×11): qty 1

## 2023-05-16 MED ORDER — PREDNISONE 20 MG PO TABS: 20.0000 mg | ORAL_TABLET | Freq: Every day | ORAL | Status: DC

## 2023-05-16 MED ORDER — AMIODARONE IV BOLUS ONLY 150 MG/100ML
150.0000 mg | Freq: Once | INTRAVENOUS | Status: AC
  Administered 2023-05-16: 150 mg via INTRAVENOUS
  Filled 2023-05-16: qty 100

## 2023-05-16 MED ORDER — PREDNISONE 50 MG PO TABS: 25.0000 mg | ORAL_TABLET | Freq: Every day | ORAL | Status: DC

## 2023-05-16 MED ORDER — PREDNISONE 20 MG PO TABS
50.0000 mg | ORAL_TABLET | Freq: Every day | ORAL | Status: AC
  Administered 2023-05-17: 50 mg via ORAL
  Filled 2023-05-16: qty 1

## 2023-05-16 MED ORDER — PREDNISONE 10 MG PO TABS: 5.0000 mg | ORAL_TABLET | Freq: Every day | ORAL | Status: DC

## 2023-05-16 MED ORDER — NICOTINE 21 MG/24HR TD PT24
21.0000 mg | MEDICATED_PATCH | Freq: Every day | TRANSDERMAL | Status: DC
  Administered 2023-05-16 – 2023-05-21 (×5): 21 mg via TRANSDERMAL
  Filled 2023-05-16 (×6): qty 1

## 2023-05-16 MED ORDER — PREDNISONE 20 MG PO TABS
40.0000 mg | ORAL_TABLET | Freq: Every day | ORAL | Status: AC
  Administered 2023-05-19: 40 mg via ORAL
  Filled 2023-05-16: qty 2

## 2023-05-16 MED ORDER — PREDNISONE 20 MG PO TABS
30.0000 mg | ORAL_TABLET | Freq: Every day | ORAL | Status: AC
  Administered 2023-05-21: 30 mg via ORAL
  Filled 2023-05-16: qty 1

## 2023-05-16 MED ORDER — POTASSIUM CHLORIDE 10 MEQ/50ML IV SOLN
10.0000 meq | INTRAVENOUS | Status: AC
  Administered 2023-05-16 (×4): 10 meq via INTRAVENOUS
  Filled 2023-05-16 (×4): qty 50

## 2023-05-16 MED ORDER — METOPROLOL TARTRATE 5 MG/5ML IV SOLN
5.0000 mg | Freq: Once | INTRAVENOUS | Status: AC
  Administered 2023-05-16: 5 mg via INTRAVENOUS

## 2023-05-16 MED ORDER — K PHOS MONO-SOD PHOS DI & MONO 155-852-130 MG PO TABS
500.0000 mg | ORAL_TABLET | Freq: Four times a day (QID) | ORAL | Status: AC
  Administered 2023-05-16 (×2): 500 mg via ORAL
  Filled 2023-05-16 (×2): qty 2

## 2023-05-16 MED ORDER — PREDNISONE 10 MG PO TABS: 15.0000 mg | ORAL_TABLET | Freq: Every day | ORAL | Status: DC

## 2023-05-16 MED ORDER — ORAL CARE MOUTH RINSE: 15.0000 mL | OROMUCOSAL | Status: DC | PRN

## 2023-05-16 MED ORDER — PREDNISONE 50 MG PO TABS
35.0000 mg | ORAL_TABLET | Freq: Every day | ORAL | Status: AC
  Administered 2023-05-20: 35 mg via ORAL
  Filled 2023-05-16: qty 1

## 2023-05-16 MED ORDER — LABETALOL HCL 5 MG/ML IV SOLN
10.0000 mg | INTRAVENOUS | Status: DC | PRN
  Administered 2023-05-16 – 2023-05-17 (×2): 20 mg via INTRAVENOUS
  Filled 2023-05-16 (×2): qty 4

## 2023-05-16 MED ORDER — PREDNISONE 10 MG PO TABS: 10.0000 mg | ORAL_TABLET | Freq: Every day | ORAL | Status: DC

## 2023-05-16 MED ORDER — BENZTROPINE MESYLATE 1 MG PO TABS
1.0000 mg | ORAL_TABLET | Freq: Two times a day (BID) | ORAL | Status: DC
  Administered 2023-05-16 – 2023-05-17 (×4): 1 mg via ORAL
  Filled 2023-05-16 (×5): qty 1

## 2023-05-16 MED ORDER — HYDRALAZINE HCL 20 MG/ML IJ SOLN
10.0000 mg | INTRAMUSCULAR | Status: DC | PRN
  Administered 2023-05-16 (×2): 20 mg via INTRAVENOUS
  Administered 2023-05-17: 10 mg via INTRAVENOUS
  Administered 2023-05-19: 20 mg via INTRAVENOUS
  Filled 2023-05-16 (×4): qty 1

## 2023-05-16 MED ORDER — POTASSIUM CHLORIDE 20 MEQ PO PACK
40.0000 meq | PACK | Freq: Once | ORAL | Status: AC
  Administered 2023-05-16: 40 meq via ORAL
  Filled 2023-05-16: qty 2

## 2023-05-16 MED ORDER — ORAL CARE MOUTH RINSE
15.0000 mL | OROMUCOSAL | Status: DC
  Administered 2023-05-16 – 2023-05-21 (×20): 15 mL via OROMUCOSAL

## 2023-05-16 MED ORDER — METOPROLOL TARTRATE 25 MG PO TABS
25.0000 mg | ORAL_TABLET | Freq: Two times a day (BID) | ORAL | Status: DC
  Administered 2023-05-16 – 2023-05-17 (×3): 25 mg via ORAL
  Filled 2023-05-16 (×3): qty 1

## 2023-05-16 MED ORDER — IPRATROPIUM-ALBUTEROL 0.5-2.5 (3) MG/3ML IN SOLN
3.0000 mL | Freq: Two times a day (BID) | RESPIRATORY_TRACT | Status: DC
  Administered 2023-05-17 – 2023-05-18 (×3): 3 mL via RESPIRATORY_TRACT
  Filled 2023-05-16 (×3): qty 3

## 2023-05-16 NOTE — Evaluation (Signed)
Physical Therapy Evaluation Patient Details Name: Cirilo Canner MRN: [id number] DOB: Feb 20, 1956 Today's Date: 05/16/2023  History of Present Illness  Patient is a 68 year old male found unresponsive, hypothermic, acute hypoxic respiratory failure with intubation required, extubated 05/07/23, re- intubated, and extubated again 05/13/23. History of inguinal hernia s/p repair, SBO, alcohol withdrawal seizure, EtOH abuse.  Clinical Impression  Patient is agreeable to PT evaluation. Daughter at the bedside confirms that patient lives alone and is independent with ambulation at baseline. He does not drive.  The patient has some confusion but is cooperative with assessment. He needs Max A to sit up on edge of bed. Poor sitting balance with some possible involuntary trunk rocking back and forth noted. Sitting tolerance is no more than 3 minutes. He will likely require +2 person assistance for standing when ready to attempt. Recommend to continue PT to maximize independence and facilitate return to prior level of function. Consider rehabilitation < 3 hours/day after this hospital stay.       If plan is discharge home, recommend the following: Two people to help with walking and/or transfers;Two people to help with bathing/dressing/bathroom;Assistance with cooking/housework;Help with stairs or ramp for entrance;Supervision due to cognitive status   Can travel by private vehicle   No    Equipment Recommendations  (to be determined at next level of care)  Recommendations for Other Services       Functional Status Assessment Patient has had a recent decline in their functional status and demonstrates the ability to make significant improvements in function in a reasonable and predictable amount of time.     Precautions / Restrictions Precautions Precautions: Fall Restrictions Weight Bearing Restrictions Per Provider Order: No      Mobility  Bed Mobility Overal bed mobility: Needs Assistance Bed  Mobility: Rolling, Sit to Supine, Sit to Sidelying Rolling: Total assist, Mod assist (Total A for rolling to left with resistance despite faciliation. Mod A for rolling to R)     Sit to supine: Max assist Sit to sidelying: Max assist General bed mobility comments: verbal cues for sequencing, task initiation.    Transfers                   General transfer comment: not assessed due to poor sitting tolerance    Ambulation/Gait                  Stairs            Wheelchair Mobility     Tilt Bed    Modified Rankin (Stroke Patients Only)       Balance Overall balance assessment: Needs assistance Sitting-balance support: Feet supported Sitting balance-Leahy Scale: Poor Sitting balance - Comments: Mod A- CGA provided for safety. there appears to be some invomunatry trunk movements with rocking back and forth.                                     Pertinent Vitals/Pain Pain Assessment Pain Assessment: No/denies pain    Home Living Family/patient expects to be discharged to:: Private residence Living Arrangements: Alone Available Help at Discharge:  (unsure) Type of Home: House           Home Equipment: None Additional Comments: information is per daughter in the room    Prior Function Prior Level of Function : Independent/Modified Independent  Mobility Comments: walking without device, no driving       Extremity/Trunk Assessment   Upper Extremity Assessment Upper Extremity Assessment: Generalized weakness (Tardive dyskinesia- like movements at time throughout UE and trunk)    Lower Extremity Assessment Lower Extremity Assessment: Generalized weakness       Communication   Communication Communication: Difficulty communicating thoughts/reduced clarity of speech;Difficulty following commands/understanding Following commands: Follows one step commands with increased time Cueing Techniques: Verbal  cues;Tactile cues;Visual cues  Cognition Arousal: Alert Behavior During Therapy: Anxious Overall Cognitive Status: Impaired/Different from baseline Area of Impairment: Following commands, Orientation, Memory, Safety/judgement, Problem solving                 Orientation Level: Disoriented to, Situation, Time   Memory: Decreased short-term memory Following Commands: Follows one step commands with increased time Safety/Judgement: Decreased awareness of safety, Decreased awareness of deficits   Problem Solving: Slow processing, Decreased initiation, Difficulty sequencing, Requires verbal cues, Requires tactile cues General Comments: patient talks about going home to build a fire in his wood stove. needs re-direction at times. cooperative overall        General Comments General comments (skin integrity, edema, etc.): no significant change in vitals with activity. patient reports mild dizziness with upright activity. sitting tolerance limited by fatigue for less than 3 minutes    Exercises     Assessment/Plan    PT Assessment Patient needs continued PT services  PT Problem List Decreased range of motion;Decreased strength;Decreased activity tolerance;Decreased balance;Decreased mobility;Decreased safety awareness;Decreased cognition       PT Treatment Interventions DME instruction;Gait training;Stair training;Functional mobility training;Therapeutic exercise;Balance training;Therapeutic activities;Cognitive remediation;Neuromuscular re-education;Wheelchair mobility training;Patient/family education    PT Goals (Current goals can be found in the Care Plan section)  Acute Rehab PT Goals Patient Stated Goal: to go home PT Goal Formulation: With patient Time For Goal Achievement: 05/30/23 Potential to Achieve Goals: Good    Frequency Min 1X/week     Co-evaluation               AM-PAC PT "6 Clicks" Mobility  Outcome Measure Help needed turning from your back to your  side while in a flat bed without using bedrails?: A Lot Help needed moving from lying on your back to sitting on the side of a flat bed without using bedrails?: A Lot Help needed moving to and from a bed to a chair (including a wheelchair)?: Total Help needed standing up from a chair using your arms (e.g., wheelchair or bedside chair)?: Total Help needed to walk in hospital room?: Total Help needed climbing 3-5 steps with a railing? : Total 6 Click Score: 8    End of Session   Activity Tolerance: Patient limited by fatigue Patient left: in bed;with call bell/phone within reach;with bed alarm set Nurse Communication: Mobility status PT Visit Diagnosis: Muscle weakness (generalized) (M62.81);Unsteadiness on feet (R26.81)    Time: 1191-4782 PT Time Calculation (min) (ACUTE ONLY): 19 min   Charges:   PT Evaluation $PT Eval High Complexity: 1 High PT Treatments $Therapeutic Activity: 8-22 mins PT General Charges $$ ACUTE PT VISIT: 1 Visit        Donna Bernard, PT, MPT   Ina Homes 05/16/2023, 2:27 PM

## 2023-05-16 NOTE — Evaluation (Signed)
Occupational Therapy Evaluation Patient Details Name: Curtis [name] MRN: [id number] DOB: 1956-01-22 Today's Date: 05/16/2023   History of Present Illness Patient is a 68 year old male found unresponsive, hypothermic, acute hypoxic respiratory failure with intubation required, extubated 05/07/23, re- intubated, and extubated again 05/13/23. History of inguinal hernia s/p repair, SBO, alcohol withdrawal seizure, EtOH abuse.   Clinical Impression   Curtis [name] was seen for OT evaluation this date. Prior to hospital admission, pt was IND. Pt lives alone. Oriented to self and location as hospital, follows 1 step commands with cues and time. On arrival pt completing PT evaluation with poor tolerance noted. TOTAL Ax2 rolling to L side with significant difficulty sequencing. Pt currently requires SETUP bed level face washing with cues. Pt would benefit from skilled OT to address noted impairments and functional limitations (see below for any additional details). Upon hospital discharge, recommend OT follow up <3 hours/day.    If plan is discharge home, recommend the following: Two people to help with walking and/or transfers;Two people to help with bathing/dressing/bathroom    Functional Status Assessment  Patient has had a recent decline in their functional status and demonstrates the ability to make significant improvements in function in a reasonable and predictable amount of time.  Equipment Recommendations  Other (comment) (defer)    Recommendations for Other Services       Precautions / Restrictions Precautions Precautions: Fall Restrictions Weight Bearing Restrictions Per Provider Order: No      Mobility Bed Mobility Overal bed mobility: Needs Assistance Bed Mobility: Rolling Rolling: Total assist, +2 for physical assistance         General bed mobility comments: significantly impaired difficulty sequencing rolling to L side    Transfers                   General  transfer comment: unsafe to attempt      Balance                                           ADL either performed or assessed with clinical judgement   ADL Overall ADL's : Needs assistance/impaired                                       General ADL Comments: SETUP bed level face washing with cues. MAX A x2 rolling for periaccess      Pertinent Vitals/Pain Pain Assessment Pain Assessment: Faces Faces Pain Scale: Hurts little more Pain Location: groin with purwick adjustment Pain Descriptors / Indicators: Grimacing Pain Intervention(s): Limited activity within patient's tolerance     Extremity/Trunk Assessment Upper Extremity Assessment Upper Extremity Assessment: Generalized weakness (Tardive dyskinesia - like movements)   Lower Extremity Assessment Lower Extremity Assessment: Generalized weakness       Communication Communication Communication: Difficulty communicating thoughts/reduced clarity of speech;Difficulty following commands/understanding Following commands: Follows one step commands with increased time Cueing Techniques: Verbal cues;Tactile cues;Visual cues   Cognition Arousal: Alert Behavior During Therapy: Anxious Overall Cognitive Status: Impaired/Different from baseline Area of Impairment: Following commands, Orientation, Memory, Safety/judgement, Problem solving                 Orientation Level: Disoriented to, Place, Time, Situation     Following Commands: Follows one step commands with increased time Safety/Judgement: Decreased awareness  of safety, Decreased awareness of deficits     General Comments: states month as June and location as [employer], does sleect hospital from [employer] Living Family/patient expects to be discharged to:: Private residence Living Arrangements: Alone Available Help at Discharge:  (unsure) Type of Home: House                        Home Equipment: None   Additional Comments: information is per daughter in the room      Prior Functioning/Environment Prior Level of Function : Independent/Modified Independent             Mobility Comments: walking without device, no driving          OT Problem List: Decreased strength;Decreased range of motion;Decreased activity tolerance;Impaired balance (sitting and/or standing)      OT Treatment/Interventions: Self-care/ADL training;Therapeutic exercise;Energy conservation;DME and/or AE instruction;Therapeutic activities    OT Goals(Current goals can be found in the care plan section) Acute Rehab OT Goals Patient Stated Goal: to eat OT Goal Formulation: With patient Time For Goal Achievement: 05/30/23 Potential to Achieve Goals: Fair ADL Goals Pt Will Perform Grooming: with mod assist;sitting Pt Will Perform Lower Body Dressing: with mod assist;sitting/lateral leans Pt Will Transfer to Toilet: with mod assist;stand pivot transfer;bedside commode  OT Frequency: Min 1X/week    Co-evaluation              AM-PAC OT "6 Clicks" Daily Activity     Outcome Measure Help from another person eating meals?: A Little Help from another person taking care of personal grooming?: A Little Help from another person toileting, which includes using toliet, bedpan, or urinal?: A Lot Help from another person bathing (including washing, rinsing, drying)?: A Lot Help from another person to put on and taking off regular upper body clothing?: A Little Help from another person to put on and taking off regular lower body clothing?: A Lot 6 Click Score: 15   End of Session    Activity Tolerance: Patient tolerated treatment well Patient left: in bed;with call bell/phone within reach;with bed alarm set  OT Visit Diagnosis: Other abnormalities of gait and mobility (R26.89);Muscle weakness (generalized) (M62.81)                Time: 1610-9604 OT Time Calculation (min): 8  min Charges:  OT General Charges $OT Visit: 1 Visit OT Evaluation $OT Eval Low Complexity: 1 Low  Kathie Dike, M.S. OTR/L  05/16/23, 4:11 PM  ascom [phone]

## 2023-05-16 NOTE — Plan of Care (Signed)
  Problem: Clinical Measurements: Goal: Ability to maintain clinical measurements within normal limits will improve Outcome: Progressing Goal: Diagnostic test results will improve Outcome: Progressing Goal: Respiratory complications will improve Outcome: Progressing   Problem: Safety: Goal: Ability to remain free from injury will improve Outcome: Progressing   Problem: Education: Goal: Knowledge of General Education information will improve Description: Including pain rating scale, medication(s)/side effects and non-pharmacologic comfort measures Outcome: Not Progressing   Problem: Clinical Measurements: Goal: Cardiovascular complication will be avoided Outcome: Not Progressing   Problem: Skin Integrity: Goal: Risk for impaired skin integrity will decrease Outcome: Not Progressing

## 2023-05-16 NOTE — Progress Notes (Signed)
Daily Progress Note   Patient Name: Curtis [name]       Date: 05/16/2023 DOB: 1956/01/20  Age: 68 y.o. MRN#: [id number] Attending Physician: Gillis Santa, MD Primary Care Physician: Patient, No Pcp Per Admit Date: 04/25/2023  Reason for Consultation/Follow-up: Establishing goals of care  Subjective: Notes and labs reviewed.  CCM treating for alcohol withdrawal syndrome-he is currently on the Librium taper.  Patient is confused.  He continues to do well from a pulmonary standpoint following movement extubation.  PMT will shadow as goals are set for DNR/DNI.  Hopeful to have goals of care discussion with patient himself when able. Length of Stay: 21  Current Medications: Scheduled Meds:   vitamin C  500 mg Oral BID   benztropine  1 mg Oral BID   chlordiazePOXIDE  25 mg Oral TID   Followed by   Melene Muller ON 05/17/2023] chlordiazePOXIDE  25 mg Oral BH-qamhs   Followed by   Melene Muller ON 05/18/2023] chlordiazePOXIDE  25 mg Oral Daily   Chlorhexidine Gluconate Cloth  6 each Topical Nightly   enoxaparin (LOVENOX) injection  40 mg Subcutaneous Q24H   feeding supplement (NEPRO CARB STEADY)  237 mL Oral TID BM   folic acid  1 mg Oral Daily   insulin aspart  0-9 Units Subcutaneous Q4H   ipratropium-albuterol  3 mL Nebulization TID   metoprolol tartrate  12.5 mg Oral BID   multivitamin with minerals  1 tablet Oral Daily   nicotine  21 mg Transdermal Daily   mouth rinse  15 mL Mouth Rinse 4 times per day   pantoprazole (PROTONIX) IV  40 mg Intravenous Q12H   [START ON 05/17/2023] predniSONE  50 mg Oral Q breakfast   Followed by   Melene Muller ON 05/18/2023] predniSONE  45 mg Oral Q breakfast   Followed by   Melene Muller ON 05/19/2023] predniSONE  40 mg Oral Q breakfast   Followed by   Melene Muller ON 05/20/2023] predniSONE   35 mg Oral Q breakfast   Followed by   Melene Muller ON 05/21/2023] predniSONE  30 mg Oral Q breakfast   Followed by   Melene Muller ON 05/22/2023] predniSONE  25 mg Oral Q breakfast   Followed by   Melene Muller ON 05/23/2023] predniSONE  20 mg Oral Q breakfast   Followed by   Melene Muller ON 05/24/2023] predniSONE  15 mg Oral Q breakfast   Followed by   Melene Muller ON 05/25/2023] predniSONE  10 mg Oral Q breakfast   Followed by   Melene Muller ON 05/26/2023] predniSONE  5 mg Oral Q breakfast   QUEtiapine  50 mg Oral QHS   sodium chloride flush  10-40 mL Intracatheter Q12H   thiamine  100 mg Oral Daily    Continuous Infusions:   PRN Meds: acetaminophen, acetaminophen, guaiFENesin, hydrALAZINE, labetalol, metoprolol tartrate, ondansetron (ZOFRAN) IV, mouth rinse, polyethylene glycol, sodium chloride flush  Physical Exam Pulmonary:     Effort: Pulmonary effort is normal.  Neurological:     Mental Status: He is alert.             Vital Signs: BP (!) 161/80   Pulse 89   Temp 98.4 F (36.9 C) (Oral)   Resp 16   Ht 5' 7.01" (1.702 m)   Wt 71.1 kg   SpO2 99%   BMI 24.54 kg/m  SpO2: SpO2: 99 % O2 Device: O2 Device: Room Air O2 Flow Rate: O2 Flow Rate (L/min): 2 L/min  Intake/output summary:  Intake/Output Summary (Last 24 hours) at 05/16/2023 1412 Last data filed at 05/16/2023 1136 Gross per 24 hour  Intake 256.92 ml  Output 1645 ml  Net -1388.08 ml   LBM: Last BM Date : 05/15/23 Baseline Weight: Weight: 79 kg Most recent weight: Weight: 71.1 kg         Patient Active Problem List   Diagnosis Date Noted   Demand ischemia (HCC) 04/27/2023   Shock circulatory (HCC) 04/26/2023   Sepsis with encephalopathy (HCC) 04/26/2023   Hypothermia 04/26/2023   Non-traumatic rhabdomyolysis 04/26/2023   AKI (acute kidney injury) (HCC) 04/26/2023   NSTEMI (non-ST elevated myocardial infarction) (HCC) 04/26/2023   Urinary tract infection, acute 04/26/2023   Acute metabolic encephalopathy 04/25/2023   Incarcerated hernia  11/02/2016   Malnutrition of moderate degree 11/02/2016   Incarcerated inguinal hernia    SBO (small bowel obstruction) (HCC)    Incarcerated right inguinal hernia    Seizures (HCC) 06/26/2016   Non-recurrent unilateral inguinal hernia without obstruction or gangrene    Alcohol withdrawal (HCC) 06/21/2016    Palliative Care Assessment & Plan    Recommendations/Plan: PMT will shadow in hopes that patient's cognitive status will improve such that he would be able to discuss goals of care himself.   Currently, goals are set for DNR/DNI.  Code Status:    Code Status Orders  (From admission, onward)           Start     Ordered   05/14/23 1855  Do not attempt resuscitation (DNR)- Limited -Do Not Intubate (DNI)  (Code Status)  Continuous       Question Answer Comment  If pulseless and not breathing No CPR or chest compressions.   In Pre-Arrest Conditions (Patient Is Breathing and Has A Pulse) Do not intubate. Provide all appropriate non-invasive medical interventions. Avoid ICU transfer unless indicated or required.   Consent: Discussion documented in EHR or advanced directives reviewed      05/14/23 1854           Code Status History     Date Active Date Inactive Code Status Order ID Comments User Context   05/09/2023 1337 05/14/2023 1854 Do not attempt resuscitation (DNR) PRE-ARREST INTERVENTIONS DESIRED [id number]  Morton Stall, NP Inpatient   04/25/2023 2011 05/09/2023 1337 Full Code [id number]  Jimmye Norman, NP ED   05/15/2017 [phone] 05/16/2017 0413 Full Code  [id number]  Myrna Blazer, MD ED   11/02/2016 0132 11/03/2016 1823 Full Code [id number]  Leafy Ro, MD Inpatient   11/01/2016 2316 11/02/2016 0132 Full Code [id number]  Myrna Blazer, MD ED   06/26/2016 2324 06/28/2016 1556 Full Code [id number]  Oralia Manis, MD Inpatient   06/21/2016 1511 06/25/2016 1929 Full Code [id number]  Enedina Finner, MD Inpatient    Thank you for allowing the Palliative  Medicine Team to assist in the care of this patient.     Morton Stall, NP  Please contact Palliative Medicine Team phone at [phone] for questions and concerns.

## 2023-05-16 NOTE — Progress Notes (Signed)
Speech Language Pathology Treatment: Dysphagia  Patient Details Name: Curtis [name] MRN: [id number] DOB: Apr 13, 1956 Today's Date: 05/16/2023 Time: 4098-1191 SLP Time Calculation (min) (ACUTE ONLY): 35 min  Assessment / Plan / Recommendation Clinical Impression  Pt seen for ongoing assessment of swallowing. He is alert, engaged w/ this SLP. Noted increased head and motor movements at Baseline impacting his ability to stay sitting fully upright/midline in bed; began leaning. He verbally responded 2-3x to what he wanted to drink but not consistently to basic questions re: self. He asked about the "[facility]" once. He did not follow commands consistently. On [place] O2 support 4L; afebrile. WBC WNL today.   Pt appears to present w/ oropharyngeal phase dysphagia in setting of Acute illness, lengthy oral intubation, and declined Cognitive awareness/engagement. Pt has significant weakness and head/body motor movements (involuntary). ANY Cognitive decline can impact overall awareness/timing of swallow and safety during po tasks which increases risk for aspiration, choking.  Pt appears at reduced risk for aspiration/aspiration pneumonia when following aspiration precautions and given feeding support using a Dysphagia(foods) diet for ease of oral phase -- pt requires FULL feeding support. He is functionally Edentulous at baseline(~2 teeth) and requires verbal/visual/tactile cues for follow through during po tasks.  Pt has been tolerating a Dysphagia level 1 w/ Nectar liquids today per NSG. Trials to upgrade to thin liquids attempted this session.  Pt explained general aspiration precautions and agreed verbally to the need for following them especially sitting upright for all oral intake though could not really help to support himself more upright for full upright sitting. Post positioning and oral care, he was eager to try sips of thin liquids-- id'd 2 drinks. No immediate, overt clinical s/s of aspiration were  noted w/ trials of thin liquids via Pinched Straw(to limit bolus size) nor puree. A half cough and throat clear were noted b/t thin liquids trials but not immediate to the swallow. NSG endorsed noting same this morning. Oral phase appeared grossly functional for bolus management and A-P transfer for swallowing; oral clearing achieved w/ trials.  No ANS changes; O2 sats remained 100% during/post trials w/ no changes in baseline RR(low 20s).  Pt required MOD+ support w/ feeding; monitoring during oral intake for readiness for next sip/bite.    Pt appears at reduced risk for aspriation when following aspiration precautions. Recommend trial of diet upgrade to thin liquids, Dysphagia level 1 w/ gravies added to moisten foods. Recommend aspiration precautions; feeding assistance and positioning upright for all oral intake. Reduce distractions during meals. Pills CRUSHED in Puree.  ST services will monitor toleration of diet while admitted; education. Suspect the Pureed consistency diet will best support nutrition needs in setting of functional Edentulous status. NSG updated, agreed. Precautions posted at bedside, chart.       HPI HPI: Pt is a 68 y.o male with significant PMH of incarcerated inguinal hernia s/p repair, SBO, alcohol withdrawal seizure, EtOH abuse who presented to the ED with chief complaints of unresponsiveness.  Pt admitted w/ dxs including: Acute Metabolic Encephalopathy In the setting of severe metabolic acidosis, severe hypothermia and hypoglycemia  #Hx of seizure-likely EtOH withdrawal seizures; Multifocal Pneumonia; Acute Hypoxic Respiratory Failure.  Per ED report, EMS was dispatched to patient's residence by a neighbor who found the patient unconscious.  On EMS arrival, patient was found unresponsive on floor of bedroom. Neighbor report LKNW was 3 days ago.  Pt was intubated, admit to ICU extremely hypotensive; hypothermia.   CXR at admit: negative for acute issues.  CXR on 1/25: Bibasilar  collapse/consolidation, right greater than left with  small right and tiny left pleural effusions.   During this admit, pt was orally intubated at admit on 04/25/23; extubated on 05/13/2023 with failed trial of extubation on 05/07/23.      SLP Plan  Continue with current plan of care      Recommendations for follow up therapy are one component of a multi-disciplinary discharge planning process, led by the attending physician.  Recommendations may be updated based on patient status, additional functional criteria and insurance authorization.    Recommendations  Diet recommendations: Dysphagia 1 (puree);Thin liquid Liquids provided via: Cup;Straw (pinched straw) Medication Administration: Crushed with puree Supervision: Staff to assist with self feeding;Full supervision/cueing for compensatory strategies Compensations: Minimize environmental distractions;Slow rate;Small sips/bites;Lingual sweep for clearance of pocketing;Follow solids with liquid Postural Changes and/or Swallow Maneuvers: Out of bed for meals;Seated upright 90 degrees;Upright 30-60 min after meal                 (Dietician f/u; Palliative Care f/u) Oral care BID;Oral care before and after PO;Staff/trained caregiver to provide oral care   Frequent or constant Supervision/Assistance Dysphagia, oropharyngeal phase (R13.12) (ETOH abuse, declined Cognitive functioning Baseline)     Continue with current plan of care       Jerilynn Som, MS, CCC-SLP Speech Language Pathologist Rehab Services; [facility] [place] [phone] (ascom) Umberto Pavek  05/16/2023, 3:20 PM

## 2023-05-16 NOTE — Progress Notes (Signed)
PHARMACY CONSULT NOTE  Pharmacy Consult for Electrolyte Monitoring and Replacement   Recent Labs: Potassium (mmol/L)  Date Value  05/16/2023 3.2 (L)  08/23/2013 3.3 (L)   Magnesium (mg/dL)  Date Value  65/78/4696 2.0  08/23/2013 1.4 (L)   Calcium (mg/dL)  Date Value  29/52/8413 9.2   Calcium, Total (mg/dL)  Date Value  24/40/1027 9.5   Albumin (g/dL)  Date Value  25/36/6440 2.2 (L)   Phosphorus (mg/dL)  Date Value  34/74/2595 2.8  08/23/2013 2.2 (L)   Sodium (mmol/L)  Date Value  05/16/2023 146 (H)  08/23/2013 128 (L)   Assessment: 68 y/o male with h/o SBO secondary to strangulated inguinal hernia s/p repair 2018, etoh abuse and seizures who is admitted with AMS, aspiration pneumonia, sepsis, AKI and rhabdomyolysis. Pharmacy is asked to follow and replace electrolytes while in CCU  Goal of Therapy:  Electrolytes WNL  Plan:  40 mEq po KCl x 1 Because this consult was generated as part of an ICU order set and patient is transferring pharmacy will sign off for now Please feel free to reach out if any further assistance is needed   Lowella Bandy 05/16/2023 7:11 AM

## 2023-05-16 NOTE — Progress Notes (Signed)
NAME:  Curtis [name], MRN:  [id number], DOB:  [birth year], LOS: 21 ADMISSION DATE:  04/25/2023, CONSULTATION DATE:  04/25/23 CHIEF COMPLAINT:  Severe Hypothermia   Brief Pt Description / Synopsis:  68 year old male patient with a past medical history of incarcerated inguinal hernia s/p repair, SBO, alcohol withdrawal seizures, EtOH abuse who presented to [facility] on 04/24/2022 for unresponsiveness. He was found to be profoundly hypothermic with temperature measuring 77 F. Subsequently was intubated on 04/24/2022. He was actively rewarmed and started on stress dose steroids. Currently normothermic. Course also complicated by rhabdomyolysis with CK peaking at 6000 and currently downtrending. Finally course complicated by acute hypoxic respiratory failure suspecting aspiration for which she is on Zosyn and doxycycline. Tracheal aspirate w/ Moraxella Cat Patient obstacle to extubation is mental status remains agitated with SAT.  History of Present Illness:  le with significant PMH of incarcerated inguinal hernia s/p repair, SBO, alcohol withdrawal seizure, EtOH abuse who presented to the ED with chief complaints of unresponsiveness.   Per ED report, EMS was dispatched to patient's residence by a neighbor who found the patient unconscious.  On EMS arrival, patient was found unresponsive on floor of bedroom. Neighbor report LKNW was 3 days ago. Patient was initially combative and would tense resist any treatment. EMS report they were unable to obtain a temperature or spo2 due to hypothermia and unable to obtain bp due to combative resistance. patient initial cbg registered at 77 treated with 1mg  glucagon repeat reading were in the upper 20's   ED Course: Initial vital signs showed HR of 55 beats/minute, BP 99/47 mm Hg, the RR 22 breaths/minute, and the oxygen saturation 100% on NRB and a temperature of 32F (25C). Patient intubated in for airway protection. Pertinent Labs/Diagnostics Findings: Na+/ K+:131/4.8   Glucose: 286 BUN/Cr.: 31/1.91 CO2 1, Anion Gap 23, AST/ALT:284/83 WBC: 15.7K/L with neutrophil predominance  Lactic acid: >9.0 COVID PCR: Negative,  troponin: 32  CK 5249 ETOH:177 VBG: pO2 46.2; pCO2 72; pH 6.96;  HCO3 16.2, %O2 Sat 46.6.  CXR> CTH> CTA Chest> CT Abd/pelvis>see results below Medication administered in the WU:[id number] given 30 cc/kg of fluids and started on broad-spectrum antibiotics with Ceftriaxone for suspected sepsis with septic shock.   05/15/23- patient on diet without aspiration events.  Nonfebrile. Had episode of Afrvr overnight, now rate controlled on amio.  He does have few electrolyte abnormalities with repletion ongoing. He seems to have development of alcohol withdrawal.  Rectal tube active.  Weaned to 6L/min [place].  Starting beta blockade for af.   05/16/23- patient with signs of Tardive dykinesia, have discussed with phamacist regarding therapy.  He is less anxious/aggitated with librium taper for EtOH withdrawal. AF is rate controlled.  Switching to po prednisone and eliquis today. 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    Pertinent  Medical History   Past Medical History:  Diagnosis Date   EtOH dependence (HCC)    History of echocardiogram    a. 04/2023 Echo: EF >55%, mildly reduced RV fxn, triv MR.   Incarcerated inguinal hernia    a. 10/2016 s/p repair.    Micro Data:  1/9: SARS-CoV-2/RSV/influenza PCR>> negative 1/9: HIV screen>> nonreactive 1/9: Blood culture x 2>> no growth 1/9: MRSA PCR>> negative 1/10: Respiratory viral panel>> negative 1/10: Mycoplasma pneumoniae IgM>> less than 770 1/10: Strep pneumo and Legionella urinary antigens>> negative 1/10: Tracheal aspirate>> Proteus mirabilis, Moraxella Catarrhalis (beta lactamase +) 1/11: Acute viral hepatitis panel>>  nonreactive 1/13: Blood culture x 2>> no growth 1/22: RVP>> negative  Antimicrobials:   Anti-infectives  (From admission, onward)    Start     Dose/Rate Route Frequency Ordered Stop   05/08/23 1400  piperacillin-tazobactam (ZOSYN) IVPB 3.375 g  Status:  Discontinued        3.375 g 12.5 mL/hr over 240 Minutes Intravenous Every 8 hours 05/08/23 1012 05/14/23 1116   05/08/23 1000  Ampicillin-Sulbactam (UNASYN) 3 g in sodium chloride 0.9 % 100 mL IVPB  Status:  Discontinued        3 g 200 mL/hr over 30 Minutes Intravenous Every 6 hours 05/08/23 0803 05/08/23 1011   05/05/23 0900  cefTRIAXone (ROCEPHIN) 2 g in sodium chloride 0.9 % 100 mL IVPB        2 g 200 mL/hr over 30 Minutes Intravenous  Once 05/04/23 1202 05/05/23 1312   05/01/23 1030  cefTRIAXone (ROCEPHIN) 2 g in sodium chloride 0.9 % 100 mL IVPB        2 g 200 mL/hr over 30 Minutes Intravenous Daily 05/01/23 0932 05/04/23 1913   05/01/23 1000  cefTRIAXone (ROCEPHIN) 1 g in sodium chloride 0.9 % 100 mL IVPB  Status:  Discontinued        1 g 200 mL/hr over 30 Minutes Intravenous Every 24 hours 05/01/23 0756 05/01/23 0932   04/27/23 1400  piperacillin-tazobactam (ZOSYN) IVPB 3.375 g  Status:  Discontinued        3.375 g 12.5 mL/hr over 240 Minutes Intravenous Every 8 hours 04/27/23 0744 05/01/23 0756   04/27/23 1000  doxycycline (VIBRAMYCIN) 100 mg in dextrose 5 % 250 mL IVPB        100 mg 125 mL/hr over 120 Minutes Intravenous Every 12 hours 04/26/23 1358 04/29/23 1900   04/26/23 2200  piperacillin-tazobactam (ZOSYN) IVPB 4.5 g  Status:  Discontinued        4.5 g 200 mL/hr over 30 Minutes Intravenous Every 8 hours 04/26/23 1921 04/27/23 0744   04/26/23 1000  azithromycin (ZITHROMAX) 500 mg in sodium chloride 0.9 % 250 mL IVPB  Status:  Discontinued        500 mg 250 mL/hr over 60 Minutes Intravenous Every 24 hours 04/26/23 0401 04/26/23 1357   04/26/23 0600  piperacillin-tazobactam (ZOSYN) IVPB 3.375 g  Status:  Discontinued        3.375 g 12.5 mL/hr over 240 Minutes Intravenous Every 8 hours 04/26/23 0407 04/26/23 1921   04/25/23  1915  cefTRIAXone (ROCEPHIN) 2 g in sodium chloride 0.9 % 100 mL IVPB        2 g 200 mL/hr over 30 Minutes Intravenous  Once 04/25/23 1914 04/25/23 2013       Significant Hospital Events: Including procedures, antibiotic start and stop dates in addition to other pertinent events   04/24/2022 - Intubated, admit to ICU extremely hypotensive.  04/28/2022 - Started on Librium taper and Seroquel BID.  04/29/2022 - Remains with significant agitation on SAT and unable to proceed with extubation.  04/30/2022 Mental status with significant improvement. Following commands. Hgb trending down now at 7.3g/dl. Stools brown. NG tube to suction and no signs of bleeding.  05/01/2022 1pRBC given. Improved mental status.  05/02/2022 Patient H&H stable. Did well on SBT however mentation remains extremley poor with significant thick secretions therefore held off on extubation.  05/03/2022 Remains with poor mental status, not doing well with SBT. My concern is that we are looking at a trach if no improvement in mental status in  the next 48hrs.  05/05/2023: Initially tolerated WUA and SBT, however with BM and turning, agitated and severe hypoxia. 05/06/2023: On minimal vent settings, FAILED SBT.  Increase Seroquel to help with agitation.  Decrease free water flushes to 200 q4h. 05/07/2023: On minimal vent settings, perform SBT as tolerated. Diurese with 60 mg IV Lasix x1 dose.  EXTUBATED. 05/08/2023: Pt failed extubation due to worsening acute respiratory failure secondary to excessive secretions requiring reintubation. Pt febrile with worsening leukocytosis, unasyn started for aspiration pneumonia  05/09/2023: Pt remains mechanically intubated FiO2 55%/PEEP 5.  Currently sedated with precedex and low dose fentanyl gtt.  Pt bradycardic will discontinue precedex gtt and restart propofol gtt 05/10/2023: On 50% FiO2.  Diurese with 40 mg IV Lasix x1 dose.  Triglycerides 702, d/c Propofol and change to Dilaudid, Precedex  with prn versed pushes.  Developed A.fib with RVR, converted back to NSR with Amiodarone bous. 05/11/2023: Increased WOB on WUA. 05/12/2023: On minimal vent settings, plan for WUA and SBT when family arrives.  Will diurese with 40 mg IV Lasix x1 dose.   05/13/23- for SBT today, opening eyes but not consistently following verbal communication.  Family conference today.  Patient re-evaluated during SBT with passing parameters and liberated from MV.  Extubated 05/13/23 - Met with family reviewed overall comorbid and poor long term prognosis.  05/14/23- patient had slp with diet ordered, behavior is better mentation imrproved  Interim History / Subjective:  As outlined above under significant events   Objective   Blood pressure (!) 127/59, pulse 87, temperature 99.9 F (37.7 C), temperature source Axillary, resp. rate 20, height 5' 7.01" (1.702 m), weight 71.1 kg, SpO2 99%.        Intake/Output Summary (Last 24 hours) at 05/16/2023 1039 Last data filed at 05/16/2023 0757 Gross per 24 hour  Intake 379.29 ml  Output 1585 ml  Net -1205.71 ml   Filed Weights   05/13/23 0430 05/14/23 0450 05/15/23 0421  Weight: 79.7 kg 78.7 kg 71.1 kg    Examination: General: NAD HENT: Atraumatic, normocephalic, neck supple, no JVD,  Lungs: Coarse throughout, even, non labored, Cardiovascular: Sinus rhythm s1s2, no m/r/g, 1+ radial/1+ distal pulses  Abdomen: +BS x4, soft, obese, slightly distended  Extremities: Normal bulk and tone, no deformities Neuro: no FND grossly GU: Indwelling foley catheter draining yellow urine   Resolved Hospital Problem list   Rhabdomyolysis  Thrombocytopenia   Assessment & Plan:   #Acute metabolic encephalopathy- RESOLVED  - Avoid sedating medications as able - Daily wake up assessment - Continue thiamine, MVI, folic acid due to alcoholism hx  #Septic shock ~ RESOLVED #Intermittent atrial fibrillation with rvr ~ CONVERTED TO NSR #Elevated Troponin, suspect demand  ischemia Echocardiogram 04/26/23: LVEF >55%, diastolic function unable to be evaluated, RV systolic function mildly reduced, RV size is normal -Continuous cardiac monitoring -Maintain MAP >65 -Vasopressors as needed to maintain MAP goal -Diuresis as BP and renal function permits - Prn metoprolol for sustained hr >120 bpm - Troponin peaked at 885  -TSH normal at 1.2 on 04/26/23  #Acute hypoxic respiratory failure -IMPROVED         Extubated 05/13/23  #Aspiration pneumonia (Trach aspirate with Moraxella Ctarrhalis and proteus Mirabilis, gram stain w/ Gram + Cocci)  #Aspiration pneumonia ((Trach aspirate with Moraxella Ctarrhalis and proteus Mirabilis, gram stain w/ Gram + Cocci ) ~ TREATED -Monitor fever curve -Trend WBC's & Procalcitonin -Follow cultures as above -Continue empiric Zosyn pending cultures & sensitivities   #Alcohol withdrawal syndrome- resolved  On librium taper . S/p IV ativan 1mg  today   #AKI~RESOLVED #Hypernatremia~RESVOLED  #Hypokalemia - Trend BMP  - Replace electrolytes as indicated  - Strict I&O's  - Continue free water flush to 200 ml q4hrs  - Replace electrolytes as indicated~pharmacy following for assistance with electrolyte replacement  #Anemia without obvious signs of bleeding  CT Abd/Pelvis 05/02/2023: without any sign of intraperitoneal or retroperitoneal hematoma - Trend CBC - VTE px: subcutaneous lovenox  - Monitor for s/sx of bleeding  - Transfuse for Hgb <8 - GI consulted, appreciate input: No indication for EGD currently, continue PPI BID for SUP, if further drop in Hgb recommends Tagged RBC scan  #Hyperglycemia  #Hypoglycemia~resolved   - CBG's q4hrs  - Sensitive SSI  - Follow hyper/hypoglycemic protocol    Best Practice (right click and "Reselect all SmartList Selections" daily)   Diet/type: tubefeeds DVT prophylaxis: LMWH GI prophylaxis: PPI Lines: Yes and still needed  Foley: Yes and still needed  Code Status:  DNR Last date  of multidisciplinary goals of care discussion [05/12/23]   Labs   CBC: Recent Labs  Lab 05/11/23 0334 05/13/23 0428 05/14/23 0445 05/15/23 0432 05/16/23 0427  WBC 6.8 9.3 10.1 12.2* 9.9  HGB 7.3* 7.3* 7.3* 8.3* 8.7*  HCT 23.9* 23.1* 23.1* 26.9* 27.7*  MCV 106.7* 102.2* 104.5* 104.7* 102.2*  PLT 210 223 170 311 316    Basic Metabolic Panel: Recent Labs  Lab 05/12/23 0757 05/13/23 0428 05/13/23 1413 05/14/23 0445 05/15/23 0432 05/15/23 2018 05/16/23 0427 05/16/23 0636  NA 143 144  --  144 147*  --  146*  --   K 3.3* 3.2* 4.1 3.5 3.1* 3.7 3.2*  --   CL 107 105  --  109 103  --  109  --   CO2 28 29  --  28 26  --  24  --   GLUCOSE 145* 134*  --  120* 89  --  102*  --   BUN 30* 30*  --  25* 19  --  13  --   CREATININE 1.01 1.02  --  0.78 0.89  --  0.84  --   CALCIUM 8.5* 8.5*  --  8.4* 8.7*  --  9.2  --   MG 1.6* 1.7 1.8 1.7 1.6* 2.2 2.0  --   PHOS 2.3* 2.8  --  2.4* 2.8  --   --  2.2*   GFR: Estimated Creatinine Clearance: 77.6 mL/min (by C-G formula based on SCr of 0.84 mg/dL). Recent Labs  Lab 05/13/23 0428 05/14/23 0445 05/15/23 0432 05/16/23 0427  WBC 9.3 10.1 12.2* 9.9    Liver Function Tests: Recent Labs  Lab 05/10/23 0418 05/11/23 0334  ALBUMIN 2.2* 2.2*   No results for input(s): "LIPASE", "AMYLASE" in the last 168 hours. No results for input(s): "AMMONIA" in the last 168 hours.  ABG    Component Value Date/Time   PHART 7.41 05/08/2023 0947   PCO2ART 39 05/08/2023 0947   PO2ART 94 05/08/2023 0947   HCO3 24.7 05/08/2023 0947   ACIDBASEDEF 0.6 05/08/2023 0430   O2SAT 99.6 05/08/2023 0947     Coagulation Profile: No results for input(s): "INR", "PROTIME" in the last 168 hours.  Cardiac Enzymes: No results for input(s): "CKTOTAL", "CKMB", "CKMBINDEX", "TROPONINI" in the last 168 hours.  HbA1C: Hgb A1c MFr Bld  Date/Time Value Ref Range Status  04/27/2023 04:24 AM 5.1 4.8 - 5.6 % Final    Comment:    (NOTE) Pre diabetes:  5.7%-6.4%  Diabetes:              >6.4%  Glycemic control for   <7.0% adults with diabetes   04/26/2023 03:25 AM 5.1 4.8 - 5.6 % Final    Comment:    (NOTE) Pre diabetes:          5.7%-6.4%  Diabetes:              >6.4%  Glycemic control for   <7.0% adults with diabetes     CBG: Recent Labs  Lab 05/15/23 2011 05/15/23 2318 05/16/23 0403 05/16/23 0439 05/16/23 0744  GLUCAP 76 111* 84 99 105*    Review of Systems:   Unable to assess due to AMS/Intubation/sedation    Past Medical History:  He,  has a past medical history of EtOH dependence (HCC), History of echocardiogram, and Incarcerated inguinal hernia.   Surgical History:   Past Surgical History:  Procedure Laterality Date   INGUINAL HERNIA REPAIR Right 11/01/2016   Procedure: HERNIA REPAIR INGUINAL INCARCERATED;  Surgeon: Leafy Ro, MD;  Location: [facility] ORS;  Service: General;  Laterality: Right;   NO PAST SURGERIES       Social History:   reports that he has been smoking cigarettes. His smokeless tobacco use includes chew. He reports current alcohol use of about 126.0 standard drinks of alcohol per week. He reports that he does not use drugs.   Family History:  His Family history is unknown by patient.   Allergies Allergies  Allergen Reactions   Robinul [Glycopyrrolate] Other (See Comments)    Arrhythmia's     Home Medications  Prior to Admission medications   Not on File     Critical care provider statement:   Total critical care time: 32 minutes   Performed by: Karna Christmas MD   Critical care time was exclusive of separately billable procedures and treating other patients.   Critical care was necessary to treat or prevent imminent or life-threatening deterioration.   Critical care was time spent personally by me on the following activities: development of treatment plan with patient and/or surrogate as well as nursing, discussions with consultants, evaluation of patient's response to  treatment, examination of patient, obtaining history from patient or surrogate, ordering and performing treatments and interventions, ordering and review of laboratory studies, ordering and review of radiographic studies, pulse oximetry and re-evaluation of patient's condition.    Vida Rigger, M.D.  Pulmonary & Critical Care Medicine

## 2023-05-17 DIAGNOSIS — G9341 Metabolic encephalopathy: Secondary | ICD-10-CM | POA: Diagnosis not present

## 2023-05-17 LAB — GLUCOSE, CAPILLARY
Glucose-Capillary: 114 mg/dL — ABNORMAL HIGH (ref 70–99)
Glucose-Capillary: 114 mg/dL — ABNORMAL HIGH (ref 70–99)
Glucose-Capillary: 101 mg/dL — ABNORMAL HIGH (ref 70–99)
Glucose-Capillary: 150 mg/dL — ABNORMAL HIGH (ref 70–99)

## 2023-05-17 LAB — CBC
HCT: 29.7 % — ABNORMAL LOW (ref 39.0–52.0)
Hemoglobin: 9.5 g/dL — ABNORMAL LOW (ref 13.0–17.0)
MCH: 32.4 pg (ref 26.0–34.0)
MCHC: 32 g/dL (ref 30.0–36.0)
MCV: 101.4 fL — ABNORMAL HIGH (ref 80.0–100.0)
Platelets: 349 10*3/uL (ref 150–400)
RBC: 2.93 MIL/uL — ABNORMAL LOW (ref 4.22–5.81)
RDW: 15.2 % (ref 11.5–15.5)
WBC: 11.2 10*3/uL — ABNORMAL HIGH (ref 4.0–10.5)
nRBC: 0 % (ref 0.0–0.2)

## 2023-05-17 LAB — BASIC METABOLIC PANEL
Anion gap: 12 (ref 5–15)
BUN: 11 mg/dL (ref 8–23)
CO2: 24 mmol/L (ref 22–32)
Calcium: 9 mg/dL (ref 8.9–10.3)
Chloride: 112 mmol/L — ABNORMAL HIGH (ref 98–111)
Creatinine, Ser: 0.8 mg/dL (ref 0.61–1.24)
GFR, Estimated: >60 mL/min (ref >60)
Glucose, Bld: 115 mg/dL — ABNORMAL HIGH (ref 70–99)
Potassium: 3.4 mmol/L — ABNORMAL LOW (ref 3.5–5.1)
Sodium: 148 mmol/L — ABNORMAL HIGH (ref 135–145)

## 2023-05-17 LAB — MAGNESIUM: Magnesium: 1.9 mg/dL (ref 1.7–2.4)

## 2023-05-17 LAB — PHOSPHORUS: Phosphorus: 3.3 mg/dL (ref 2.5–4.6)

## 2023-05-17 MED ORDER — POTASSIUM CHLORIDE CRYS ER 20 MEQ PO TBCR
40.0000 meq | EXTENDED_RELEASE_TABLET | Freq: Once | ORAL | Status: AC
  Administered 2023-05-17: 40 meq via ORAL
  Filled 2023-05-17: qty 2

## 2023-05-17 MED ORDER — SODIUM CHLORIDE 0.45 % IV SOLN: INTRAVENOUS | Status: AC

## 2023-05-17 MED ORDER — HALOPERIDOL LACTATE 5 MG/ML IJ SOLN
2.0000 mg | Freq: Four times a day (QID) | INTRAMUSCULAR | Status: DC | PRN
  Administered 2023-05-17 – 2023-05-20 (×4): 2 mg via INTRAVENOUS
  Filled 2023-05-17 (×4): qty 1

## 2023-05-17 MED ORDER — LORAZEPAM 2 MG/ML IJ SOLN
1.0000 mg | Freq: Once | INTRAMUSCULAR | Status: AC
  Administered 2023-05-17: 1 mg via INTRAVENOUS
  Filled 2023-05-17: qty 1

## 2023-05-17 NOTE — Plan of Care (Signed)
  Problem: Education: Goal: Knowledge of General Education information will improve Description: Including pain rating scale, medication(s)/side effects and non-pharmacologic comfort measures Outcome: Progressing   Problem: Health Behavior/Discharge Planning: Goal: Ability to manage health-related needs will improve Outcome: Progressing   Problem: Clinical Measurements: Goal: Ability to maintain clinical measurements within normal limits will improve Outcome: Progressing Goal: Will remain free from infection Outcome: Progressing Goal: Diagnostic test results will improve Outcome: Progressing Goal: Respiratory complications will improve Outcome: Progressing Goal: Cardiovascular complication will be avoided Outcome: Progressing   Problem: Activity: Goal: Risk for activity intolerance will decrease Outcome: Progressing   Problem: Nutrition: Goal: Adequate nutrition will be maintained Outcome: Progressing   Problem: Coping: Goal: Level of anxiety will decrease Outcome: Progressing   Problem: Elimination: Goal: Will not experience complications related to bowel motility Outcome: Progressing Goal: Will not experience complications related to urinary retention Outcome: Progressing   Problem: Pain Management: Goal: General experience of comfort will improve Outcome: Progressing   Problem: Safety: Goal: Ability to remain free from injury will improve Outcome: Progressing   Problem: Skin Integrity: Goal: Risk for impaired skin integrity will decrease Outcome: Progressing   Problem: Education: Goal: Ability to describe self-care measures that may prevent or decrease complications (Diabetes Survival Skills Education) will improve Outcome: Progressing   Problem: Coping: Goal: Ability to adjust to condition or change in health will improve Outcome: Progressing   Problem: Fluid Volume: Goal: Ability to maintain a balanced intake and output will improve Outcome:  Progressing   Problem: Health Behavior/Discharge Planning: Goal: Ability to identify and utilize available resources and services will improve Outcome: Progressing Goal: Ability to manage health-related needs will improve Outcome: Progressing   Problem: Metabolic: Goal: Ability to maintain appropriate glucose levels will improve Outcome: Progressing   Problem: Nutritional: Goal: Maintenance of adequate nutrition will improve Outcome: Progressing Goal: Progress toward achieving an optimal weight will improve Outcome: Progressing   Problem: Skin Integrity: Goal: Risk for impaired skin integrity will decrease Outcome: Progressing   Problem: Tissue Perfusion: Goal: Adequacy of tissue perfusion will improve Outcome: Progressing

## 2023-05-17 NOTE — Progress Notes (Signed)
PHARMACY CONSULT NOTE  Pharmacy Consult for Electrolyte Monitoring and Replacement   Recent Labs: Potassium (mmol/L)  Date Value  05/17/2023 3.4 (L)  08/23/2013 3.3 (L)   Magnesium (mg/dL)  Date Value  16/01/9603 1.9  08/23/2013 1.4 (L)   Calcium (mg/dL)  Date Value  54/12/8117 9.0   Calcium, Total (mg/dL)  Date Value  14/78/2956 9.5   Albumin (g/dL)  Date Value  21/30/8657 2.2 (L)   Phosphorus (mg/dL)  Date Value  84/69/6295 3.3  08/23/2013 2.2 (L)   Sodium (mmol/L)  Date Value  05/17/2023 148 (H)  08/23/2013 128 (L)   Assessment: 68 y/o male with h/o SBO secondary to strangulated inguinal hernia s/p repair 2018, etoh abuse and seizures who is admitted with AMS, aspiration pneumonia, sepsis, AKI and rhabdomyolysis. Pharmacy is asked to follow and replace electrolytes while in CCU  Goal of Therapy:  Electrolytes WNL  Plan:  --40 mEq po KCl x 1 --0.45 % sodium chloride infusion at 75 mL/hr ISO hypernatremia --recheck electrolytes again with AM labs tomorrow  Lowella Bandy 05/17/2023 12:47 PM

## 2023-05-17 NOTE — Progress Notes (Signed)
Physical Therapy Treatment Patient Details Name: Curtis [name] MRN: [id number] DOB: May 26, 1955 Today's Date: 05/17/2023   History of Present Illness Patient is a 68 year old male found unresponsive, hypothermic, acute hypoxic respiratory failure with intubation required, extubated 05/07/23, re- intubated, and extubated again 05/13/23. History of inguinal hernia s/p repair, SBO, alcohol withdrawal seizure, EtOH abuse.    PT Comments  Patient is confused but cooperative. He continues to require assistance with bed mobility. He is able to stand x 2 bouts with Max A. Very limited standing tolerance with significant assistance required to maintain standing balance. Activity tolerance limited by fatigue and generalized weakness. Recommend to continue PT to maximize independence and facilitate return to prior level of function. Rehabilitation < 3 hours/day recommended after this hospital stay.    If plan is discharge home, recommend the following: Two people to help with walking and/or transfers;Two people to help with bathing/dressing/bathroom;Assistance with cooking/housework;Help with stairs or ramp for entrance;Supervision due to cognitive status   Can travel by private vehicle     No  Equipment Recommendations       Recommendations for Other Services       Precautions / Restrictions Precautions Precautions: Fall Restrictions Weight Bearing Restrictions Per Provider Order: No     Mobility  Bed Mobility Overal bed mobility: Needs Assistance Bed Mobility: Supine to Sit, Sit to Supine     Supine to sit: Max assist Sit to supine: Max assist   General bed mobility comments: assistance for trunk and BLE support. verbal cues for technique. +2 required for pulling patient up in bed using the draw pad    Transfers Overall transfer level: Needs assistance Equipment used: 1 person hand held assist Transfers: Sit to/from Stand Sit to Stand: Max assist           General transfer  comment: lifting and lowering assistance provided for standing x 2 bouts. cues for anterior weight shifting and techniques to facilitate independence    Ambulation/Gait               General Gait Details: unable to safely attempt at this time due to generalized weakness   Stairs             Wheelchair Mobility     Tilt Bed    Modified Rankin (Stroke Patients Only)       Balance Overall balance assessment: Needs assistance Sitting-balance support: Feet supported Sitting balance-Leahy Scale: Poor Sitting balance - Comments: intermittent trunk swaying (anterior/posterior rocking). Min A provided for safety Postural control: Posterior lean Standing balance support: No upper extremity supported Standing balance-Leahy Scale: Zero Standing balance comment: significant external support required to maintain standing balance                            Cognition Arousal: Alert Behavior During Therapy: Anxious Overall Cognitive Status: Impaired/Different from baseline Area of Impairment: Following commands, Orientation, Memory, Safety/judgement, Problem solving                 Orientation Level: Disoriented to, Place, Time, Situation   Memory: Decreased short-term memory Following Commands: Follows one step commands with increased time Safety/Judgement: Decreased awareness of safety, Decreased awareness of deficits   Problem Solving: Slow processing, Decreased initiation, Difficulty sequencing, Requires verbal cues, Requires tactile cues          Exercises      General Comments        Pertinent Vitals/Pain Pain Assessment Pain  Assessment: No/denies pain    Home Living                          Prior Function            PT Goals (current goals can now be found in the care plan section) Acute Rehab PT Goals Patient Stated Goal: to go home PT Goal Formulation: With patient Time For Goal Achievement: 05/30/23 Potential to  Achieve Goals: Good Progress towards PT goals: Progressing toward goals    Frequency    Min 1X/week      PT Plan      Co-evaluation              AM-PAC PT "6 Clicks" Mobility   Outcome Measure  Help needed turning from your back to your side while in a flat bed without using bedrails?: A Lot Help needed moving from lying on your back to sitting on the side of a flat bed without using bedrails?: A Lot Help needed moving to and from a bed to a chair (including a wheelchair)?: Total Help needed standing up from a chair using your arms (e.g., wheelchair or bedside chair)?: Total Help needed to walk in hospital room?: Total Help needed climbing 3-5 steps with a railing? : Total 6 Click Score: 8    End of Session   Activity Tolerance: Patient limited by fatigue;Patient tolerated treatment well Patient left: in bed;with call bell/phone within reach;with nursing/sitter in room (1:1 sitter in the room) Nurse Communication: Mobility status PT Visit Diagnosis: Muscle weakness (generalized) (M62.81);Unsteadiness on feet (R26.81)     Time: 3295-1884 PT Time Calculation (min) (ACUTE ONLY): 11 min  Charges:    $Therapeutic Activity: 8-22 mins PT General Charges $$ ACUTE PT VISIT: 1 Visit                     Donna Bernard, PT, MPT    Ina Homes 05/17/2023, 12:55 PM

## 2023-05-17 NOTE — Plan of Care (Signed)
Problem: Education: Goal: Knowledge of General Education information will improve Description: Including pain rating scale, medication(s)/side effects and non-pharmacologic comfort measures 05/17/2023 2306 by Dorma Russell, RN Outcome: Progressing 05/17/2023 2144 by Dorma Russell, RN Outcome: Progressing   Problem: Health Behavior/Discharge Planning: Goal: Ability to manage health-related needs will improve 05/17/2023 2306 by Dorma Russell, RN Outcome: Progressing 05/17/2023 2144 by Dorma Russell, RN Outcome: Progressing   Problem: Clinical Measurements: Goal: Ability to maintain clinical measurements within normal limits will improve 05/17/2023 2306 by Dorma Russell, RN Outcome: Progressing 05/17/2023 2144 by Dorma Russell, RN Outcome: Progressing Goal: Will remain free from infection 05/17/2023 2306 by Dorma Russell, RN Outcome: Progressing 05/17/2023 2144 by Dorma Russell, RN Outcome: Progressing Goal: Diagnostic test results will improve 05/17/2023 2306 by Dorma Russell, RN Outcome: Progressing 05/17/2023 2144 by Dorma Russell, RN Outcome: Progressing Goal: Respiratory complications will improve 05/17/2023 2306 by Dorma Russell, RN Outcome: Progressing 05/17/2023 2144 by Dorma Russell, RN Outcome: Progressing Goal: Cardiovascular complication will be avoided 05/17/2023 2306 by Dorma Russell, RN Outcome: Progressing 05/17/2023 2144 by Dorma Russell, RN Outcome: Progressing   Problem: Activity: Goal: Risk for activity intolerance will decrease 05/17/2023 2306 by Dorma Russell, RN Outcome: Progressing 05/17/2023 2144 by Dorma Russell, RN Outcome: Progressing   Problem: Nutrition: Goal: Adequate nutrition will be maintained 05/17/2023 2306 by Dorma Russell, RN Outcome: Progressing 05/17/2023 2144 by Dorma Russell, RN Outcome: Progressing   Problem: Coping: Goal: Level of anxiety will decrease 05/17/2023 2306 by Dorma Russell, RN Outcome:  Progressing 05/17/2023 2144 by Dorma Russell, RN Outcome: Progressing   Problem: Elimination: Goal: Will not experience complications related to bowel motility 05/17/2023 2306 by Dorma Russell, RN Outcome: Progressing 05/17/2023 2144 by Dorma Russell, RN Outcome: Progressing Goal: Will not experience complications related to urinary retention 05/17/2023 2306 by Dorma Russell, RN Outcome: Progressing 05/17/2023 2144 by Dorma Russell, RN Outcome: Progressing   Problem: Pain Management: Goal: General experience of comfort will improve 05/17/2023 2306 by Dorma Russell, RN Outcome: Progressing 05/17/2023 2144 by Dorma Russell, RN Outcome: Progressing   Problem: Safety: Goal: Ability to remain free from injury will improve 05/17/2023 2306 by Dorma Russell, RN Outcome: Progressing 05/17/2023 2144 by Dorma Russell, RN Outcome: Progressing   Problem: Skin Integrity: Goal: Risk for impaired skin integrity will decrease 05/17/2023 2306 by Dorma Russell, RN Outcome: Progressing 05/17/2023 2144 by Dorma Russell, RN Outcome: Progressing   Problem: Education: Goal: Ability to describe self-care measures that may prevent or decrease complications (Diabetes Survival Skills Education) will improve 05/17/2023 2306 by Dorma Russell, RN Outcome: Progressing 05/17/2023 2144 by Dorma Russell, RN Outcome: Progressing   Problem: Coping: Goal: Ability to adjust to condition or change in health will improve 05/17/2023 2306 by Dorma Russell, RN Outcome: Progressing 05/17/2023 2144 by Dorma Russell, RN Outcome: Progressing   Problem: Fluid Volume: Goal: Ability to maintain a balanced intake and output will improve 05/17/2023 2306 by Dorma Russell, RN Outcome: Progressing 05/17/2023 2144 by Dorma Russell, RN Outcome: Progressing   Problem: Health Behavior/Discharge Planning: Goal: Ability to identify and utilize available resources and services will improve 05/17/2023  2306 by Dorma Russell, RN Outcome: Progressing 05/17/2023 2144 by Dorma Russell, RN Outcome: Progressing Goal: Ability to manage health-related needs will improve 05/17/2023 2306 by Dorma Russell, RN Outcome: Progressing 05/17/2023 2144  by Dorma Russell, RN Outcome: Progressing   Problem: Metabolic: Goal: Ability to maintain appropriate glucose levels will improve 05/17/2023 2306 by Dorma Russell, RN Outcome: Progressing 05/17/2023 2144 by Dorma Russell, RN Outcome: Progressing   Problem: Nutritional: Goal: Maintenance of adequate nutrition will improve 05/17/2023 2306 by Dorma Russell, RN Outcome: Progressing 05/17/2023 2144 by Dorma Russell, RN Outcome: Progressing Goal: Progress toward achieving an optimal weight will improve 05/17/2023 2306 by Dorma Russell, RN Outcome: Progressing 05/17/2023 2144 by Dorma Russell, RN Outcome: Progressing   Problem: Skin Integrity: Goal: Risk for impaired skin integrity will decrease 05/17/2023 2306 by Dorma Russell, RN Outcome: Progressing 05/17/2023 2144 by Dorma Russell, RN Outcome: Progressing   Problem: Tissue Perfusion: Goal: Adequacy of tissue perfusion will improve 05/17/2023 2306 by Dorma Russell, RN Outcome: Progressing 05/17/2023 2144 by Dorma Russell, RN Outcome: Progressing

## 2023-05-17 NOTE — Progress Notes (Addendum)
Daily Progress Note   Patient Name: Curtis [name]       Date: 05/17/2023 DOB: 1955-04-19  Age: 68 y.o. MRN#: [id number] Attending Physician: Vida Rigger, MD Primary Care Physician: Patient, No Pcp Per Admit Date: 04/25/2023  Reason for Consultation/Follow-up: Establishing goals of care  Subjective: Notes and labs reviewed.  In to see patient.  He is currently resting in bed, confused.  Spoke with attending to update.  Current plans in place for DNR/DNI.  PMT will sign off at this time.  Please reconsult if needs arise.  Length of Stay: 22  Current Medications: Scheduled Meds:   apixaban  5 mg Oral BID   vitamin C  500 mg Oral BID   benztropine  1 mg Oral BID   chlordiazePOXIDE  25 mg Oral TID   Followed by   chlordiazePOXIDE  25 mg Oral BH-qamhs   Followed by   Melene Muller ON 05/18/2023] chlordiazePOXIDE  25 mg Oral Daily   Chlorhexidine Gluconate Cloth  6 each Topical Nightly   feeding supplement (NEPRO CARB STEADY)  237 mL Oral TID BM   folic acid  1 mg Oral Daily   insulin aspart  0-9 Units Subcutaneous Q4H   ipratropium-albuterol  3 mL Nebulization BID   metoprolol tartrate  25 mg Oral BID   multivitamin with minerals  1 tablet Oral Daily   nicotine  21 mg Transdermal Daily   mouth rinse  15 mL Mouth Rinse 4 times per day   pantoprazole (PROTONIX) IV  40 mg Intravenous Q12H   potassium chloride  40 mEq Oral Once   [START ON 05/18/2023] predniSONE  45 mg Oral Q breakfast   Followed by   Melene Muller ON 05/19/2023] predniSONE  40 mg Oral Q breakfast   Followed by   Melene Muller ON 05/20/2023] predniSONE  35 mg Oral Q breakfast   Followed by   Melene Muller ON 05/21/2023] predniSONE  30 mg Oral Q breakfast   Followed by   Melene Muller ON 05/22/2023] predniSONE  25 mg Oral Q breakfast   Followed by   Melene Muller ON  05/23/2023] predniSONE  20 mg Oral Q breakfast   Followed by   Melene Muller ON 05/24/2023] predniSONE  15 mg Oral Q breakfast   Followed by   Melene Muller ON 05/25/2023] predniSONE  10 mg Oral Q breakfast  Followed by   Melene Muller ON 05/26/2023] predniSONE  5 mg Oral Q breakfast   QUEtiapine  50 mg Oral QHS   sodium chloride flush  10-40 mL Intracatheter Q12H   thiamine  100 mg Oral Daily    Continuous Infusions:  sodium chloride      PRN Meds: acetaminophen, acetaminophen, guaiFENesin, haloperidol lactate, hydrALAZINE, labetalol, metoprolol tartrate, ondansetron (ZOFRAN) IV, mouth rinse, polyethylene glycol, sodium chloride flush  Physical Exam Pulmonary:     Effort: Pulmonary effort is normal.  Neurological:     Mental Status: He is alert.             Vital Signs: BP (!) 125/58   Pulse 92   Temp 98.6 F (37 C) (Oral)   Resp (!) 22   Ht 5' 7.01" (1.702 m)   Wt 67.1 kg   SpO2 97%   BMI 23.16 kg/m  SpO2: SpO2: 97 % O2 Device: O2 Device: Nasal Cannula O2 Flow Rate: O2 Flow Rate (L/min): 2 L/min  Intake/output summary:  Intake/Output Summary (Last 24 hours) at 05/17/2023 1225 Last data filed at 05/17/2023 0500 Gross per 24 hour  Intake 95.41 ml  Output 1300 ml  Net -1204.59 ml   LBM: Last BM Date : 05/17/23 Baseline Weight: Weight: 79 kg Most recent weight: Weight: 67.1 kg    Patient Active Problem List   Diagnosis Date Noted   Demand ischemia (HCC) 04/27/2023   Shock circulatory (HCC) 04/26/2023   Sepsis with encephalopathy (HCC) 04/26/2023   Hypothermia 04/26/2023   Non-traumatic rhabdomyolysis 04/26/2023   AKI (acute kidney injury) (HCC) 04/26/2023   NSTEMI (non-ST elevated myocardial infarction) (HCC) 04/26/2023   Urinary tract infection, acute 04/26/2023   Acute metabolic encephalopathy 04/25/2023   Incarcerated hernia 11/02/2016   Malnutrition of moderate degree 11/02/2016   Incarcerated inguinal hernia    SBO (small bowel obstruction) (HCC)    Incarcerated right  inguinal hernia    Seizures (HCC) 06/26/2016   Non-recurrent unilateral inguinal hernia without obstruction or gangrene    Alcohol withdrawal (HCC) 06/21/2016    Palliative Care Assessment & Plan    Recommendations/Plan: Goals set for DNR/DNI. Please reconsult if needs arise  Code Status:    Code Status Orders  (From admission, onward)      form?  Remove this document and all Prior MOST documents. (Example: Out of date treatment wishes or Healthcare decision maker incorrect)  Document does not belong to patient  Document is not a(n) MOST  There is critical data missing or incorrect information on this document (Example: missing signature, incorrect signed date, etc)  Document is a duplicate  Something else Please include your email address below, for updates on your issue. (optional)  Your Email Address Submit Back  [place] DOB: Sep 12, 1955 (Male, 68 y/o) VOID/REMOVE formDownloadPrint [name] DOB: [birth year] (Male, 68 y/o) [place], 67 y/o)Data from:cone       Start     Ordered   05/14/23 1855  Do not attempt resuscitation (DNR)- Limited -Do Not Intubate (DNI)  (Code Status)  Continuous       Question Answer Comment  If pulseless and not breathing No CPR or chest compressions.   In Pre-Arrest Conditions (Patient Is Breathing and Has A Pulse) Do not intubate. Provide all appropriate non-invasive medical interventions. Avoid ICU transfer unless indicated or required.   Consent: Discussion documented in EHR or advanced directives reviewed      05/14/23 1854  Code Status History     Date Active Date Inactive Code Status Order ID Comments User Context   05/09/2023 1337 05/14/2023 1854 Do not attempt resuscitation (DNR) PRE-ARREST INTERVENTIONS DESIRED [id number]  Morton Stall, NP Inpatient   04/25/2023 2011 05/09/2023 1337 Full Code [id number]  Jimmye Norman, NP ED   05/15/2017 0931 05/16/2017 0413 Full Code [id number]   Myrna Blazer, MD ED   11/02/2016 0132 11/03/2016 1823 Full Code [id number]  Leafy Ro, MD Inpatient   11/01/2016 2316 11/02/2016 0132 Full Code [id number]  Myrna Blazer, MD ED   06/26/2016 2324 06/28/2016 1556 Full Code [id number]  Oralia Manis, MD Inpatient   06/21/2016 1511 06/25/2016 1929 Full Code [id number]  Enedina Finner, MD Inpatient    Care plan was discussed with CCM and hospitalist Lucianne Muss.   Thank you for allowing the Palliative Medicine Team to assist in the care of this patient.     Morton Stall, NP  Please contact Palliative Medicine Team phone at [phone] for questions and concerns.

## 2023-05-17 NOTE — Progress Notes (Signed)
[facility] Progress Note  Patient: Curtis [name]    WUJ:[id number]  DOA: 04/25/2023     Date of Service: the patient was seen and examined on 05/17/2023  Chief Complaint  Patient presents with   unresponsive   Brief hospital course: 68 year old male patient with a past medical history of incarcerated inguinal hernia s/p repair, SBO, alcohol withdrawal seizures, EtOH abuse who presented to [facility] on 04/24/2022 for unresponsiveness. He was found to be profoundly hypothermic with temperature measuring 77 F. Subsequently was intubated on 04/24/2022. He was actively rewarmed and started on stress dose steroids. Currently normothermic. Course also complicated by rhabdomyolysis with CK peaking at 6000 and currently downtrending. Finally course complicated by acute hypoxic respiratory failure suspecting aspiration for which she is on Zosyn and doxycycline. Tracheal aspirate w/ Moraxella Cat Patient obstacle to extubation is mental status remains agitated with SAT.   Per ED report, EMS was dispatched to patient's residence by a neighbor who found the patient unconscious.  On EMS arrival, patient was found unresponsive on floor of bedroom. Neighbor report LKNW was 3 days ago. Patient was initially combative and would tense resist any treatment. EMS report they were unable to obtain a temperature or spo2 due to hypothermia and unable to obtain bp due to combative resistance. patient initial cbg registered at 24 treated with 1mg  glucagon repeat reading were in the upper 20's   ED Course: Initial vital signs showed HR of 55 beats/minute, BP 99/47 mm Hg, the RR 22 breaths/minute, and the oxygen saturation 100% on NRB and a temperature of 5F (25C). Patient intubated in for airway protection. Pertinent Labs/Diagnostics Findings: Na+/ K+:131/4.8  Glucose: 286 BUN/Cr.: 31/1.91 CO2 1, Anion Gap 23, AST/ALT:284/83 WBC: 15.7K/L with neutrophil predominance  Lactic acid: >9.0 COVID PCR: Negative,  troponin: 32   CK 5249 ETOH:177 VBG: pO2 46.2; pCO2 72; pH 6.96;  HCO3 16.2, %O2 Sat 46.6.  CXR> CTH> CTA Chest> CT Abd/pelvis>see results below Medication administered in the NF:AOZHYQM given 30 cc/kg of fluids and started on broad-spectrum antibiotics with Ceftriaxone for suspected sepsis with septic shock.    05/15/23- patient on diet without aspiration events.  Nonfebrile. Had episode of Afrvr overnight, now rate controlled on amio.  He does have few electrolyte abnormalities with repletion ongoing. He seems to have development of alcohol withdrawal.  Rectal tube active.  Weaned to 6L/min [place].  Starting beta blockade for af.   05/16/23- patient with signs of Tardive dykinesia, have discussed with phamacist regarding therapy.  He is less anxious/aggitated with librium taper for EtOH withdrawal. AF is rate controlled.  Switching to po prednisone and eliquis today. 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   Micro Data:  1/9: SARS-CoV-2/RSV/influenza PCR>> negative 1/9: HIV screen>> nonreactive 1/9: Blood culture x 2>> no growth 1/9: MRSA PCR>> negative 1/10: Respiratory viral panel>> negative 1/10: Mycoplasma pneumoniae IgM>> less than 770 1/10: Strep pneumo and Legionella urinary antigens>> negative 1/10: Tracheal aspirate>> Proteus mirabilis, Moraxella Catarrhalis (beta lactamase +) 1/11: Acute viral hepatitis panel>> nonreactive 1/13: Blood culture x 2>> no growth 1/22: RVP>> negative  Significant Hospital Events:  04/24/2022 - Intubated, admit to ICU extremely hypotensive.  04/28/2022 - Started on Librium taper and Seroquel BID.  04/29/2022 - Remains with significant agitation on SAT and unable to proceed with extubation.  04/30/2022 Mental status with significant improvement. Following commands. Hgb trending down now at 7.3g/dl. Stools brown. NG tube to suction and no signs  of bleeding.  05/01/2022 1pRBC given. Improved mental  status.  05/02/2022 Patient H&H stable. Did well on SBT however mentation remains extremley poor with significant thick secretions therefore held off on extubation.  05/03/2022 Remains with poor mental status, not doing well with SBT. My concern is that we are looking at a trach if no improvement in mental status in the next 48hrs.  05/05/2023: Initially tolerated WUA and SBT, however with BM and turning, agitated and severe hypoxia. 05/06/2023: On minimal vent settings, FAILED SBT.  Increase Seroquel to help with agitation.  Decrease free water flushes to 200 q4h. 05/07/2023: On minimal vent settings, perform SBT as tolerated. Diurese with 60 mg IV Lasix x1 dose.  EXTUBATED. 05/08/2023: Pt failed extubation due to worsening acute respiratory failure secondary to excessive secretions requiring reintubation. Pt febrile with worsening leukocytosis, unasyn started for aspiration pneumonia  05/09/2023: Pt remains mechanically intubated FiO2 55%/PEEP 5.  Currently sedated with precedex and low dose fentanyl gtt.  Pt bradycardic will discontinue precedex gtt and restart propofol gtt 05/10/2023: On 50% FiO2.  Diurese with 40 mg IV Lasix x1 dose.  Triglycerides 702, d/c Propofol and change to Dilaudid, Precedex with prn versed pushes.  Developed A.fib with RVR, converted back to NSR with Amiodarone bous. 05/11/2023: Increased WOB on WUA. 05/12/2023: On minimal vent settings, plan for WUA and SBT when family arrives.  Will diurese with 40 mg IV Lasix x1 dose.   05/13/23- for SBT today, opening eyes but not consistently following verbal communication.  Family conference today.  Patient re-evaluated during SBT with passing parameters and liberated from MV.  Extubated 05/13/23 - Met with family reviewed overall comorbid and poor long term prognosis.  05/14/23- patient had slp with diet ordered, behavior is better mentation imrproved  Assessment and Plan: #Acute metabolic encephalopathy- RESOLVED  - Avoid sedating  medications as able - Daily wake up assessment - Continue thiamine, MVI, folic acid due to alcoholism hx   #Septic shock ~ RESOLVED #Intermittent atrial fibrillation with rvr ~ CONVERTED TO NSR #Elevated Troponin, suspect demand ischemia Echocardiogram 04/26/23: LVEF >55%, diastolic function unable to be evaluated, RV systolic function mildly reduced, RV size is normal -Continuous cardiac monitoring -Maintain MAP >65 -Vasopressors as needed to maintain MAP goal -Diuresis as BP and renal function permits - Prn metoprolol for sustained hr >120 bpm - Troponin peaked at 885  -TSH normal at 1.2 on 04/26/23   #Acute hypoxic respiratory failure -IMPROVED         Extubated 05/13/23   #Aspiration pneumonia (Trach aspirate with Moraxella Ctarrhalis and proteus Mirabilis, gram stain w/ Gram + Cocci)   #Aspiration pneumonia ((Trach aspirate with Moraxella Ctarrhalis and proteus Mirabilis, gram stain w/ Gram + Cocci ) ~ TREATED -Monitor fever curve -Trend WBC's & Procalcitonin -Follow cultures as above -Continue empiric Zosyn pending cultures & sensitivities     #Alcohol withdrawal syndrome- resolved  On librium taper . S/p IV ativan 1mg  today    #AKI~RESOLVED #Hypernatremia~RESVOLED  #Hypokalemia - Trend BMP  - Replace electrolytes as indicated  - Strict I&O's  - Continue free water flush to 200 ml q4hrs  - Replace electrolytes as indicated~pharmacy following for assistance with electrolyte replacement   #Anemia without obvious signs of bleeding  CT Abd/Pelvis 05/02/2023: without any sign of intraperitoneal or retroperitoneal hematoma - Trend CBC - VTE px: subcutaneous lovenox  - Monitor for s/sx of bleeding  - Transfuse for Hgb <8 - GI consulted, appreciate input: No indication for EGD currently, continue PPI  BID for SUP, if further drop in Hgb recommends Tagged RBC scan   #Hyperglycemia  #Hypoglycemia~resolved   - CBG's q4hrs  - Sensitive SSI  - Follow hyper/hypoglycemic  protocol    Body mass index is 23.16 kg/m.  Nutrition Problem: Inadequate oral intake Etiology: inability to eat (pt sedated and ventilated) Interventions: Interventions: Tube feeding  Pressure Injury 05/07/23 Buttocks Right Stage 2 -  Partial thickness loss of dermis presenting as a shallow open injury with a red, pink wound bed without slough. (Active)  05/07/23 1139  Location: Buttocks  Location Orientation: Right  Staging: Stage 2 -  Partial thickness loss of dermis presenting as a shallow open injury with a red, pink wound bed without slough.  Wound Description (Comments):   Present on Admission:   Dressing Type Foam - Lift dressing to assess site every shift 05/17/23 0800     Pressure Injury 05/07/23 Anus Mid Stage 2 -  Partial thickness loss of dermis presenting as a shallow open injury with a red, pink wound bed without slough. (Active)  05/07/23 1810  Location: Anus  Location Orientation: Mid  Staging: Stage 2 -  Partial thickness loss of dermis presenting as a shallow open injury with a red, pink wound bed without slough.  Wound Description (Comments):   Present on Admission:   Dressing Type Foam - Lift dressing to assess site every shift 05/16/23 1930     Diet: Dysphagia 1 diet DVT Prophylaxis: Eliquis  Advance goals of care discussion: DNR/DNI-limited  Family Communication: family was not present at bedside, at the time of interview.  The pt provided permission to discuss medical plan with the family. Opportunity was given to ask question and all questions were answered satisfactorily.   Disposition:  Pt is from Home, admitted with EtOH withdrawal, respiratory failure, pneumonia, was admitted in the ICU, intubated.  Stabilized and downgraded under hospital service.  Patient still has altered mental status. which precludes a safe discharge. Discharge to SNF, when stable, may need few more days to improve.  Subjective: No significant events overnight, patient  remained confused, AO x 1, unable to offer any complaints. Due to confusion patient was on one-to-one observation   Physical Exam: General: NAD, lying comfortably Appear in no distress, affect appropriate Eyes: PERRLA ENT: Oral Mucosa Clear, moist  Neck: no JVD,  Cardiovascular: S1 and S2 Present, no Murmur,  Respiratory: good respiratory effort, Bilateral Air entry equal and Decreased, no Crackles, no wheezes Abdomen: Bowel Sound present, Soft and no tenderness,  Skin: no rashes Extremities: no Pedal edema, no calf tenderness Neurologic: without any new focal findings Gait not checked due to patient safety concerns  Vitals:   05/17/23 0900 05/17/23 1000 05/17/23 1100 05/17/23 1200  BP: 113/64 (!) 115/95 (!) 137/55 (!) 125/58  Pulse:  (!) 102 87 92  Resp: (!) 21 18 (!) 21 (!) 22  Temp:    98.6 F (37 C)  TempSrc:    Oral  SpO2:  94% 100% 97%  Weight:      Height:        Intake/Output Summary (Last 24 hours) at 05/17/2023 1448 Last data filed at 05/17/2023 1200 Gross per 24 hour  Intake 132.91 ml  Output 1300 ml  Net -1167.09 ml   Filed Weights   05/14/23 0450 05/15/23 0421 05/17/23 0500  Weight: 78.7 kg 71.1 kg 67.1 kg    Data Reviewed: I have personally reviewed and interpreted daily labs, tele strips, imagings as discussed above. I reviewed all  nursing notes, pharmacy notes, vitals, pertinent old records I have discussed plan of care as described above with RN and patient/family.  CBC: Recent Labs  Lab 05/13/23 0428 05/14/23 0445 05/15/23 0432 05/16/23 0427 05/17/23 0500  WBC 9.3 10.1 12.2* 9.9 11.2*  HGB 7.3* 7.3* 8.3* 8.7* 9.5*  HCT 23.1* 23.1* 26.9* 27.7* 29.7*  MCV 102.2* 104.5* 104.7* 102.2* 101.4*  PLT 223 170 311 316 349   Basic Metabolic Panel: Recent Labs  Lab 05/13/23 0428 05/13/23 1413 05/14/23 0445 05/15/23 0432 05/15/23 2018 05/16/23 0427 05/16/23 0636 05/17/23 0500  NA 144  --  144 147*  --  146*  --  148*  K 3.2*   < > 3.5 3.1*  3.7 3.2*  --  3.4*  CL 105  --  109 103  --  109  --  112*  CO2 29  --  28 26  --  24  --  24  GLUCOSE 134*  --  120* 89  --  102*  --  115*  BUN 30*  --  25* 19  --  13  --  11  CREATININE 1.02  --  0.78 0.89  --  0.84  --  0.80  CALCIUM 8.5*  --  8.4* 8.7*  --  9.2  --  9.0  MG 1.7   < > 1.7 1.6* 2.2 2.0  --  1.9  PHOS 2.8  --  2.4* 2.8  --   --  2.2* 3.3   < > = values in this interval not displayed.    Studies: No results found.  Scheduled Meds:  apixaban  5 mg Oral BID   vitamin C  500 mg Oral BID   benztropine  1 mg Oral BID   chlordiazePOXIDE  25 mg Oral TID   Followed by   chlordiazePOXIDE  25 mg Oral BH-qamhs   Followed by   Melene Muller ON 05/18/2023] chlordiazePOXIDE  25 mg Oral Daily   Chlorhexidine Gluconate Cloth  6 each Topical Nightly   feeding supplement (NEPRO CARB STEADY)  237 mL Oral TID BM   folic acid  1 mg Oral Daily   insulin aspart  0-9 Units Subcutaneous Q4H   ipratropium-albuterol  3 mL Nebulization BID   metoprolol tartrate  25 mg Oral BID   multivitamin with minerals  1 tablet Oral Daily   nicotine  21 mg Transdermal Daily   mouth rinse  15 mL Mouth Rinse 4 times per day   pantoprazole (PROTONIX) IV  40 mg Intravenous Q12H   potassium chloride  40 mEq Oral Once   [START ON 05/18/2023] predniSONE  45 mg Oral Q breakfast   Followed by   Melene Muller ON 05/19/2023] predniSONE  40 mg Oral Q breakfast   Followed by   Melene Muller ON 05/20/2023] predniSONE  35 mg Oral Q breakfast   Followed by   Melene Muller ON 05/21/2023] predniSONE  30 mg Oral Q breakfast   Followed by   Melene Muller ON 05/22/2023] predniSONE  25 mg Oral Q breakfast   Followed by   Melene Muller ON 05/23/2023] predniSONE  20 mg Oral Q breakfast   Followed by   Melene Muller ON 05/24/2023] predniSONE  15 mg Oral Q breakfast   Followed by   Melene Muller ON 05/25/2023] predniSONE  10 mg Oral Q breakfast   Followed by   Melene Muller ON 05/26/2023] predniSONE  5 mg Oral Q breakfast   QUEtiapine  50 mg Oral QHS   sodium chloride flush  10-40 mL  Intracatheter Q12H   thiamine  100 mg Oral Daily   Continuous Infusions:  sodium chloride 75 mL/hr at 05/17/23 1200   PRN Meds: acetaminophen, acetaminophen, guaiFENesin, haloperidol lactate, hydrALAZINE, labetalol, metoprolol tartrate, ondansetron (ZOFRAN) IV, mouth rinse, polyethylene glycol, sodium chloride flush  Time spent: 35 minutes  Author: Gillis Santa. MD [facility] 05/17/2023 2:48 PM  To reach On-call, see care teams to locate the attending and reach out to them via [email or web address]. If 7PM-7AM, please contact night-coverage If you still have difficulty reaching the attending provider, please page the [facility] (Director on Call) for [facility] on amion for assistance.

## 2023-05-17 NOTE — Progress Notes (Signed)
       CROSS COVER NOTE  NAME: Curtis [name] MRN: [id number] DOB : [birth year] ATTENDING PHYSICIAN: Gillis Santa, MD    Date of Service   05/17/2023   HPI/Events of Note   Notified of agitation Ambiguity surrounding current attending service. Last not was from pulm but [facility] listed as attending  Interventions   Assessment/Plan: Reviewed Dr Terence Lux prior note/plan 1 mg IV ativan ordered

## 2023-05-17 NOTE — Progress Notes (Signed)
Bladder scan revealed a volume of , order received to in and out cath the patient, in and out performed with NT present, volume obtained was .

## 2023-05-17 NOTE — Plan of Care (Signed)
 Continuing with plan of care.

## 2023-05-17 NOTE — Progress Notes (Signed)
NAME:  Curtis [name], MRN:  [id number], DOB:  [birth year], LOS: 22 ADMISSION DATE:  04/25/2023, CONSULTATION DATE:  04/25/23 CHIEF COMPLAINT:  Severe Hypothermia   Brief Pt Description / Synopsis:  68 year old male patient with a past medical history of incarcerated inguinal hernia s/p repair, SBO, alcohol withdrawal seizures, EtOH abuse who presented to [facility] on 04/24/2022 for unresponsiveness. He was found to be profoundly hypothermic with temperature measuring 77 F. Subsequently was intubated on 04/24/2022. He was actively rewarmed and started on stress dose steroids. Currently normothermic. Course also complicated by rhabdomyolysis with CK peaking at 6000 and currently downtrending. Finally course complicated by acute hypoxic respiratory failure suspecting aspiration for which she is on Zosyn and doxycycline. Tracheal aspirate w/ Moraxella Cat Patient obstacle to extubation is mental status remains agitated with SAT.  History of Present Illness:  le with significant PMH of incarcerated inguinal hernia s/p repair, SBO, alcohol withdrawal seizure, EtOH abuse who presented to the ED with chief complaints of unresponsiveness.   Per ED report, EMS was dispatched to patient's residence by a neighbor who found the patient unconscious.  On EMS arrival, patient was found unresponsive on floor of bedroom. Neighbor report LKNW was 3 days ago. Patient was initially combative and would tense resist any treatment. EMS report they were unable to obtain a temperature or spo2 due to hypothermia and unable to obtain bp due to combative resistance. patient initial cbg registered at 57 treated with 1mg  glucagon repeat reading were in the upper 20's   ED Course: Initial vital signs showed HR of 55 beats/minute, BP 99/47 mm Hg, the RR 22 breaths/minute, and the oxygen saturation 100% on NRB and a temperature of 60F (25C). Patient intubated in for airway protection. Pertinent Labs/Diagnostics Findings: Na+/ K+:131/4.8   Glucose: 286 BUN/Cr.: 31/1.91 CO2 1, Anion Gap 23, AST/ALT:284/83 WBC: 15.7K/L with neutrophil predominance  Lactic acid: >9.0 COVID PCR: Negative,  troponin: 32  CK 5249 ETOH:177 VBG: pO2 46.2; pCO2 72; pH 6.96;  HCO3 16.2, %O2 Sat 46.6.  CXR> CTH> CTA Chest> CT Abd/pelvis>see results below Medication administered in the IO:NGEXBMW given 30 cc/kg of fluids and started on broad-spectrum antibiotics with Ceftriaxone for suspected sepsis with septic shock.   05/15/23- patient on diet without aspiration events.  Nonfebrile. Had episode of Afrvr overnight, now rate controlled on amio.  He does have few electrolyte abnormalities with repletion ongoing. He seems to have development of alcohol withdrawal.  Rectal tube active.  Weaned to 6L/min [place].  Starting beta blockade for af.   05/16/23- patient with signs of Tardive dykinesia, have discussed with phamacist regarding therapy.  He is less anxious/aggitated with librium taper for EtOH withdrawal. AF is rate controlled.  Switching to po prednisone and eliquis today. 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  05/17/23- patient is comfortably resting in bed.  Family at bedside.  We answered questions and reviewed care plan.  Patient is still with confusion. Aspiration pneumonia is much improved.    Pertinent  Medical History   Past Medical History:  Diagnosis Date   EtOH dependence (HCC)    History of echocardiogram    a. 04/2023 Echo: EF >55%, mildly reduced RV fxn, triv MR.   Incarcerated inguinal hernia    a. 10/2016 s/p repair.    Micro Data:  1/9: SARS-CoV-2/RSV/influenza PCR>> negative 1/9: HIV screen>> nonreactive 1/9: Blood culture x 2>> no growth 1/9: MRSA PCR>> negative 1/10: Respiratory viral panel>>  negative 1/10: Mycoplasma pneumoniae IgM>> less than 770 1/10: Strep pneumo and Legionella urinary antigens>> negative 1/10: Tracheal aspirate>> Proteus mirabilis,  Moraxella Catarrhalis (beta lactamase +) 1/11: Acute viral hepatitis panel>> nonreactive 1/13: Blood culture x 2>> no growth 1/22: RVP>> negative  Antimicrobials:   Anti-infectives (From admission, onward)    Start     Dose/Rate Route Frequency Ordered Stop   05/08/23 1400  piperacillin-tazobactam (ZOSYN) IVPB 3.375 g  Status:  Discontinued        3.375 g 12.5 mL/hr over 240 Minutes Intravenous Every 8 hours 05/08/23 1012 05/14/23 1116   05/08/23 1000  Ampicillin-Sulbactam (UNASYN) 3 g in sodium chloride 0.9 % 100 mL IVPB  Status:  Discontinued        3 g 200 mL/hr over 30 Minutes Intravenous Every 6 hours 05/08/23 0803 05/08/23 1011   05/05/23 0900  cefTRIAXone (ROCEPHIN) 2 g in sodium chloride 0.9 % 100 mL IVPB        2 g 200 mL/hr over 30 Minutes Intravenous  Once 05/04/23 1202 05/05/23 1312   05/01/23 1030  cefTRIAXone (ROCEPHIN) 2 g in sodium chloride 0.9 % 100 mL IVPB        2 g 200 mL/hr over 30 Minutes Intravenous Daily 05/01/23 0932 05/04/23 1913   05/01/23 1000  cefTRIAXone (ROCEPHIN) 1 g in sodium chloride 0.9 % 100 mL IVPB  Status:  Discontinued        1 g 200 mL/hr over 30 Minutes Intravenous Every 24 hours 05/01/23 0756 05/01/23 0932   04/27/23 1400  piperacillin-tazobactam (ZOSYN) IVPB 3.375 g  Status:  Discontinued        3.375 g 12.5 mL/hr over 240 Minutes Intravenous Every 8 hours 04/27/23 0744 05/01/23 0756   04/27/23 1000  doxycycline (VIBRAMYCIN) 100 mg in dextrose 5 % 250 mL IVPB        100 mg 125 mL/hr over 120 Minutes Intravenous Every 12 hours 04/26/23 1358 04/29/23 1900   04/26/23 2200  piperacillin-tazobactam (ZOSYN) IVPB 4.5 g  Status:  Discontinued        4.5 g 200 mL/hr over 30 Minutes Intravenous Every 8 hours 04/26/23 1921 04/27/23 0744   04/26/23 1000  azithromycin (ZITHROMAX) 500 mg in sodium chloride 0.9 % 250 mL IVPB  Status:  Discontinued        500 mg 250 mL/hr over 60 Minutes Intravenous Every 24 hours 04/26/23 0401 04/26/23 1357   04/26/23  0600  piperacillin-tazobactam (ZOSYN) IVPB 3.375 g  Status:  Discontinued        3.375 g 12.5 mL/hr over 240 Minutes Intravenous Every 8 hours 04/26/23 0407 04/26/23 1921   04/25/23 1915  cefTRIAXone (ROCEPHIN) 2 g in sodium chloride 0.9 % 100 mL IVPB        2 g 200 mL/hr over 30 Minutes Intravenous  Once 04/25/23 1914 04/25/23 2013       Significant Hospital Events: Including procedures, antibiotic start and stop dates in addition to other pertinent events   04/24/2022 - Intubated, admit to ICU extremely hypotensive.  04/28/2022 - Started on Librium taper and Seroquel BID.  04/29/2022 - Remains with significant agitation on SAT and unable to proceed with extubation.  04/30/2022 Mental status with significant improvement. Following commands. Hgb trending down now at 7.3g/dl. Stools brown. NG tube to suction and no signs of bleeding.  05/01/2022 1pRBC given. Improved mental status.  05/02/2022 Patient H&H stable. Did well on SBT however mentation remains extremley poor with significant thick secretions therefore held off  on extubation.  05/03/2022 Remains with poor mental status, not doing well with SBT. My concern is that we are looking at a trach if no improvement in mental status in the next 48hrs.  05/05/2023: Initially tolerated WUA and SBT, however with BM and turning, agitated and severe hypoxia. 05/06/2023: On minimal vent settings, FAILED SBT.  Increase Seroquel to help with agitation.  Decrease free water flushes to 200 q4h. 05/07/2023: On minimal vent settings, perform SBT as tolerated. Diurese with 60 mg IV Lasix x1 dose.  EXTUBATED. 05/08/2023: Pt failed extubation due to worsening acute respiratory failure secondary to excessive secretions requiring reintubation. Pt febrile with worsening leukocytosis, unasyn started for aspiration pneumonia  05/09/2023: Pt remains mechanically intubated FiO2 55%/PEEP 5.  Currently sedated with precedex and low dose fentanyl gtt.  Pt bradycardic  will discontinue precedex gtt and restart propofol gtt 05/10/2023: On 50% FiO2.  Diurese with 40 mg IV Lasix x1 dose.  Triglycerides 702, d/c Propofol and change to Dilaudid, Precedex with prn versed pushes.  Developed A.fib with RVR, converted back to NSR with Amiodarone bous. 05/11/2023: Increased WOB on WUA. 05/12/2023: On minimal vent settings, plan for WUA and SBT when family arrives.  Will diurese with 40 mg IV Lasix x1 dose.   05/13/23- for SBT today, opening eyes but not consistently following verbal communication.  Family conference today.  Patient re-evaluated during SBT with passing parameters and liberated from MV.  Extubated 05/13/23 - Met with family reviewed overall comorbid and poor long term prognosis.  05/14/23- patient had slp with diet ordered, behavior is better mentation imrproved  Interim History / Subjective:  As outlined above under significant events   Objective   Blood pressure 131/78, pulse (!) 47, temperature 98.8 F (37.1 C), temperature source Oral, resp. rate 20, height 5' 7.01" (1.702 m), weight 67.1 kg, SpO2 (!) 87%.        Intake/Output Summary (Last 24 hours) at 05/17/2023 1118 Last data filed at 05/17/2023 0500 Gross per 24 hour  Intake 213.04 ml  Output 1760 ml  Net -1546.96 ml   Filed Weights   05/14/23 0450 05/15/23 0421 05/17/23 0500  Weight: 78.7 kg 71.1 kg 67.1 kg    Examination: General: NAD HENT: Atraumatic, normocephalic, neck supple, no JVD,  Lungs: Coarse throughout, even, non labored, Cardiovascular: Sinus rhythm s1s2, no m/r/g, 1+ radial/1+ distal pulses  Abdomen: +BS x4, soft, obese, slightly distended  Extremities: Normal bulk and tone, no deformities Neuro: no FND grossly GU: Indwelling foley catheter draining yellow urine   Resolved Hospital Problem list   Rhabdomyolysis  Thrombocytopenia   Assessment & Plan:   #Acute metabolic encephalopathy- RESOLVED  - Avoid sedating medications as able - Daily wake up assessment -  Continue thiamine, MVI, folic acid due to alcoholism hx  #Septic shock ~ RESOLVED #Intermittent atrial fibrillation with rvr ~ CONVERTED TO NSR #Elevated Troponin, suspect demand ischemia Echocardiogram 04/26/23: LVEF >55%, diastolic function unable to be evaluated, RV systolic function mildly reduced, RV size is normal -Continuous cardiac monitoring -Maintain MAP >65 -Vasopressors as needed to maintain MAP goal -Diuresis as BP and renal function permits - Prn metoprolol for sustained hr >120 bpm - Troponin peaked at 885  -TSH normal at 1.2 on 04/26/23  #Acute hypoxic respiratory failure -IMPROVED         Extubated 05/13/23  #Aspiration pneumonia (Trach aspirate with Moraxella Ctarrhalis and proteus Mirabilis, gram stain w/ Gram + Cocci)  #Aspiration pneumonia ((Trach aspirate with Moraxella Ctarrhalis and proteus Mirabilis, gram  stain w/ Gram + Cocci ) ~ TREATED -Monitor fever curve -Trend WBC's & Procalcitonin -Follow cultures as above -Continue empiric Zosyn pending cultures & sensitivities   #Alcohol withdrawal syndrome- resolved  On librium taper . S/p IV ativan 1mg  today   #AKI~RESOLVED #Hypernatremia~RESVOLED  #Hypokalemia - Trend BMP  - Replace electrolytes as indicated  - Strict I&O's  - Continue free water flush to 200 ml q4hrs  - Replace electrolytes as indicated~pharmacy following for assistance with electrolyte replacement  #Anemia without obvious signs of bleeding  CT Abd/Pelvis 05/02/2023: without any sign of intraperitoneal or retroperitoneal hematoma - Trend CBC - VTE px: subcutaneous lovenox  - Monitor for s/sx of bleeding  - Transfuse for Hgb <8 - GI consulted, appreciate input: No indication for EGD currently, continue PPI BID for SUP, if further drop in Hgb recommends Tagged RBC scan  #Hyperglycemia  #Hypoglycemia~resolved   - CBG's q4hrs  - Sensitive SSI  - Follow hyper/hypoglycemic protocol    Best Practice (right click and "Reselect all  SmartList Selections" daily)   Diet/type: tubefeeds DVT prophylaxis: LMWH GI prophylaxis: PPI Lines: Yes and still needed  Foley: Yes and still needed  Code Status:  DNR Last date of multidisciplinary goals of care discussion [05/12/23]   Labs   CBC: Recent Labs  Lab 05/13/23 0428 05/14/23 0445 05/15/23 0432 05/16/23 0427 05/17/23 0500  WBC 9.3 10.1 12.2* 9.9 11.2*  HGB 7.3* 7.3* 8.3* 8.7* 9.5*  HCT 23.1* 23.1* 26.9* 27.7* 29.7*  MCV 102.2* 104.5* 104.7* 102.2* 101.4*  PLT 223 170 311 316 349    Basic Metabolic Panel: Recent Labs  Lab 05/13/23 0428 05/13/23 1413 05/14/23 0445 05/15/23 0432 05/15/23 2018 05/16/23 0427 05/16/23 0636 05/17/23 0500  NA 144  --  144 147*  --  146*  --  148*  K 3.2*   < > 3.5 3.1* 3.7 3.2*  --  3.4*  CL 105  --  109 103  --  109  --  112*  CO2 29  --  28 26  --  24  --  24  GLUCOSE 134*  --  120* 89  --  102*  --  115*  BUN 30*  --  25* 19  --  13  --  11  CREATININE 1.02  --  0.78 0.89  --  0.84  --  0.80  CALCIUM 8.5*  --  8.4* 8.7*  --  9.2  --  9.0  MG 1.7   < > 1.7 1.6* 2.2 2.0  --  1.9  PHOS 2.8  --  2.4* 2.8  --   --  2.2* 3.3   < > = values in this interval not displayed.   GFR: Estimated Creatinine Clearance: 81.5 mL/min (by C-G formula based on SCr of 0.8 mg/dL). Recent Labs  Lab 05/14/23 0445 05/15/23 0432 05/16/23 0427 05/17/23 0500  WBC 10.1 12.2* 9.9 11.2*    Liver Function Tests: Recent Labs  Lab 05/11/23 0334  ALBUMIN 2.2*   No results for input(s): "LIPASE", "AMYLASE" in the last 168 hours. No results for input(s): "AMMONIA" in the last 168 hours.  ABG    Component Value Date/Time   PHART 7.41 05/08/2023 0947   PCO2ART 39 05/08/2023 0947   PO2ART 94 05/08/2023 0947   HCO3 24.7 05/08/2023 0947   ACIDBASEDEF 0.6 05/08/2023 0430   O2SAT 99.6 05/08/2023 0947     Coagulation Profile: No results for input(s): "INR", "PROTIME" in the last 168 hours.  Cardiac  Enzymes: No results for input(s):  "CKTOTAL", "CKMB", "CKMBINDEX", "TROPONINI" in the last 168 hours.  HbA1C: Hgb A1c MFr Bld  Date/Time Value Ref Range Status  04/27/2023 04:24 AM 5.1 4.8 - 5.6 % Final    Comment:    (NOTE) Pre diabetes:          5.7%-6.4%  Diabetes:              >6.4%  Glycemic control for   <7.0% adults with diabetes   04/26/2023 03:25 AM 5.1 4.8 - 5.6 % Final    Comment:    (NOTE) Pre diabetes:          5.7%-6.4%  Diabetes:              >6.4%  Glycemic control for   <7.0% adults with diabetes     CBG: Recent Labs  Lab 05/16/23 1556 05/16/23 1928 05/16/23 2312 05/17/23 0336 05/17/23 0735  GLUCAP 145* 95 114* 114* 101*    Review of Systems:   Unable to assess due to AMS/Intubation/sedation    Past Medical History:  He,  has a past medical history of EtOH dependence (HCC), History of echocardiogram, and Incarcerated inguinal hernia.   Surgical History:   Past Surgical History:  Procedure Laterality Date   INGUINAL HERNIA REPAIR Right 11/01/2016   Procedure: HERNIA REPAIR INGUINAL INCARCERATED;  Surgeon: Leafy Ro, MD;  Location: [facility] ORS;  Service: General;  Laterality: Right;   NO PAST SURGERIES       Social History:   reports that he has been smoking cigarettes. His smokeless tobacco use includes chew. He reports current alcohol use of about 126.0 standard drinks of alcohol per week. He reports that he does not use drugs.   Family History:  His Family history is unknown by patient.   Allergies Allergies  Allergen Reactions   Robinul [Glycopyrrolate] Other (See Comments)    Arrhythmia's     Home Medications  Prior to Admission medications   Not on File     Critical care provider statement:   Total critical care time: 32 minutes   Performed by: Karna Christmas MD   Critical care time was exclusive of separately billable procedures and treating other patients.   Critical care was necessary to treat or prevent imminent or life-threatening deterioration.    Critical care was time spent personally by me on the following activities: development of treatment plan with patient and/or surrogate as well as nursing, discussions with consultants, evaluation of patient's response to treatment, examination of patient, obtaining history from patient or surrogate, ordering and performing treatments and interventions, ordering and review of laboratory studies, ordering and review of radiographic studies, pulse oximetry and re-evaluation of patient's condition.    Vida Rigger, M.D.  Pulmonary & Critical Care Medicine

## 2023-05-18 DIAGNOSIS — G9341 Metabolic encephalopathy: Secondary | ICD-10-CM | POA: Diagnosis not present

## 2023-05-18 LAB — GLUCOSE, CAPILLARY
Glucose-Capillary: 103 mg/dL — ABNORMAL HIGH (ref 70–99)
Glucose-Capillary: 134 mg/dL — ABNORMAL HIGH (ref 70–99)
Glucose-Capillary: 141 mg/dL — ABNORMAL HIGH (ref 70–99)
Glucose-Capillary: 175 mg/dL — ABNORMAL HIGH (ref 70–99)
Glucose-Capillary: 86 mg/dL (ref 70–99)

## 2023-05-18 LAB — PHOSPHORUS: Phosphorus: 2.6 mg/dL (ref 2.5–4.6)

## 2023-05-18 LAB — BASIC METABOLIC PANEL
Anion gap: 9 (ref 5–15)
BUN: 11 mg/dL (ref 8–23)
CO2: 20 mmol/L — ABNORMAL LOW (ref 22–32)
Calcium: 7.8 mg/dL — ABNORMAL LOW (ref 8.9–10.3)
Chloride: 117 mmol/L — ABNORMAL HIGH (ref 98–111)
Creatinine, Ser: 0.7 mg/dL (ref 0.61–1.24)
GFR, Estimated: >60 mL/min (ref >60)
Glucose, Bld: 104 mg/dL — ABNORMAL HIGH (ref 70–99)
Potassium: 3 mmol/L — ABNORMAL LOW (ref 3.5–5.1)
Sodium: 146 mmol/L — ABNORMAL HIGH (ref 135–145)

## 2023-05-18 LAB — CBC
HCT: 24.9 % — ABNORMAL LOW (ref 39.0–52.0)
Hemoglobin: 7.7 g/dL — ABNORMAL LOW (ref 13.0–17.0)
MCH: 32.1 pg (ref 26.0–34.0)
MCHC: 30.9 g/dL (ref 30.0–36.0)
MCV: 103.8 fL — ABNORMAL HIGH (ref 80.0–100.0)
Platelets: 287 10*3/uL (ref 150–400)
RBC: 2.4 MIL/uL — ABNORMAL LOW (ref 4.22–5.81)
RDW: 15.1 % (ref 11.5–15.5)
WBC: 8.8 10*3/uL (ref 4.0–10.5)
nRBC: 0 % (ref 0.0–0.2)

## 2023-05-18 LAB — MAGNESIUM: Magnesium: 1.4 mg/dL — ABNORMAL LOW (ref 1.7–2.4)

## 2023-05-18 LAB — HEMOGLOBIN AND HEMATOCRIT, BLOOD
HCT: 31.2 % — ABNORMAL LOW (ref 39.0–52.0)
Hemoglobin: 9.8 g/dL — ABNORMAL LOW (ref 13.0–17.0)

## 2023-05-18 LAB — CK: Total CK: 25 U/L — ABNORMAL LOW (ref 49–397)

## 2023-05-18 MED ORDER — POTASSIUM CHLORIDE CRYS ER 20 MEQ PO TBCR
40.0000 meq | EXTENDED_RELEASE_TABLET | Freq: Once | ORAL | Status: AC
  Administered 2023-05-18: 40 meq via ORAL
  Filled 2023-05-18: qty 2

## 2023-05-18 MED ORDER — MAGNESIUM SULFATE 2 GM/50ML IV SOLN
2.0000 g | Freq: Once | INTRAVENOUS | Status: AC
  Administered 2023-05-18: 2 g via INTRAVENOUS
  Filled 2023-05-18: qty 50

## 2023-05-18 MED ORDER — SODIUM CHLORIDE 0.45 % IV SOLN: INTRAVENOUS | Status: AC

## 2023-05-18 MED ORDER — POTASSIUM CHLORIDE 10 MEQ/100ML IV SOLN
10.0000 meq | INTRAVENOUS | Status: AC
  Administered 2023-05-18 (×4): 10 meq via INTRAVENOUS
  Filled 2023-05-18 (×4): qty 100

## 2023-05-18 MED ORDER — INSULIN ASPART 100 UNIT/ML IJ SOLN
0.0000 [IU] | Freq: Three times a day (TID) | INTRAMUSCULAR | Status: DC
  Administered 2023-05-18: 2 [IU] via SUBCUTANEOUS
  Administered 2023-05-18 – 2023-05-20 (×5): 1 [IU] via SUBCUTANEOUS
  Administered 2023-05-21 (×2): 2 [IU] via SUBCUTANEOUS
  Filled 2023-05-18 (×8): qty 1

## 2023-05-18 MED ORDER — METOPROLOL TARTRATE 50 MG PO TABS
50.0000 mg | ORAL_TABLET | Freq: Two times a day (BID) | ORAL | Status: DC
  Administered 2023-05-18 – 2023-05-21 (×7): 50 mg via ORAL
  Filled 2023-05-18 (×7): qty 1

## 2023-05-18 MED ORDER — IPRATROPIUM-ALBUTEROL 0.5-2.5 (3) MG/3ML IN SOLN: 3.0000 mL | RESPIRATORY_TRACT | Status: DC | PRN

## 2023-05-18 MED ORDER — QUETIAPINE FUMARATE 25 MG PO TABS
25.0000 mg | ORAL_TABLET | Freq: Every evening | ORAL | Status: DC
  Administered 2023-05-18 – 2023-05-21 (×3): 25 mg via ORAL
  Filled 2023-05-18 (×5): qty 1

## 2023-05-18 MED ORDER — ACETAMINOPHEN 325 MG PO TABS
650.0000 mg | ORAL_TABLET | Freq: Four times a day (QID) | ORAL | Status: DC | PRN
  Administered 2023-05-21: 650 mg via ORAL
  Filled 2023-05-18: qty 2

## 2023-05-18 NOTE — TOC Initial Note (Signed)
Transition of Care [facility]) - Initial/Assessment Note    Patient Details  Name: Curtis [name] MRN: [id number] Date of Birth: 1955/07/25  Transition of Care [facility]) CM/SW Contact:    Maree Krabbe, LCSW Phone Number: 05/18/2023, 12:52 PM  Clinical Narrative:     SW called pt's daughter to discuss snf had to leave vm. SW called pt's aunt and she said pr's sister was coming over there and she would talk to her about it and call SW back.              Expected Discharge Plan: Skilled Nursing Facility Barriers to Discharge: Continued Medical Work up   Patient Goals and CMS Choice   CMS Medicare.gov Compare Post Acute Care list provided to:: Patient Represenative (must comment) Choice offered to / list presented to : Adult Children      Expected Discharge Plan and Services In-house Referral: Clinical Social Work   Post Acute Care Choice: Skilled Nursing Facility Living arrangements for the past 2 months: Single Family Home                                      Prior Living Arrangements/Services Living arrangements for the past 2 months: Single Family Home   Patient language and need for interpreter reviewed:: No        Need for Family Participation in Patient Care: Yes (Comment) Care giver support system in place?: Yes (comment)   Criminal Activity/Legal Involvement Pertinent to Current Situation/Hospitalization: No - Comment as needed  Activities of Daily Living      Permission Sought/Granted      Share Information with NAME: Chana Bode     Permission granted to share info w Relationship: daughter, aunt     Emotional Assessment Appearance:: Appears stated age Attitude/Demeanor/Rapport: Unable to Assess Affect (typically observed): Unable to Assess Orientation: : Oriented to Self, Oriented to Place Alcohol / Substance Use: Not Applicable    Admission diagnosis:  Urinary tract infection, acute [N39.0] Hypothermia, initial encounter [T68.XXXA] Acute  metabolic encephalopathy [G93.41] Sepsis with encephalopathy, due to unspecified organism, unspecified whether septic shock present (HCC) [A41.9, R65.20, G93.41] Patient Active Problem List   Diagnosis Date Noted   Demand ischemia (HCC) 04/27/2023   Shock circulatory (HCC) 04/26/2023   Sepsis with encephalopathy (HCC) 04/26/2023   Hypothermia 04/26/2023   Non-traumatic rhabdomyolysis 04/26/2023   AKI (acute kidney injury) (HCC) 04/26/2023   NSTEMI (non-ST elevated myocardial infarction) (HCC) 04/26/2023   Urinary tract infection, acute 04/26/2023   Acute metabolic encephalopathy 04/25/2023   Incarcerated hernia 11/02/2016   Malnutrition of moderate degree 11/02/2016   Incarcerated inguinal hernia    SBO (small bowel obstruction) (HCC)    Incarcerated right inguinal hernia    Seizures (HCC) 06/26/2016   Non-recurrent unilateral inguinal hernia without obstruction or gangrene    Alcohol withdrawal (HCC) 06/21/2016   PCP:  Patient, No Pcp Per Pharmacy:   [id number] [place], [place] - [address] [place] [address] Phone: [phone] Fax: [phone]  [facility] [address] [place] - [address] [place] [address] Phone: [phone] Fax: [phone]  [id number], [place] - [address] AT [facility] [address] [facility] [place] [address] Phone: [phone] Fax: [phone]     Social Drivers of Health (SDOH) Social History: SDOH  Screenings   Tobacco Use: High Risk (04/25/2023)   SDOH Interventions:     Readmission Risk Interventions     No data to display

## 2023-05-18 NOTE — [place] FL2 (Signed)
[place] MEDICAID FL2 LEVEL OF CARE FORM     IDENTIFICATION  Patient Name: Curtis [name] Birthdate: 12/07/1955 Sex: male Admission Date (Current Location): 04/25/2023  [facility] and [place] Number:  Chiropodist and Address:  [facility], [address] [place], [place] [address]      Provider Number: [id number]  Attending Physician Name and Address:  Gillis Santa, MD  Relative Name and Phone Number:       Current Level of Care: Hospital Recommended Level of Care: Skilled Nursing Facility Prior Approval Number:    Date Approved/Denied: 05/18/23 PASRR Number: [id number]  Discharge Plan: SNF    Current Diagnoses: Patient Active Problem List   Diagnosis Date Noted   Demand ischemia (HCC) 04/27/2023   Shock circulatory (HCC) 04/26/2023   Sepsis with encephalopathy (HCC) 04/26/2023   Hypothermia 04/26/2023   Non-traumatic rhabdomyolysis 04/26/2023   AKI (acute kidney injury) (HCC) 04/26/2023   NSTEMI (non-ST elevated myocardial infarction) (HCC) 04/26/2023   Urinary tract infection, acute 04/26/2023   Acute metabolic encephalopathy 04/25/2023   Incarcerated hernia 11/02/2016   Malnutrition of moderate degree 11/02/2016   Incarcerated inguinal hernia    SBO (small bowel obstruction) (HCC)    Incarcerated right inguinal hernia    Seizures (HCC) 06/26/2016   Non-recurrent unilateral inguinal hernia without obstruction or gangrene    Alcohol withdrawal (HCC) 06/21/2016    Orientation RESPIRATION BLADDER Height & Weight     Self, Place  O2 (NC2L) Incontinent Weight: 159 lb 13.3 oz (72.5 kg) Height:  5' 7.01" (170.2 cm)  BEHAVIORAL SYMPTOMS/MOOD NEUROLOGICAL BOWEL NUTRITION STATUS      Incontinent Diet (DYS 1 thin liquids)  AMBULATORY STATUS COMMUNICATION OF NEEDS Skin   Extensive Assist Verbally PU Stage and Appropriate Care   PU Stage 2 Dressing:  (buttocks, foam dressing, change PRN. Anus no dressing)                    Personal Care Assistance Level of Assistance  Bathing, Feeding, Dressing Bathing Assistance: Maximum assistance Feeding assistance: Limited assistance       Functional Limitations Info  Sight, Hearing, Speech Sight Info: Adequate Hearing Info: Adequate Speech Info: Adequate    SPECIAL CARE FACTORS FREQUENCY  PT (By licensed PT), OT (By licensed OT)     PT Frequency: 5x OT Frequency: 5x            Contractures Contractures Info: Not present    Additional Factors Info  Code Status, Allergies Code Status Info: DNR Allergies Info: Robinul (Glycopyrrolate)           Current Medications (05/18/2023):  This is the current hospital active medication list Current Facility-Administered Medications  Medication Dose Route Frequency Provider Last Rate Last Admin   acetaminophen (TYLENOL) suppository 650 mg  650 mg Rectal Q6H PRN Ezequiel Essex, NP       acetaminophen (TYLENOL) tablet 650 mg  650 mg Oral Q6H PRN Tressie Ellis, RPH       apixaban (ELIQUIS) tablet 5 mg  5 mg Oral BID Vida Rigger, MD   5 mg at 05/18/23 5621   ascorbic acid (VITAMIN C) tablet 500 mg  500 mg Oral BID Vida Rigger, MD   500 mg at 05/18/23 0944   chlordiazePOXIDE (LIBRIUM) capsule 25 mg  25 mg Oral Daily Judithe Modest, NP       Chlorhexidine Gluconate Cloth 2 % PADS 6 each  6 each Topical Nightly Raechel Chute, MD  6 each at 05/17/23 2124   feeding supplement (NEPRO CARB STEADY) liquid 237 mL  237 mL Oral TID BM Vida Rigger, MD   237 mL at 05/18/23 0943   folic acid (FOLVITE) tablet 1 mg  1 mg Oral Daily Lowella Bandy, RPH   1 mg at 05/18/23 0944   guaiFENesin (ROBITUSSIN) 100 MG/5ML liquid 5 mL  5 mL Oral Q4H PRN Ezequiel Essex, NP   5 mL at 05/17/23 2122   haloperidol lactate (HALDOL) injection 2 mg  2 mg Intravenous Q6H PRN Gillis Santa, MD   2 mg at 05/17/23 2013   hydrALAZINE (APRESOLINE) injection 10-20 mg  10-20 mg Intravenous Q4H PRN Rust-Chester, Cecelia Byars, NP   10 mg at  05/17/23 1610   insulin aspart (novoLOG) injection 0-9 Units  0-9 Units Subcutaneous TID AC & HS Gillis Santa, MD   1 Units at 05/18/23 1159   ipratropium-albuterol (DUONEB) 0.5-2.5 (3) MG/3ML nebulizer solution 3 mL  3 mL Nebulization Q4H PRN Gillis Santa, MD       labetalol (NORMODYNE) injection 10-20 mg  10-20 mg Intravenous Q2H PRN Rust-Chester, Cecelia Byars, NP   20 mg at 05/17/23 0844   metoprolol tartrate (LOPRESSOR) injection 2.5-5 mg  2.5-5 mg Intravenous Q6H PRN Ezequiel Essex, NP   5 mg at 05/16/23 1021   metoprolol tartrate (LOPRESSOR) tablet 50 mg  50 mg Oral BID Gillis Santa, MD   50 mg at 05/18/23 9604   multivitamin with minerals tablet 1 tablet  1 tablet Oral Daily Lowella Bandy, RPH   1 tablet at 05/18/23 0945   nicotine (NICODERM CQ - dosed in mg/24 hours) patch 21 mg  21 mg Transdermal Daily Gillis Santa, MD   21 mg at 05/18/23 0944   ondansetron (ZOFRAN) injection 4 mg  4 mg Intravenous Q6H PRN Ezequiel Essex, NP   4 mg at 05/14/23 5409   Oral care mouth rinse  15 mL Mouth Rinse 4 times per day Gillis Santa, MD   15 mL at 05/18/23 1201   Oral care mouth rinse  15 mL Mouth Rinse PRN Gillis Santa, MD       pantoprazole (PROTONIX) injection 40 mg  40 mg Intravenous Q12H Assaker, [place], MD   40 mg at 05/18/23 0944   polyethylene glycol (MIRALAX / GLYCOLAX) packet 17 g  17 g Oral Daily PRN Jimmye Norman, NP       Melene Muller ON 05/19/2023] predniSONE (DELTASONE) tablet 40 mg  40 mg Oral Q breakfast Vida Rigger, MD       Followed by   Melene Muller ON 05/20/2023] predniSONE (DELTASONE) tablet 35 mg  35 mg Oral Q breakfast Vida Rigger, MD       Followed by   Melene Muller ON 05/21/2023] predniSONE (DELTASONE) tablet 30 mg  30 mg Oral Q breakfast Vida Rigger, MD       Followed by   Melene Muller ON 05/22/2023] predniSONE (DELTASONE) tablet 25 mg  25 mg Oral Q breakfast Vida Rigger, MD       Followed by   Melene Muller ON 05/23/2023] predniSONE (DELTASONE) tablet 20 mg  20 mg Oral Q  breakfast Vida Rigger, MD       Followed by   Melene Muller ON 05/24/2023] predniSONE (DELTASONE) tablet 15 mg  15 mg Oral Q breakfast Vida Rigger, MD       Followed by   Melene Muller ON 05/25/2023] predniSONE (DELTASONE) tablet 10 mg  10 mg Oral Q breakfast Vida Rigger, MD  Followed by   Melene Muller ON 05/26/2023] predniSONE (DELTASONE) tablet 5 mg  5 mg Oral Q breakfast Vida Rigger, MD       QUEtiapine (SEROQUEL) tablet 25 mg  25 mg Oral QPM Gillis Santa, MD       sodium chloride flush (NS) 0.9 % injection 10-40 mL  10-40 mL Intracatheter Q12H Erin Fulling, MD   10 mL at 05/18/23 1055   sodium chloride flush (NS) 0.9 % injection 10-40 mL  10-40 mL Intracatheter PRN Erin Fulling, MD       thiamine (VITAMIN B1) tablet 100 mg  100 mg Oral Daily Lowella Bandy, RPH   100 mg at 05/18/23 6213     Discharge Medications: Please see discharge summary for a list of discharge medications.  Relevant Imaging Results:  Relevant Lab Results:   Additional Information SSN:[id number]  Reuel Boom Thadd Apuzzo, LCSW

## 2023-05-18 NOTE — Progress Notes (Signed)
Patient was transferred with all belongings and placed in new room.

## 2023-05-18 NOTE — Plan of Care (Signed)

## 2023-05-18 NOTE — Progress Notes (Addendum)
[facility] Progress Note  Patient: Curtis [name]    UJW:[id number]  DOA: 04/25/2023     Date of Service: the patient was seen and examined on 05/18/2023  Chief Complaint  Patient presents with   unresponsive   Brief hospital course: 68 year old male patient with a past medical history of incarcerated inguinal hernia s/p repair, SBO, alcohol withdrawal seizures, EtOH abuse who presented to [facility] on 04/24/2022 for unresponsiveness. He was found to be profoundly hypothermic with temperature measuring 77 F. Subsequently was intubated on 04/24/2022. He was actively rewarmed and started on stress dose steroids. Currently normothermic. Course also complicated by rhabdomyolysis with CK peaking at 6000 and currently downtrending. Finally course complicated by acute hypoxic respiratory failure suspecting aspiration for which she is on Zosyn and doxycycline. Tracheal aspirate w/ Moraxella Cat Patient obstacle to extubation is mental status remains agitated with SAT.   Per ED report, EMS was dispatched to patient's residence by a neighbor who found the patient unconscious.  On EMS arrival, patient was found unresponsive on floor of bedroom. Neighbor report LKNW was 3 days ago. Patient was initially combative and would tense resist any treatment. EMS report they were unable to obtain a temperature or spo2 due to hypothermia and unable to obtain bp due to combative resistance. patient initial cbg registered at 24 treated with 1mg  glucagon repeat reading were in the upper 20's   ED Course: Initial vital signs showed HR of 55 beats/minute, BP 99/47 mm Hg, the RR 22 breaths/minute, and the oxygen saturation 100% on NRB and a temperature of 26F (25C). Patient intubated in for airway protection. Pertinent Labs/Diagnostics Findings: Na+/ K+:131/4.8  Glucose: 286 BUN/Cr.: 31/1.91 CO2 1, Anion Gap 23, AST/ALT:284/83 WBC: 15.7K/L with neutrophil predominance  Lactic acid: >9.0 COVID PCR: Negative,  troponin: 32   CK 5249 ETOH:177 VBG: pO2 46.2; pCO2 72; pH 6.96;  HCO3 16.2, %O2 Sat 46.6.  CXR> CTH> CTA Chest> CT Abd/pelvis>see results below Medication administered in the FA:OZHYQMV given 30 cc/kg of fluids and started on broad-spectrum antibiotics with Ceftriaxone for suspected sepsis with septic shock.    05/15/23- patient on diet without aspiration events.  Nonfebrile. Had episode of Afrvr overnight, now rate controlled on amio.  He does have few electrolyte abnormalities with repletion ongoing. He seems to have development of alcohol withdrawal.  Rectal tube active.  Weaned to 6L/min [place].  Starting beta blockade for af.   05/16/23- patient with signs of Tardive dykinesia, have discussed with phamacist regarding therapy.  He is less anxious/aggitated with librium taper for EtOH withdrawal. AF is rate controlled.  Switching to po prednisone and eliquis . 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   Micro Data:  1/9: SARS-CoV-2/RSV/influenza PCR>> negative 1/9: HIV screen>> nonreactive 1/9: Blood culture x 2>> no growth 1/9: MRSA PCR>> negative 1/10: Respiratory viral panel>> negative 1/10: Mycoplasma pneumoniae IgM>> less than 770 1/10: Strep pneumo and Legionella urinary antigens>> negative 1/10: Tracheal aspirate>> Proteus mirabilis, Moraxella Catarrhalis (beta lactamase +) 1/11: Acute viral hepatitis panel>> nonreactive 1/13: Blood culture x 2>> no growth 1/22: RVP>> negative  Significant Hospital Events:  04/24/2022 - Intubated, admit to ICU extremely hypotensive.  04/28/2022 - Started on Librium taper and Seroquel BID.  04/29/2022 - Remains with significant agitation on SAT and unable to proceed with extubation.  04/30/2022 Mental status with significant improvement. Following commands. Hgb trending down now at 7.3g/dl. Stools brown. NG tube to suction and no signs  of bleeding.  05/01/2022 1pRBC given. Improved mental  status.  05/02/2022 Patient H&H stable. Did well on SBT however mentation remains extremley poor with significant thick secretions therefore held off on extubation.  05/03/2022 Remains with poor mental status, not doing well with SBT. My concern is that we are looking at a trach if no improvement in mental status in the next 48hrs.  05/05/2023: Initially tolerated WUA and SBT, however with BM and turning, agitated and severe hypoxia. 05/06/2023: On minimal vent settings, FAILED SBT.  Increase Seroquel to help with agitation.  Decrease free water flushes to 200 q4h. 05/07/2023: On minimal vent settings, perform SBT as tolerated. Diurese with 60 mg IV Lasix x1 dose.  EXTUBATED. 05/08/2023: Pt failed extubation due to worsening acute respiratory failure secondary to excessive secretions requiring reintubation. Pt febrile with worsening leukocytosis, unasyn started for aspiration pneumonia  05/09/2023: Pt remains mechanically intubated FiO2 55%/PEEP 5.  Currently sedated with precedex and low dose fentanyl gtt.  Pt bradycardic will discontinue precedex gtt and restart propofol gtt 05/10/2023: On 50% FiO2.  Diurese with 40 mg IV Lasix x1 dose.  Triglycerides 702, d/c Propofol and change to Dilaudid, Precedex with prn versed pushes.  Developed A.fib with RVR, converted back to NSR with Amiodarone bous. 05/11/2023: Increased WOB on WUA. 05/12/2023: On minimal vent settings, plan for WUA and SBT when family arrives.  Will diurese with 40 mg IV Lasix x1 dose.   05/13/23- for SBT today, opening eyes but not consistently following verbal communication.  Family conference today.  Patient re-evaluated during SBT with passing parameters and liberated from MV.  Extubated 05/13/23 - Met with family reviewed overall comorbid and poor long term prognosis.  05/14/23- patient had slp with diet ordered, behavior is better mentation imrproved  Assessment and Plan: # Acute metabolic encephalopathy - Avoid sedating medications  as able - Daily wake up assessment - Continue thiamine, MVI, folic acid due to alcoholism hx   # Septic shock ~ RESOLVED # Intermittent atrial fibrillation with rvr ~ CONVERTED TO NSR # Elevated Troponin, suspect demand ischemia Echocardiogram 04/26/23: LVEF >55%, diastolic function unable to be evaluated, RV systolic function mildly reduced, RV size is normal -Continuous cardiac monitoring -Maintain MAP >65 -Vasopressors as needed to maintain MAP goal -Diuresis as BP and renal function permits - Prn metoprolol for sustained hr >120 bpm - Troponin peaked at 885  -TSH normal at 1.2 on 04/26/23   # Acute hypoxic respiratory failure -IMPROVED Extubated on 05/13/23     # Aspiration pneumonia ((Trach aspirate with Moraxella Ctarrhalis and proteus Mirabilis, gram stain w/ Gram + Cocci ) ~ TREATED -Monitor fever curve -Trend WBC's & Procalcitonin -Follow cultures as above -Continue empiric Zosyn pending cultures & sensitivities   # A-fib with RVR Rate controlled now Continue Lopressor 50 mg p.o. twice daily Started Eliquis 5 mg p.o. twice daily 2/1 repeat EKG to check rhythm Cardiology consulted   # Alcohol withdrawal syndrome- resolved  On librium taper . S/p IV ativan 1mg  today    # AKI~RESOLVED # Hypernatremia~RESVOLED  # Hypokalemia - Trend BMP  - Replace electrolytes as indicated  - Strict I&O's  - Continue free water flush to 200 ml q4hrs  - Replace electrolytes as indicated~pharmacy following for assistance with electrolyte replacement   # Anemia without obvious signs of bleeding  CT Abd/Pelvis 05/02/2023: without any sign of intraperitoneal or retroperitoneal hematoma - Trend CBC - VTE px: subcutaneous lovenox  - Monitor for s/sx of bleeding  - Transfuse  for Hgb <7 - GI consulted, appreciate input: No indication for EGD currently, continue PPI BID for SUP, if further drop in Hgb recommends Tagged RBC scan 2/1 Hb 7.7, repeated 9.8, possible error in a.m. labs   #  Hyperglycemia  # Hypoglycemia~resolved   - CBG's q4hrs  - Sensitive SSI  - Follow hyper/hypoglycemic protocol   # Hypomagnesemia, mag repleted. Monitor electrolytes and replete as needed.   Body mass index is 25.03 kg/m.  Nutrition Problem: Inadequate oral intake Etiology: inability to eat (pt sedated and ventilated) Interventions: Interventions: Tube feeding  Pressure Injury 05/07/23 Buttocks Right Stage 2 -  Partial thickness loss of dermis presenting as a shallow open injury with a red, pink wound bed without slough. (Active)  05/07/23 1139  Location: Buttocks  Location Orientation: Right  Staging: Stage 2 -  Partial thickness loss of dermis presenting as a shallow open injury with a red, pink wound bed without slough.  Wound Description (Comments):   Present on Admission:   Dressing Type Foam - Lift dressing to assess site every shift 05/18/23 0814     Pressure Injury 05/07/23 Anus Mid Stage 2 -  Partial thickness loss of dermis presenting as a shallow open injury with a red, pink wound bed without slough. (Active)  05/07/23 1810  Location: Anus  Location Orientation: Mid  Staging: Stage 2 -  Partial thickness loss of dermis presenting as a shallow open injury with a red, pink wound bed without slough.  Wound Description (Comments):   Present on Admission:   Dressing Type None 05/18/23 0814     Diet: Dysphagia 1 diet DVT Prophylaxis: Eliquis  Advance goals of care discussion: DNR/DNI-limited  Family Communication: family was not present at bedside, at the time of interview.  The pt provided permission to discuss medical plan with the family. Opportunity was given to ask question and all questions were answered satisfactorily.   Disposition:  Pt is from Home, admitted with EtOH withdrawal, respiratory failure, pneumonia, was admitted in the ICU, intubated.  Stabilized and downgraded under hospital service.  Patient still has altered mental status. which precludes a  safe discharge. Discharge to SNF, when stable, may need few more days to improve.  Subjective: No significant events overnight, patient remained confused, AO x 1, unable to offer any complaints. Due to confusion patient is on one-to-one observation   Physical Exam: General: NAD, lying comfortably Appear in no distress, affect appropriate Eyes: PERRLA ENT: Oral Mucosa Clear, moist  Neck: no JVD,  Cardiovascular: S1 and S2 Present, no Murmur,  Respiratory: good respiratory effort, Bilateral Air entry equal and Decreased, no Crackles, no wheezes Abdomen: Bowel Sound present, Soft and no tenderness,  Skin: no rashes Extremities: no Pedal edema, no calf tenderness Neurologic: without any new focal findings Gait not checked due to patient safety concerns  Vitals:   05/18/23 0528 05/18/23 0806 05/18/23 0827 05/18/23 1119  BP: (!) 163/63 (!) 161/77  (!) 120/92  Pulse: 86 (!) 107  98  Resp: 20     Temp: 98.2 F (36.8 C) 99 F (37.2 C)  99.1 F (37.3 C)  TempSrc: Oral     SpO2: 98% 100% 98% 100%  Weight:      Height:        Intake/Output Summary (Last 24 hours) at 05/18/2023 1400 Last data filed at 05/18/2023 0500 Gross per 24 hour  Intake 1125 ml  Output 1745 ml  Net -620 ml   Filed Weights   05/15/23 0421 05/17/23  0500 05/18/23 0500  Weight: 71.1 kg 67.1 kg 72.5 kg    Data Reviewed: I have personally reviewed and interpreted daily labs, tele strips, imagings as discussed above. I reviewed all nursing notes, pharmacy notes, vitals, pertinent old records I have discussed plan of care as described above with RN and patient/family.  CBC: Recent Labs  Lab 05/14/23 0445 05/15/23 0432 05/16/23 0427 05/17/23 0500 05/18/23 0428  WBC 10.1 12.2* 9.9 11.2* 8.8  HGB 7.3* 8.3* 8.7* 9.5* 7.7*  HCT 23.1* 26.9* 27.7* 29.7* 24.9*  MCV 104.5* 104.7* 102.2* 101.4* 103.8*  PLT 170 311 316 349 287   Basic Metabolic Panel: Recent Labs  Lab 05/14/23 0445 05/15/23 0432  05/15/23 2018 05/16/23 0427 05/16/23 0636 05/17/23 0500 05/18/23 0428  NA 144 147*  --  146*  --  148* 146*  K 3.5 3.1* 3.7 3.2*  --  3.4* 3.0*  CL 109 103  --  109  --  112* 117*  CO2 28 26  --  24  --  24 20*  GLUCOSE 120* 89  --  102*  --  115* 104*  BUN 25* 19  --  13  --  11 11  CREATININE 0.78 0.89  --  0.84  --  0.80 0.70  CALCIUM 8.4* 8.7*  --  9.2  --  9.0 7.8*  MG 1.7 1.6* 2.2 2.0  --  1.9 1.4*  PHOS 2.4* 2.8  --   --  2.2* 3.3 2.6    Studies: No results found.  Scheduled Meds:  apixaban  5 mg Oral BID   vitamin C  500 mg Oral BID   chlordiazePOXIDE  25 mg Oral Daily   Chlorhexidine Gluconate Cloth  6 each Topical Nightly   feeding supplement (NEPRO CARB STEADY)  237 mL Oral TID BM   folic acid  1 mg Oral Daily   insulin aspart  0-9 Units Subcutaneous TID AC & HS   metoprolol tartrate  50 mg Oral BID   multivitamin with minerals  1 tablet Oral Daily   nicotine  21 mg Transdermal Daily   mouth rinse  15 mL Mouth Rinse 4 times per day   pantoprazole (PROTONIX) IV  40 mg Intravenous Q12H   [START ON 05/19/2023] predniSONE  40 mg Oral Q breakfast   Followed by   Melene Muller ON 05/20/2023] predniSONE  35 mg Oral Q breakfast   Followed by   Melene Muller ON 05/21/2023] predniSONE  30 mg Oral Q breakfast   Followed by   Melene Muller ON 05/22/2023] predniSONE  25 mg Oral Q breakfast   Followed by   Melene Muller ON 05/23/2023] predniSONE  20 mg Oral Q breakfast   Followed by   Melene Muller ON 05/24/2023] predniSONE  15 mg Oral Q breakfast   Followed by   Melene Muller ON 05/25/2023] predniSONE  10 mg Oral Q breakfast   Followed by   Melene Muller ON 05/26/2023] predniSONE  5 mg Oral Q breakfast   QUEtiapine  25 mg Oral QPM   sodium chloride flush  10-40 mL Intracatheter Q12H   thiamine  100 mg Oral Daily   Continuous Infusions:   PRN Meds: acetaminophen, acetaminophen, guaiFENesin, haloperidol lactate, hydrALAZINE, ipratropium-albuterol, labetalol, metoprolol tartrate, ondansetron (ZOFRAN) IV, mouth rinse, polyethylene  glycol, sodium chloride flush  Time spent: 35 minutes  Author: Gillis Santa. MD [facility] 05/18/2023 2:00 PM  To reach On-call, see care teams to locate the attending and reach out to them via [email or web address]. If 7PM-7AM, please contact night-coverage  If you still have difficulty reaching the attending provider, please page the [facility] (Director on Call) for [facility] on amion for assistance.

## 2023-05-18 NOTE — Progress Notes (Signed)
Patient report called to Levi,RN. Answered all questions and concerns. Called daughter and made her aware of transfer. Will transfer care at this time.

## 2023-05-19 DIAGNOSIS — I4892 Unspecified atrial flutter: Secondary | ICD-10-CM

## 2023-05-19 DIAGNOSIS — G9341 Metabolic encephalopathy: Secondary | ICD-10-CM | POA: Diagnosis not present

## 2023-05-19 LAB — CBC
HCT: 29.1 % — ABNORMAL LOW (ref 39.0–52.0)
Hemoglobin: 9.2 g/dL — ABNORMAL LOW (ref 13.0–17.0)
MCH: 32.3 pg (ref 26.0–34.0)
MCHC: 31.6 g/dL (ref 30.0–36.0)
MCV: 102.1 fL — ABNORMAL HIGH (ref 80.0–100.0)
Platelets: 377 10*3/uL (ref 150–400)
RBC: 2.85 MIL/uL — ABNORMAL LOW (ref 4.22–5.81)
RDW: 15 % (ref 11.5–15.5)
WBC: 12.9 10*3/uL — ABNORMAL HIGH (ref 4.0–10.5)
nRBC: 0 % (ref 0.0–0.2)

## 2023-05-19 LAB — BASIC METABOLIC PANEL
Anion gap: 11 (ref 5–15)
BUN: 13 mg/dL (ref 8–23)
CO2: 22 mmol/L (ref 22–32)
Calcium: 9.1 mg/dL (ref 8.9–10.3)
Chloride: 108 mmol/L (ref 98–111)
Creatinine, Ser: 0.81 mg/dL (ref 0.61–1.24)
GFR, Estimated: >60 mL/min (ref >60)
Glucose, Bld: 121 mg/dL — ABNORMAL HIGH (ref 70–99)
Potassium: 3.5 mmol/L (ref 3.5–5.1)
Sodium: 141 mmol/L (ref 135–145)

## 2023-05-19 LAB — GLUCOSE, CAPILLARY
Glucose-Capillary: 90 mg/dL (ref 70–99)
Glucose-Capillary: 98 mg/dL (ref 70–99)
Glucose-Capillary: 126 mg/dL — ABNORMAL HIGH (ref 70–99)
Glucose-Capillary: 126 mg/dL — ABNORMAL HIGH (ref 70–99)
Glucose-Capillary: 105 mg/dL — ABNORMAL HIGH (ref 70–99)

## 2023-05-19 LAB — PHOSPHORUS: Phosphorus: 2.7 mg/dL (ref 2.5–4.6)

## 2023-05-19 LAB — MAGNESIUM: Magnesium: 1.5 mg/dL — ABNORMAL LOW (ref 1.7–2.4)

## 2023-05-19 MED ORDER — HYOSCYAMINE SULFATE 0.125 MG PO TBDP
0.2500 mg | ORAL_TABLET | Freq: Once | ORAL | Status: AC
Start: 2023-05-19 — End: 2023-05-19
  Administered 2023-05-19: 0.25 mg via SUBLINGUAL
  Filled 2023-05-19: qty 2

## 2023-05-19 MED ORDER — POTASSIUM CHLORIDE CRYS ER 20 MEQ PO TBCR
40.0000 meq | EXTENDED_RELEASE_TABLET | Freq: Once | ORAL | Status: AC
  Administered 2023-05-19: 40 meq via ORAL
  Filled 2023-05-19: qty 2

## 2023-05-19 MED ORDER — IPRATROPIUM-ALBUTEROL 0.5-2.5 (3) MG/3ML IN SOLN
3.0000 mL | Freq: Four times a day (QID) | RESPIRATORY_TRACT | Status: DC
  Administered 2023-05-19: 3 mL via RESPIRATORY_TRACT
  Filled 2023-05-19: qty 3

## 2023-05-19 MED ORDER — HYOSCYAMINE SULFATE 0.5 MG/ML IJ SOLN: 0.2500 mg | Freq: Once | INTRAMUSCULAR | Status: DC

## 2023-05-19 MED ORDER — LORATADINE 10 MG PO TABS
10.0000 mg | ORAL_TABLET | Freq: Every day | ORAL | Status: DC
Start: 2023-05-19 — End: 2023-05-22
  Administered 2023-05-19 – 2023-05-21 (×3): 10 mg via ORAL
  Filled 2023-05-19 (×3): qty 1

## 2023-05-19 MED ORDER — MAGNESIUM SULFATE 4 GM/100ML IV SOLN
4.0000 g | Freq: Once | INTRAVENOUS | Status: AC
  Administered 2023-05-19: 4 g via INTRAVENOUS
  Filled 2023-05-19: qty 100

## 2023-05-19 MED ORDER — BUDESONIDE 0.25 MG/2ML IN SUSP
0.2500 mg | Freq: Two times a day (BID) | RESPIRATORY_TRACT | Status: DC
  Administered 2023-05-19 – 2023-05-21 (×6): 0.25 mg via RESPIRATORY_TRACT
  Filled 2023-05-19 (×6): qty 2

## 2023-05-19 MED ORDER — ARFORMOTEROL TARTRATE 15 MCG/2ML IN NEBU
15.0000 ug | INHALATION_SOLUTION | Freq: Two times a day (BID) | RESPIRATORY_TRACT | Status: DC
  Administered 2023-05-19 – 2023-05-21 (×5): 15 ug via RESPIRATORY_TRACT
  Filled 2023-05-19 (×6): qty 2

## 2023-05-19 MED ORDER — IPRATROPIUM-ALBUTEROL 0.5-2.5 (3) MG/3ML IN SOLN
3.0000 mL | Freq: Two times a day (BID) | RESPIRATORY_TRACT | Status: DC
  Administered 2023-05-19 – 2023-05-21 (×5): 3 mL via RESPIRATORY_TRACT
  Filled 2023-05-19 (×5): qty 3

## 2023-05-19 MED ORDER — PANTOPRAZOLE SODIUM 40 MG PO TBEC
40.0000 mg | DELAYED_RELEASE_TABLET | Freq: Every day | ORAL | Status: DC
  Administered 2023-05-19 – 2023-05-21 (×3): 40 mg via ORAL
  Filled 2023-05-19 (×3): qty 1

## 2023-05-19 MED ORDER — AMLODIPINE BESYLATE 5 MG PO TABS
5.0000 mg | ORAL_TABLET | Freq: Every day | ORAL | Status: DC
Start: 2023-05-19 — End: 2023-05-22
  Administered 2023-05-19 – 2023-05-21 (×3): 5 mg via ORAL
  Filled 2023-05-19 (×3): qty 1

## 2023-05-19 NOTE — Progress Notes (Signed)
[facility] Progress Note  Patient: Curtis [name]    WUJ:[id number]  DOA: 04/25/2023     Date of Service: the patient was seen and examined on 05/19/2023  Chief Complaint  Patient presents with   unresponsive   Brief hospital course: 68 year old male patient with a past medical history of incarcerated inguinal hernia s/p repair, SBO, alcohol withdrawal seizures, EtOH abuse who presented to [facility] on 04/24/2022 for unresponsiveness. He was found to be profoundly hypothermic with temperature measuring 77 F. Subsequently was intubated on 04/24/2022. He was actively rewarmed and started on stress dose steroids. Currently normothermic. Course also complicated by rhabdomyolysis with CK peaking at 6000 and currently downtrending. Finally course complicated by acute hypoxic respiratory failure suspecting aspiration for which she is on Zosyn and doxycycline. Tracheal aspirate w/ Moraxella Cat Patient obstacle to extubation is mental status remains agitated with SAT.   Per ED report, EMS was dispatched to patient's residence by a neighbor who found the patient unconscious.  On EMS arrival, patient was found unresponsive on floor of bedroom. Neighbor report LKNW was 3 days ago. Patient was initially combative and would tense resist any treatment. EMS report they were unable to obtain a temperature or spo2 due to hypothermia and unable to obtain bp due to combative resistance. patient initial cbg registered at 87 treated with 1mg  glucagon repeat reading were in the upper 20's   ED Course: Initial vital signs showed HR of 55 beats/minute, BP 99/47 mm Hg, the RR 22 breaths/minute, and the oxygen saturation 100% on NRB and a temperature of 75F (25C). Patient intubated in for airway protection. Pertinent Labs/Diagnostics Findings: Na+/ K+:131/4.8  Glucose: 286 BUN/Cr.: 31/1.91 CO2 1, Anion Gap 23, AST/ALT:284/83 WBC: 15.7K/L with neutrophil predominance  Lactic acid: >9.0 COVID PCR: Negative,  troponin: 32   CK 5249 ETOH:177 VBG: pO2 46.2; pCO2 72; pH 6.96;  HCO3 16.2, %O2 Sat 46.6.  CXR> CTH> CTA Chest> CT Abd/pelvis>see results below Medication administered in the NF:AOZHYQM given 30 cc/kg of fluids and started on broad-spectrum antibiotics with Ceftriaxone for suspected sepsis with septic shock.    05/15/23- patient on diet without aspiration events.  Nonfebrile. Had episode of Afrvr overnight, now rate controlled on amio.  He does have few electrolyte abnormalities with repletion ongoing. He seems to have development of alcohol withdrawal.  Rectal tube active.  Weaned to 6L/min [place].  Starting beta blockade for af.   05/16/23- patient with signs of Tardive dykinesia, have discussed with phamacist regarding therapy.  He is less anxious/aggitated with librium taper for EtOH withdrawal. AF is rate controlled.  Switching to po prednisone and eliquis . 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   Micro Data:  1/9: SARS-CoV-2/RSV/influenza PCR>> negative 1/9: HIV screen>> nonreactive 1/9: Blood culture x 2>> no growth 1/9: MRSA PCR>> negative 1/10: Respiratory viral panel>> negative 1/10: Mycoplasma pneumoniae IgM>> less than 770 1/10: Strep pneumo and Legionella urinary antigens>> negative 1/10: Tracheal aspirate>> Proteus mirabilis, Moraxella Catarrhalis (beta lactamase +) 1/11: Acute viral hepatitis panel>> nonreactive 1/13: Blood culture x 2>> no growth 1/22: RVP>> negative  Significant Hospital Events:  04/24/2022 - Intubated, admit to ICU extremely hypotensive.  04/28/2022 - Started on Librium taper and Seroquel BID.  04/29/2022 - Remains with significant agitation on SAT and unable to proceed with extubation.  04/30/2022 Mental status with significant improvement. Following commands. Hgb trending down now at 7.3g/dl. Stools brown. NG tube to suction and no signs  of bleeding.  05/01/2022 1pRBC given. Improved mental  status.  05/02/2022 Patient H&H stable. Did well on SBT however mentation remains extremley poor with significant thick secretions therefore held off on extubation.  05/03/2022 Remains with poor mental status, not doing well with SBT. My concern is that we are looking at a trach if no improvement in mental status in the next 48hrs.  05/05/2023: Initially tolerated WUA and SBT, however with BM and turning, agitated and severe hypoxia. 05/06/2023: On minimal vent settings, FAILED SBT.  Increase Seroquel to help with agitation.  Decrease free water flushes to 200 q4h. 05/07/2023: On minimal vent settings, perform SBT as tolerated. Diurese with 60 mg IV Lasix x1 dose.  EXTUBATED. 05/08/2023: Pt failed extubation due to worsening acute respiratory failure secondary to excessive secretions requiring reintubation. Pt febrile with worsening leukocytosis, unasyn started for aspiration pneumonia  05/09/2023: Pt remains mechanically intubated FiO2 55%/PEEP 5.  Currently sedated with precedex and low dose fentanyl gtt.  Pt bradycardic will discontinue precedex gtt and restart propofol gtt 05/10/2023: On 50% FiO2.  Diurese with 40 mg IV Lasix x1 dose.  Triglycerides 702, d/c Propofol and change to Dilaudid, Precedex with prn versed pushes.  Developed A.fib with RVR, converted back to NSR with Amiodarone bous. 05/11/2023: Increased WOB on WUA. 05/12/2023: On minimal vent settings, plan for WUA and SBT when family arrives.  Will diurese with 40 mg IV Lasix x1 dose.   05/13/23- for SBT today, opening eyes but not consistently following verbal communication.  Family conference today.  Patient re-evaluated during SBT with passing parameters and liberated from MV.  Extubated 05/13/23 - Met with family reviewed overall comorbid and poor long term prognosis.  05/14/23- patient had slp with diet ordered, behavior is better mentation imrproved  Assessment and Plan: # Acute metabolic encephalopathy - Avoid sedating medications  as able - Daily wake up assessment - Continue thiamine, MVI, folic acid due to alcoholism hx   # Septic shock ~ RESOLVED # Intermittent atrial fibrillation with rvr ~ CONVERTED TO NSR # Elevated Troponin, suspect demand ischemia Echocardiogram 04/26/23: LVEF >55%, diastolic function unable to be evaluated, RV systolic function mildly reduced, RV size is normal -Continuous cardiac monitoring -Maintain MAP >65 -Vasopressors as needed to maintain MAP goal -Diuresis as BP and renal function permits - Prn metoprolol for sustained hr >120 bpm - Troponin peaked at 885  -TSH normal at 1.2 on 04/26/23   # Acute hypoxic respiratory failure  Extubated on 05/13/23 2/2 noticed worsening of shortness of breath and COPD exacerbation Patient is already on tapering dose of prednisone Started Brovana and budesonide nebulizer twice daily, DuoNeb every 6 hourly scheduled, it can be transition to as needed after improvement Continue supplemental O2 inhalation and gradually wean off   # Aspiration pneumonia ((Trach aspirate with Moraxella Ctarrhalis and proteus Mirabilis, gram stain w/ Gram + Cocci ) ~ TREATED -Monitor fever curve -Trend WBC's & Procalcitonin -Follow cultures as above -Continue empiric Zosyn pending cultures & sensitivities   # A-fib with RVR Rate controlled now Continue Lopressor 50 mg p.o. twice daily Started Eliquis 5 mg p.o. twice daily 2/1 repeat EKG to check rhythm Follow-up with cardiology for further recommendation   # Hypertension Continue Lopressor 50 mg p.o. twice daily Started amlodipine 5 mg p.o. daily Monitor BP and titrate medications accordingly   # Alcohol withdrawal syndrome- resolved  On librium taper . S/p IV ativan 1mg  today    # AKI~RESOLVED # Hypernatremia~RESVOLED  # Hypokalemia - Trend  BMP  - Replace electrolytes as indicated  - Strict I&O's  - Continue free water flush to 200 ml q4hrs  - Replace electrolytes as indicated~pharmacy following for  assistance with electrolyte replacement   # Anemia without obvious signs of bleeding  CT Abd/Pelvis 05/02/2023: without any sign of intraperitoneal or retroperitoneal hematoma - Trend CBC - VTE px: subcutaneous lovenox  - Monitor for s/sx of bleeding  - Transfuse for Hgb <7 - GI consulted, appreciate input: No indication for EGD currently, continue PPI BID for SUP, if further drop in Hgb recommends Tagged RBC scan 2/1 Hb 7.7, repeated 9.8, possible error in a.m. labs   # Hyperglycemia  # Hypoglycemia~resolved   - CBG's q4hrs  - Sensitive SSI  - Follow hyper/hypoglycemic protocol   # Hypomagnesemia, mag repleted. Monitor electrolytes and replete as needed.   Body mass index is 24.82 kg/m.  Nutrition Problem: Inadequate oral intake Etiology: inability to eat (pt sedated and ventilated) Interventions: Interventions: Tube feeding  Pressure Injury 05/07/23 Buttocks Right Stage 2 -  Partial thickness loss of dermis presenting as a shallow open injury with a red, pink wound bed without slough. (Active)  05/07/23 1139  Location: Buttocks  Location Orientation: Right  Staging: Stage 2 -  Partial thickness loss of dermis presenting as a shallow open injury with a red, pink wound bed without slough.  Wound Description (Comments):   Present on Admission:   Dressing Type Foam - Lift dressing to assess site every shift 05/19/23 0800     Pressure Injury 05/07/23 Anus Mid Stage 2 -  Partial thickness loss of dermis presenting as a shallow open injury with a red, pink wound bed without slough. (Active)  05/07/23 1810  Location: Anus  Location Orientation: Mid  Staging: Stage 2 -  Partial thickness loss of dermis presenting as a shallow open injury with a red, pink wound bed without slough.  Wound Description (Comments):   Present on Admission:   Dressing Type None 05/19/23 0800     Diet: Dysphagia 1 diet DVT Prophylaxis: Eliquis  Advance goals of care discussion:  DNR/DNI-limited  Family Communication: family was not present at bedside, at the time of interview.  The pt provided permission to discuss medical plan with the family. Opportunity was given to ask question and all questions were answered satisfactorily.   Disposition:  Pt is from Home, admitted with EtOH withdrawal, respiratory failure, pneumonia, was admitted in the ICU, intubated.  Stabilized and downgraded under hospital service.  Patient still has altered mental status. which precludes a safe discharge. Discharge to SNF, when stable, may need few more days to improve.  Subjective: No significant events overnight, patient remained confused, AO x 1 Patient was short of breath, but he did not complain about it as he did not realize it Did not offer any complaints, no chest pain or palpitations, no any other active issues Patient remained very altered  Physical Exam: General: Patient was lying comfortably, very confused and short of breath Appear in no distress, affect appropriate Eyes: PERRLA ENT: Oral Mucosa Clear, moist  Neck: no JVD,  Cardiovascular: S1 and S2 Present, no Murmur,  Respiratory: Equal air entry bilaterally, mild crackles and wheezes bilaterally Abdomen: Bowel Sound present, Soft and no tenderness,  Skin: no rashes Extremities: no Pedal edema, no calf tenderness Neurologic: without any new focal findings Gait not checked due to patient safety concerns  Vitals:   05/19/23 0649 05/19/23 0810 05/19/23 0950 05/19/23 1219  BP:  (!) 185/79 Marland Kitchen)  165/62 (!) 141/74  Pulse:  88 96 79  Resp:  20  20  Temp:  97.6 F (36.4 C)  98.2 F (36.8 C)  TempSrc:  Oral  Oral  SpO2:  97%  100%  Weight: 71.9 kg     Height:        Intake/Output Summary (Last 24 hours) at 05/19/2023 1353 Last data filed at 05/19/2023 1241 Gross per 24 hour  Intake 525.5 ml  Output 500 ml  Net 25.5 ml   Filed Weights   05/17/23 0500 05/18/23 0500 05/19/23 0649  Weight: 67.1 kg 72.5 kg 71.9 kg     Data Reviewed: I have personally reviewed and interpreted daily labs, tele strips, imagings as discussed above. I reviewed all nursing notes, pharmacy notes, vitals, pertinent old records I have discussed plan of care as described above with RN and patient/family.  CBC: Recent Labs  Lab 05/15/23 0432 05/16/23 0427 05/17/23 0500 05/18/23 0428 05/18/23 1227 05/19/23 0944  WBC 12.2* 9.9 11.2* 8.8  --  12.9*  HGB 8.3* 8.7* 9.5* 7.7* 9.8* 9.2*  HCT 26.9* 27.7* 29.7* 24.9* 31.2* 29.1*  MCV 104.7* 102.2* 101.4* 103.8*  --  102.1*  PLT 311 316 349 287  --  377   Basic Metabolic Panel: Recent Labs  Lab 05/15/23 0432 05/15/23 2018 05/16/23 0427 05/16/23 0636 05/17/23 0500 05/18/23 0428 05/19/23 0944  NA 147*  --  146*  --  148* 146* 141  K 3.1* 3.7 3.2*  --  3.4* 3.0* 3.5  CL 103  --  109  --  112* 117* 108  CO2 26  --  24  --  24 20* 22  GLUCOSE 89  --  102*  --  115* 104* 121*  BUN 19  --  13  --  11 11 13   CREATININE 0.89  --  0.84  --  0.80 0.70 0.81  CALCIUM 8.7*  --  9.2  --  9.0 7.8* 9.1  MG 1.6* 2.2 2.0  --  1.9 1.4* 1.5*  PHOS 2.8  --   --  2.2* 3.3 2.6 2.7    Studies: No results found.  Scheduled Meds:  amLODipine  5 mg Oral Daily   apixaban  5 mg Oral BID   arformoterol  15 mcg Nebulization BID   vitamin C  500 mg Oral BID   budesonide (PULMICORT) nebulizer solution  0.25 mg Nebulization BID   Chlorhexidine Gluconate Cloth  6 each Topical Nightly   feeding supplement (NEPRO CARB STEADY)  237 mL Oral TID BM   folic acid  1 mg Oral Daily   insulin aspart  0-9 Units Subcutaneous TID AC & HS   ipratropium-albuterol  3 mL Nebulization BID   metoprolol tartrate  50 mg Oral BID   multivitamin with minerals  1 tablet Oral Daily   nicotine  21 mg Transdermal Daily   mouth rinse  15 mL Mouth Rinse 4 times per day   pantoprazole  40 mg Oral Daily   [START ON 05/20/2023] predniSONE  35 mg Oral Q breakfast   Followed by   Melene Muller ON 05/21/2023] predniSONE  30 mg  Oral Q breakfast   Followed by   Melene Muller ON 05/22/2023] predniSONE  25 mg Oral Q breakfast   Followed by   Melene Muller ON 05/23/2023] predniSONE  20 mg Oral Q breakfast   Followed by   Melene Muller ON 05/24/2023] predniSONE  15 mg Oral Q breakfast   Followed by   Melene Muller ON  05/25/2023] predniSONE  10 mg Oral Q breakfast   Followed by   Melene Muller ON 05/26/2023] predniSONE  5 mg Oral Q breakfast   QUEtiapine  25 mg Oral QPM   sodium chloride flush  10-40 mL Intracatheter Q12H   thiamine  100 mg Oral Daily   Continuous Infusions:  sodium chloride 75 mL/hr at 05/18/23 1424   magnesium sulfate bolus IVPB 4 g (05/19/23 1215)    PRN Meds: acetaminophen, acetaminophen, guaiFENesin, haloperidol lactate, hydrALAZINE, labetalol, metoprolol tartrate, ondansetron (ZOFRAN) IV, mouth rinse, polyethylene glycol, sodium chloride flush  Time spent: 55 minutes  Author: Gillis Santa. MD [facility] 05/19/2023 1:53 PM  To reach On-call, see care teams to locate the attending and reach out to them via [email or web address]. If 7PM-7AM, please contact night-coverage If you still have difficulty reaching the attending provider, please page the [facility] (Director on Call) for [facility] on amion for assistance.

## 2023-05-19 NOTE — Plan of Care (Signed)
  Problem: Education: Goal: Knowledge of General Education information will improve Description: Including pain rating scale, medication(s)/side effects and non-pharmacologic comfort measures Outcome: Progressing   Problem: Clinical Measurements: Goal: Ability to maintain clinical measurements within normal limits will improve Outcome: Progressing Goal: Will remain free from infection Outcome: Progressing Goal: Diagnostic test results will improve Outcome: Progressing Goal: Respiratory complications will improve Outcome: Progressing Goal: Cardiovascular complication will be avoided Outcome: Progressing   Problem: Nutrition: Goal: Adequate nutrition will be maintained Outcome: Progressing   Problem: Elimination: Goal: Will not experience complications related to bowel motility Outcome: Progressing Goal: Will not experience complications related to urinary retention Outcome: Progressing   Problem: Safety: Goal: Ability to remain free from injury will improve Outcome: Progressing   Problem: Nutritional: Goal: Maintenance of adequate nutrition will improve Outcome: Progressing

## 2023-05-19 NOTE — Plan of Care (Signed)
  Problem: Clinical Measurements: Goal: Ability to maintain clinical measurements within normal limits will improve Outcome: Progressing   Problem: Skin Integrity: Goal: Risk for impaired skin integrity will decrease Outcome: Progressing   Problem: Nutritional: Goal: Progress toward achieving an optimal weight will improve Outcome: Progressing   Problem: Nutritional: Goal: Maintenance of adequate nutrition will improve Outcome: Progressing

## 2023-05-19 NOTE — Progress Notes (Signed)
Daughter tried to talk to this patient but unable to speak properly due to thick secretion. Suction done as needed,  RT informed, MD made aware ordered Loratadine, hyoscyamine Tab SL and suction.

## 2023-05-19 NOTE — Progress Notes (Signed)
Rounding Note    Patient Name: Curtis [name] Date of Encounter: 05/19/2023  [facility] Cardiologist: Yvonne Kendall, MD   Subjective   Asked to re-evaluate patient; last seen on 04/28/23 by cardiology. Concern is that he is in atrial fibrillation/atrial flutter. Reviewed extensive chart/clinical course this hospitalization. Has had persistent metabolic encephalopathy/altered mental status. He is not interactive on evaluation today.  Inpatient Medications    Scheduled Meds:  amLODipine  5 mg Oral Daily   apixaban  5 mg Oral BID   arformoterol  15 mcg Nebulization BID   vitamin C  500 mg Oral BID   budesonide (PULMICORT) nebulizer solution  0.25 mg Nebulization BID   Chlorhexidine Gluconate Cloth  6 each Topical Nightly   feeding supplement (NEPRO CARB STEADY)  237 mL Oral TID BM   folic acid  1 mg Oral Daily   insulin aspart  0-9 Units Subcutaneous TID AC & HS   ipratropium-albuterol  3 mL Nebulization BID   loratadine  10 mg Oral Daily   metoprolol tartrate  50 mg Oral BID   multivitamin with minerals  1 tablet Oral Daily   nicotine  21 mg Transdermal Daily   mouth rinse  15 mL Mouth Rinse 4 times per day   pantoprazole  40 mg Oral Daily   [START ON 05/20/2023] predniSONE  35 mg Oral Q breakfast   Followed by   Melene Muller ON 05/21/2023] predniSONE  30 mg Oral Q breakfast   Followed by   Melene Muller ON 05/22/2023] predniSONE  25 mg Oral Q breakfast   Followed by   Melene Muller ON 05/23/2023] predniSONE  20 mg Oral Q breakfast   Followed by   Melene Muller ON 05/24/2023] predniSONE  15 mg Oral Q breakfast   Followed by   Melene Muller ON 05/25/2023] predniSONE  10 mg Oral Q breakfast   Followed by   Melene Muller ON 05/26/2023] predniSONE  5 mg Oral Q breakfast   QUEtiapine  25 mg Oral QPM   sodium chloride flush  10-40 mL Intracatheter Q12H   thiamine  100 mg Oral Daily   Continuous Infusions:   PRN Meds: acetaminophen, acetaminophen, guaiFENesin, haloperidol lactate, hydrALAZINE, labetalol, metoprolol  tartrate, ondansetron (ZOFRAN) IV, mouth rinse, polyethylene glycol, sodium chloride flush   Vital Signs    Vitals:   05/19/23 0649 05/19/23 0810 05/19/23 0950 05/19/23 1219  BP:  (!) 185/79 (!) 165/62 (!) 141/74  Pulse:  88 96 79  Resp:  20  20  Temp:  97.6 F (36.4 C)  98.2 F (36.8 C)  TempSrc:  Oral  Oral  SpO2:  97%  100%  Weight: 71.9 kg     Height:        Intake/Output Summary (Last 24 hours) at 05/19/2023 1602 Last data filed at 05/19/2023 1241 Gross per 24 hour  Intake 525.5 ml  Output 500 ml  Net 25.5 ml      05/19/2023    6:49 AM 05/18/2023    5:00 AM 05/17/2023    5:00 AM  Last 3 Weights  Weight (lbs) 158 lb 8.2 oz 159 lb 13.3 oz 147 lb 14.9 oz  Weight (kg) 71.9 kg 72.5 kg 67.1 kg      Telemetry    Converted to SR 05/18/23 at 20:14 PM - Personally Reviewed  Physical Exam   GEN: No acute distress.   Neck: No JVD Cardiac: RRR, no murmurs, rubs, or gallops.  Respiratory: Clear to auscultation bilaterally. GI: Soft, nontender, non-distended  MS: No  edema; No deformity. Neuro:  Nonfocal, not interactive  New pertinent results (labs, ECG, imaging, cardiac studies)    ECG from 05/18/23 with atrial flutter at 120 bpm No new CV testing except for ECG  Patient Profile     68 y.o. male with extensive hospital course as noted below, whom we are asked to see for paroxysmal atrial fibrillation  Assessment & Plan    Atrial fibrillation/flutter, paroxysmal -given extensive comorbidities, would aim for rate control strategy at this time -currently in sinus rhythm -CHA2DS2/VAS Stroke Risk Points=2 -continue apixaban, metoprolol    Prolonged hospital course, including: Rhabdomyolysis Demand ischemia Acute hypoxic respiratory failure, required reintubation Septic shock, aspiration pneumonia Alcohol withdrawal Agitation, Metabolic encephalopathy -admitted 04/25/23, found down, was profoundly hypothermic -has been admitted for >3 weeks. Per notes, concern for poor  prognosis -management per primary team  Very complex patient with extensive hospital course, high complexity medical decision making.  Cardiology will sign off at this time, but if new questions arise please do not hesitate to contact us. Once he is approaching discharge please let the team know so we can arrange for outpatient follow up.    Signed, Jodelle Red, MD  05/19/2023, 4:02 PM

## 2023-05-20 DIAGNOSIS — G9341 Metabolic encephalopathy: Secondary | ICD-10-CM | POA: Diagnosis not present

## 2023-05-20 LAB — CBC
HCT: 29.2 % — ABNORMAL LOW (ref 39.0–52.0)
Hemoglobin: 9.2 g/dL — ABNORMAL LOW (ref 13.0–17.0)
MCH: 31.7 pg (ref 26.0–34.0)
MCHC: 31.5 g/dL (ref 30.0–36.0)
MCV: 100.7 fL — ABNORMAL HIGH (ref 80.0–100.0)
Platelets: 348 10*3/uL (ref 150–400)
RBC: 2.9 MIL/uL — ABNORMAL LOW (ref 4.22–5.81)
RDW: 15 % (ref 11.5–15.5)
WBC: 9.1 10*3/uL (ref 4.0–10.5)
nRBC: 0 % (ref 0.0–0.2)

## 2023-05-20 LAB — BASIC METABOLIC PANEL
Anion gap: 10 (ref 5–15)
BUN: 12 mg/dL (ref 8–23)
CO2: 25 mmol/L (ref 22–32)
Calcium: 9.3 mg/dL (ref 8.9–10.3)
Chloride: 108 mmol/L (ref 98–111)
Creatinine, Ser: 0.84 mg/dL (ref 0.61–1.24)
GFR, Estimated: >60 mL/min (ref >60)
Glucose, Bld: 94 mg/dL (ref 70–99)
Potassium: 3.9 mmol/L (ref 3.5–5.1)
Sodium: 143 mmol/L (ref 135–145)

## 2023-05-20 LAB — GLUCOSE, CAPILLARY
Glucose-Capillary: 74 mg/dL (ref 70–99)
Glucose-Capillary: 106 mg/dL — ABNORMAL HIGH (ref 70–99)
Glucose-Capillary: 122 mg/dL — ABNORMAL HIGH (ref 70–99)
Glucose-Capillary: 142 mg/dL — ABNORMAL HIGH (ref 70–99)
Glucose-Capillary: 95 mg/dL (ref 70–99)
Glucose-Capillary: 108 mg/dL — ABNORMAL HIGH (ref 70–99)
Glucose-Capillary: 141 mg/dL — ABNORMAL HIGH (ref 70–99)
Glucose-Capillary: 115 mg/dL — ABNORMAL HIGH (ref 70–99)
Glucose-Capillary: 117 mg/dL — ABNORMAL HIGH (ref 70–99)

## 2023-05-20 LAB — MAGNESIUM: Magnesium: 1.8 mg/dL (ref 1.7–2.4)

## 2023-05-20 LAB — PHOSPHORUS: Phosphorus: 3.5 mg/dL (ref 2.5–4.6)

## 2023-05-20 NOTE — Progress Notes (Signed)
Speech Language Pathology Treatment: Dysphagia  Patient Details Name: Curtis [name] MRN: [id number] DOB: 06-23-1955 Today's Date: 05/20/2023 Time: 1012-1030 SLP Time Calculation (min) (ACUTE ONLY): 18 min  Assessment / Plan / Recommendation Clinical Impression  Pt seen for skilled ST intervention targeting goals for tolerance of least restrictive diet. Pt was reclined in bed with breakfast tray in front of him. Pt was repositioned to upright, and accepted trials of thin liquid and puree textures.  Extended oral prep noted on pureed sausage/pancakes. Timely oral prep and clearing of cream of wheat. No overt s/s aspiration observed following puree trials. Pt accepted trials of thin liquid as well, taking multiple large consecutive boluses. No overt s/s aspiration observed following thin liquids. Pt did exhibit intermittent belching after PO trials, raising suspicion for esophageal dysmotility.   Pt declined trial of solid texture (graham cracker), indicating he "might try something later". RN reports difficulty with oral management of whole pill in puree, but appears to tolerate crushed meds. Pt verbally receptive to education regarding safe swallow precautions - upright during PO intake and for 30 minutes after meals, small bites/sips at slow rate, alternate solids and liquids to minimize oral pocketing. Carryover is questionable given suspected cognitive decline.   Will continue Dys1 (puree) diet with thin liquids, crushed meds. Safe swallow precautions posted at [facility]. SLP will follow briefly for continued assessment of diet tolerance and education. Pt likely to require pureed solids going forward, given dentition and mental status.   HPI HPI: Pt is a 68 y.o male with significant PMH of incarcerated inguinal hernia s/p repair, SBO, alcohol withdrawal seizure, EtOH abuse who presented to the ED with chief complaints of unresponsiveness.  Pt admitted w/ dxs including: Acute Metabolic Encephalopathy In the  setting of severe metabolic acidosis, severe hypothermia and hypoglycemia  #Hx of seizure-likely EtOH withdrawal seizures; Multifocal Pneumonia; Acute Hypoxic Respiratory Failure.  Per ED report, EMS was dispatched to patient's residence by a neighbor who found the patient unconscious.  On EMS arrival, patient was found unresponsive on floor of bedroom. Neighbor report LKNW was 3 days ago.  Pt was intubated, admit to ICU extremely hypotensive; hypothermia.   CXR at admit: negative for acute issues.  CXR on 1/25: Bibasilar collapse/consolidation, right greater than left with  small right and tiny left pleural effusions.   During this admit, pt was orally intubated at admit on 04/25/23; extubated on 05/13/2023 with failed trial of extubation on 05/07/23.      SLP Plan  Continue with current plan of care      Recommendations for follow up therapy are one component of a multi-disciplinary discharge planning process, led by the attending physician.  Recommendations may be updated based on patient status, additional functional criteria and insurance authorization.    Recommendations  Diet recommendations: Dysphagia 1 (puree);Thin liquid Liquids provided via: Straw;Cup Medication Administration: Crushed with puree Supervision: Staff to assist with self feeding;Full supervision/cueing for compensatory strategies Compensations: Minimize environmental distractions;Slow rate;Small sips/bites;Lingual sweep for clearance of pocketing;Follow solids with liquid Postural Changes and/or Swallow Maneuvers: Out of bed for meals;Seated upright 90 degrees;Upright 30-60 min after meal                  Oral care before and after PO;Staff/trained caregiver to provide oral care   Frequent or constant Supervision/Assistance Dysphagia, oropharyngeal phase (R13.12)     Continue with current plan of care    Curtis [name] B. Curtis [name], [facility], CCC-SLP Speech Language Pathologist  Curtis [name] 05/20/2023, 10:37 AM

## 2023-05-20 NOTE — TOC Progression Note (Signed)
Transition of Care [facility]) - Progression Note    Patient Details  Name: Curtis [name] MRN: [id number] Date of Birth: 02-Apr-1956  [facility] CM/SW Contact  Truddie Hidden, RN Phone Number: 05/20/2023, 3:14 PM  Clinical Narrative:    Spoke with patient's Sister, Pattricia Boss. She was provided bed offers for [facility], and [facility]. Pattricia Boss is agreeable to [facility]. She was advised auth would be started.   3:23pm Auth approved Plan auth ID# [id number] Approved 2/3-2/5. MD notified.    Expected Discharge Plan: Skilled Nursing Facility Barriers to Discharge: Continued Medical Work up  Expected Discharge Plan and Services In-house Referral: Clinical Social Work   Post Acute Care Choice: Skilled Nursing Facility Living arrangements for the past 2 months: Single Family Home                                       Social Determinants of Health (SDOH) Interventions SDOH Screenings   Tobacco Use: High Risk (04/25/2023)    Readmission Risk Interventions     No data to display

## 2023-05-20 NOTE — Care Management Important Message (Signed)
Important Message  Patient Details  Name: Curtis [name] MRN: [id number] Date of Birth: Aug 04, 1955   Important Message Given:  Yes - Medicare IM     Sherilyn Banker 05/20/2023, 2:11 PM

## 2023-05-20 NOTE — Progress Notes (Signed)
Physical Therapy Treatment Patient Details Name: Lafayette Dunlevy MRN: [id number] DOB: 10/29/1955 Today's Date: 05/20/2023   History of Present Illness Patient is a 68 year old male found unresponsive, hypothermic, acute hypoxic respiratory failure with intubation required, extubated 05/07/23, re- intubated, and extubated again 05/13/23. History of inguinal hernia s/p repair, SBO, alcohol withdrawal seizure, EtOH abuse.    PT Comments  Patient is agreeable to PT. He is oriented to self and place. The patient continues to require physical assistance with bed mobility and transfers. Sitting balance is improved since last session without trunk sway/rocking noted. The patient was able to fully stand with assistance today. Standing tolerance is limited for progression of ambulation. Recommend to continue PT to maximize independence with rehabilitation < 3 hours/day after this hospital stay.    If plan is discharge home, recommend the following: Two people to help with walking and/or transfers;Two people to help with bathing/dressing/bathroom;Assistance with cooking/housework;Help with stairs or ramp for entrance;Supervision due to cognitive status   Can travel by private vehicle     No  Equipment Recommendations   (to be determined at next level of care)    Recommendations for Other Services       Precautions / Restrictions Precautions Precautions: Fall Restrictions Weight Bearing Restrictions Per Provider Order: No     Mobility  Bed Mobility Overal bed mobility: Needs Assistance Bed Mobility: Supine to Sit, Sit to Supine     Supine to sit: Max assist Sit to supine: Max assist   General bed mobility comments: assistance for trunk and BLE support. verbal cues for sequencing and task initiation    Transfers Overall transfer level: Needs assistance Equipment used: Rolling walker (2 wheels) Transfers: Sit to/from Stand, Bed to chair/wheelchair/BSC Sit to Stand: Max assist           Lateral/Scoot Transfers: Min assist General transfer comment: standing performed x 1 bout using rolling walker with cues for technique. patient required Min a for scooting to the left x 3 bouts. cues for technique    Ambulation/Gait               General Gait Details: unable to stand long enough to safely progress ambluation at this time   Stairs             Wheelchair Mobility     Tilt Bed    Modified Rankin (Stroke Patients Only)       Balance Overall balance assessment: Needs assistance Sitting-balance support: Feet supported Sitting balance-Leahy Scale: Fair Sitting balance - Comments: sitting balance improved this session with no trunk sway/rocking noted. clost stand by assistance for safety   Standing balance support: Bilateral upper extremity supported Standing balance-Leahy Scale: Poor Standing balance comment: very wide base of support with standing. external support required to maintain standing balance for a few seconds before requesting to sit                            Cognition Arousal: Alert Behavior During Therapy: WFL for tasks assessed/performed Overall Cognitive Status: Impaired/Different from baseline Area of Impairment: Following commands, Orientation, Memory, Safety/judgement, Problem solving                 Orientation Level: Disoriented to, Situation, Time   Memory: Decreased short-term memory Following Commands: Follows one step commands with increased time Safety/Judgement: Decreased awareness of deficits, Decreased awareness of safety   Problem Solving: Slow processing, Decreased initiation, Difficulty sequencing, Requires verbal cues  Exercises      General Comments General comments (skin integrity, edema, etc.): activity tolerance limtied by fatigue and generalized weakness      Pertinent Vitals/Pain Pain Assessment Pain Assessment: Faces Faces Pain Scale: Hurts whole lot Pain Location:  lower back Pain Descriptors / Indicators: Discomfort, Grimacing Pain Intervention(s): Monitored during session, Limited activity within patient's tolerance, Repositioned    Home Living                          Prior Function            PT Goals (current goals can now be found in the care plan section) Acute Rehab PT Goals Patient Stated Goal: to go home PT Goal Formulation: With patient Time For Goal Achievement: 05/30/23 Potential to Achieve Goals: Good Progress towards PT goals: Progressing toward goals    Frequency    Min 1X/week      PT Plan      Co-evaluation              AM-PAC PT "6 Clicks" Mobility   Outcome Measure  Help needed turning from your back to your side while in a flat bed without using bedrails?: A Lot Help needed moving from lying on your back to sitting on the side of a flat bed without using bedrails?: A Lot Help needed moving to and from a bed to a chair (including a wheelchair)?: A Lot Help needed standing up from a chair using your arms (e.g., wheelchair or bedside chair)?: A Lot Help needed to walk in hospital room?: Total Help needed climbing 3-5 steps with a railing? : Total 6 Click Score: 10    End of Session Equipment Utilized During Treatment: Gait belt Activity Tolerance: Patient limited by fatigue Patient left: in bed;with call bell/phone within reach;with bed alarm set Nurse Communication: Mobility status PT Visit Diagnosis: Muscle weakness (generalized) (M62.81);Unsteadiness on feet (R26.81)     Time: 1610-9604 PT Time Calculation (min) (ACUTE ONLY): 15 min  Charges:    $Therapeutic Activity: 8-22 mins PT General Charges $$ ACUTE PT VISIT: 1 Visit                     Donna Bernard, PT, MPT    Ina Homes 05/20/2023, 10:52 AM

## 2023-05-20 NOTE — Progress Notes (Signed)
[facility] Progress Note  Patient: Curtis [name]    ZOX:[id number]  DOA: 04/25/2023     Date of Service: the patient was seen and examined on 05/20/2023  Chief Complaint  Patient presents with   unresponsive   Brief hospital course: 68 year old male patient with a past medical history of incarcerated inguinal hernia s/p repair, SBO, alcohol withdrawal seizures, EtOH abuse who presented to [facility] on 04/24/2022 for unresponsiveness. He was found to be profoundly hypothermic with temperature measuring 77 F. Subsequently was intubated on 04/24/2022. He was actively rewarmed and started on stress dose steroids. Currently normothermic. Course also complicated by rhabdomyolysis with CK peaking at 6000 and currently downtrending. Finally course complicated by acute hypoxic respiratory failure suspecting aspiration for which she is on Zosyn and doxycycline. Tracheal aspirate w/ Moraxella Cat Patient obstacle to extubation is mental status remains agitated with SAT.   Per ED report, EMS was dispatched to patient's residence by a neighbor who found the patient unconscious.  On EMS arrival, patient was found unresponsive on floor of bedroom. Neighbor report LKNW was 3 days ago. Patient was initially combative and would tense resist any treatment. EMS report they were unable to obtain a temperature or spo2 due to hypothermia and unable to obtain bp due to combative resistance. patient initial cbg registered at 56 treated with 1mg  glucagon repeat reading were in the upper 20's   ED Course: Initial vital signs showed HR of 55 beats/minute, BP 99/47 mm Hg, the RR 22 breaths/minute, and the oxygen saturation 100% on NRB and a temperature of 4F (25C). Patient intubated in for airway protection. Pertinent Labs/Diagnostics Findings: Na+/ K+:131/4.8  Glucose: 286 BUN/Cr.: 31/1.91 CO2 1, Anion Gap 23, AST/ALT:284/83 WBC: 15.7K/L with neutrophil predominance  Lactic acid: >9.0 COVID PCR: Negative,  troponin: 32   CK 5249 ETOH:177 VBG: pO2 46.2; pCO2 72; pH 6.96;  HCO3 16.2, %O2 Sat 46.6.  CXR> CTH> CTA Chest> CT Abd/pelvis>see results below Medication administered in the WJ:XBJYNWG given 30 cc/kg of fluids and started on broad-spectrum antibiotics with Ceftriaxone for suspected sepsis with septic shock.    05/15/23- patient on diet without aspiration events.  Nonfebrile. Had episode of Afrvr overnight, now rate controlled on amio.  He does have few electrolyte abnormalities with repletion ongoing. He seems to have development of alcohol withdrawal.  Rectal tube active.  Weaned to 6L/min [place].  Starting beta blockade for af.   05/16/23- patient with signs of Tardive dykinesia, have discussed with phamacist regarding therapy.  He is less anxious/aggitated with librium taper for EtOH withdrawal. AF is rate controlled.  Switching to po prednisone and eliquis . 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   Micro Data:  1/9: SARS-CoV-2/RSV/influenza PCR>> negative 1/9: HIV screen>> nonreactive 1/9: Blood culture x 2>> no growth 1/9: MRSA PCR>> negative 1/10: Respiratory viral panel>> negative 1/10: Mycoplasma pneumoniae IgM>> less than 770 1/10: Strep pneumo and Legionella urinary antigens>> negative 1/10: Tracheal aspirate>> Proteus mirabilis, Moraxella Catarrhalis (beta lactamase +) 1/11: Acute viral hepatitis panel>> nonreactive 1/13: Blood culture x 2>> no growth 1/22: RVP>> negative  Significant Hospital Events:  04/24/2022 - Intubated, admit to ICU extremely hypotensive.  04/28/2022 - Started on Librium taper and Seroquel BID.  04/29/2022 - Remains with significant agitation on SAT and unable to proceed with extubation.  04/30/2022 Mental status with significant improvement. Following commands. Hgb trending down now at 7.3g/dl. Stools brown. NG tube to suction and no signs  of bleeding.  05/01/2022 1pRBC given. Improved mental  status.  05/02/2022 Patient H&H stable. Did well on SBT however mentation remains extremley poor with significant thick secretions therefore held off on extubation.  05/03/2022 Remains with poor mental status, not doing well with SBT. My concern is that we are looking at a trach if no improvement in mental status in the next 48hrs.  05/05/2023: Initially tolerated WUA and SBT, however with BM and turning, agitated and severe hypoxia. 05/06/2023: On minimal vent settings, FAILED SBT.  Increase Seroquel to help with agitation.  Decrease free water flushes to 200 q4h. 05/07/2023: On minimal vent settings, perform SBT as tolerated. Diurese with 60 mg IV Lasix x1 dose.  EXTUBATED. 05/08/2023: Pt failed extubation due to worsening acute respiratory failure secondary to excessive secretions requiring reintubation. Pt febrile with worsening leukocytosis, unasyn started for aspiration pneumonia  05/09/2023: Pt remains mechanically intubated FiO2 55%/PEEP 5.  Currently sedated with precedex and low dose fentanyl gtt.  Pt bradycardic will discontinue precedex gtt and restart propofol gtt 05/10/2023: On 50% FiO2.  Diurese with 40 mg IV Lasix x1 dose.  Triglycerides 702, d/c Propofol and change to Dilaudid, Precedex with prn versed pushes.  Developed A.fib with RVR, converted back to NSR with Amiodarone bous. 05/11/2023: Increased WOB on WUA. 05/12/2023: On minimal vent settings, plan for WUA and SBT when family arrives.  Will diurese with 40 mg IV Lasix x1 dose.   05/13/23- for SBT today, opening eyes but not consistently following verbal communication.  Family conference today.  Patient re-evaluated during SBT with passing parameters and liberated from MV.  Extubated 05/13/23 - Met with family reviewed overall comorbid and poor long term prognosis.  05/14/23- patient had slp with diet ordered, behavior is better mentation imrproved  Assessment and Plan: # Acute metabolic encephalopathy - Avoid sedating medications  as able - Daily wake up assessment - Continue thiamine, MVI, folic acid due to alcoholism hx   # Septic shock ~ RESOLVED # Intermittent atrial fibrillation with rvr ~ CONVERTED TO NSR # Elevated Troponin, suspect demand ischemia Echocardiogram 04/26/23: LVEF >55%, diastolic function unable to be evaluated, RV systolic function mildly reduced, RV size is normal -Continuous cardiac monitoring -Maintain MAP >65 -Vasopressors as needed to maintain MAP goal -Diuresis as BP and renal function permits - Prn metoprolol for sustained hr >120 bpm - Troponin peaked at 885  -TSH normal at 1.2 on 04/26/23   # Acute hypoxic respiratory failure, Resolved  Extubated on 05/13/23 On 2/2 noticed worsening of shortness of breath and COPD exacerbation Patient is already on tapering dose of prednisone Started Brovana and budesonide nebulizer twice daily, DuoNeb every 6 hourly scheduled, it can be transition to as needed after improvement Continue supplemental O2 inhalation and gradually wean off   # Aspiration pneumonia ((Trach aspirate with Moraxella Ctarrhalis and proteus Mirabilis, gram stain w/ Gram + Cocci ) ~ TREATED -Monitor fever curve -Trend WBC's & Procalcitonin -Follow cultures as above -s/p Zosyn as per sensitivity report, completed course   # A-fib with RVR CHA2DS2/VAS Stroke Risk Points=2  Rate controlled now Continue Lopressor 50 mg p.o. twice daily Started Eliquis 5 mg p.o. twice daily 2/1 repeat EKG to check rhythm Cardiology consulted, recommended to continue Lopressor and Eliquis and follow-up as an outpatient, signed off.   # Hypertension Continue Lopressor 50 mg p.o. twice daily Started amlodipine 5 mg p.o. daily Monitor BP and titrate medications accordingly   # Alcohol withdrawal syndrome- resolved  On librium taper .  S/p IV ativan 1mg  today    # AKI~Resolved  # Hypernatremia~Resolved # Hypokalemia, Resolved  - Trend BMP  - Replace electrolytes as indicated  - Strict  I&O's  - Continue free water flush to 200 ml q4hrs  - Replace electrolytes as indicated~pharmacy following for assistance with electrolyte replacement   # Anemia without obvious signs of bleeding  CT Abd/Pelvis 05/02/2023: without any sign of intraperitoneal or retroperitoneal hematoma - Trend CBC - VTE px: subcutaneous lovenox  - Monitor for s/sx of bleeding  - Transfuse for Hgb <7 - GI consulted, appreciate input: No indication for EGD currently, continue PPI BID for SUP, if further drop in Hgb recommends Tagged RBC scan 2/1 Hb 7.7, repeated 9.8, possible error in a.m. labs   # Hyperglycemia  # Hypoglycemia~resolved   - CBG's q4hrs  - Sensitive SSI  - Follow hyper/hypoglycemic protocol   # Hypomagnesemia, mag repleted. Monitor electrolytes and replete as needed.   Body mass index is 24.65 kg/m.  Nutrition Problem: Inadequate oral intake Etiology: inability to eat (pt sedated and ventilated) Interventions: Interventions: Tube feeding  Pressure Injury 05/07/23 Buttocks Right Stage 2 -  Partial thickness loss of dermis presenting as a shallow open injury with a red, pink wound bed without slough. (Active)  05/07/23 1139  Location: Buttocks  Location Orientation: Right  Staging: Stage 2 -  Partial thickness loss of dermis presenting as a shallow open injury with a red, pink wound bed without slough.  Wound Description (Comments):   Present on Admission:   Dressing Type Foam - Lift dressing to assess site every shift 05/20/23 0800     Pressure Injury 05/07/23 Anus Mid Stage 2 -  Partial thickness loss of dermis presenting as a shallow open injury with a red, pink wound bed without slough. (Active)  05/07/23 1810  Location: Anus  Location Orientation: Mid  Staging: Stage 2 -  Partial thickness loss of dermis presenting as a shallow open injury with a red, pink wound bed without slough.  Wound Description (Comments):   Present on Admission:   Dressing Type Foam - Lift  dressing to assess site every shift 05/20/23 0800     Diet: Dysphagia 1 diet DVT Prophylaxis: Eliquis  Advance goals of care discussion: DNR/DNI-limited  Family Communication: family was not present at bedside, at the time of interview.  The pt provided permission to discuss medical plan with the family. Opportunity was given to ask question and all questions were answered satisfactorily.   Disposition:  Pt is from Home, admitted with EtOH withdrawal, respiratory failure, pneumonia, was admitted in the ICU, intubated.  Stabilized and downgraded under hospital service.  Patient still has altered mental status. which precludes a safe discharge. Discharge to SNF, when stable, may need few more days to improve.  Subjective: No significant events overnight, patient is AO x 2 to 3, slightly confused, mental status is gradually improving. Shortness of breath improved, currently patient is saturating well on room air. Patient was resting calmly, denied any complaints.   Physical Exam: General: Patient was lying comfortably, AAO x 2-3 mild confusion Appear in no distress, affect appropriate Eyes: PERRLA ENT: Oral Mucosa Clear, moist  Neck: no JVD,  Cardiovascular: S1 and S2 Present, no Murmur,  Respiratory: Equal air entry bilaterally, no crackles and no wheezes  Abdomen: Bowel Sound present, Soft and no tenderness,  Skin: no rashes Extremities: no Pedal edema, no calf tenderness Neurologic: without any new focal findings Gait not checked due to patient safety  concerns  Vitals:   05/20/23 0700 05/20/23 0723 05/20/23 0933 05/20/23 1226  BP:   (!) 146/62 (!) 140/62  Pulse:   83 63  Resp: 17  18   Temp:   98 F (36.7 C) 98.5 F (36.9 C)  TempSrc:   Oral Oral  SpO2:  97% 96% 99%  Weight:      Height:        Intake/Output Summary (Last 24 hours) at 05/20/2023 1511 Last data filed at 05/19/2023 2000 Gross per 24 hour  Intake 1570.86 ml  Output 400 ml  Net 1170.86 ml   Filed  Weights   05/18/23 0500 05/19/23 0649 05/20/23 0500  Weight: 72.5 kg 71.9 kg 71.4 kg    Data Reviewed: I have personally reviewed and interpreted daily labs, tele strips, imagings as discussed above. I reviewed all nursing notes, pharmacy notes, vitals, pertinent old records I have discussed plan of care as described above with RN and patient/family.  CBC: Recent Labs  Lab 05/16/23 0427 05/17/23 0500 05/18/23 0428 05/18/23 1227 05/19/23 0944 05/20/23 0520  WBC 9.9 11.2* 8.8  --  12.9* 9.1  HGB 8.7* 9.5* 7.7* 9.8* 9.2* 9.2*  HCT 27.7* 29.7* 24.9* 31.2* 29.1* 29.2*  MCV 102.2* 101.4* 103.8*  --  102.1* 100.7*  PLT 316 349 287  --  377 348   Basic Metabolic Panel: Recent Labs  Lab 05/16/23 0427 05/16/23 0636 05/17/23 0500 05/18/23 0428 05/19/23 0944 05/20/23 0520 05/20/23 1012  NA 146*  --  148* 146* 141 143  --   K 3.2*  --  3.4* 3.0* 3.5 3.9  --   CL 109  --  112* 117* 108 108  --   CO2 24  --  24 20* 22 25  --   GLUCOSE 102*  --  115* 104* 121* 94  --   BUN 13  --  11 11 13 12   --   CREATININE 0.84  --  0.80 0.70 0.81 0.84  --   CALCIUM 9.2  --  9.0 7.8* 9.1 9.3  --   MG 2.0  --  1.9 1.4* 1.5*  --  1.8  PHOS  --  2.2* 3.3 2.6 2.7  --  3.5    Studies: No results found.  Scheduled Meds:  amLODipine  5 mg Oral Daily   apixaban  5 mg Oral BID   arformoterol  15 mcg Nebulization BID   vitamin C  500 mg Oral BID   budesonide (PULMICORT) nebulizer solution  0.25 mg Nebulization BID   Chlorhexidine Gluconate Cloth  6 each Topical Nightly   feeding supplement (NEPRO CARB STEADY)  237 mL Oral TID BM   folic acid  1 mg Oral Daily   insulin aspart  0-9 Units Subcutaneous TID AC & HS   ipratropium-albuterol  3 mL Nebulization BID   loratadine  10 mg Oral Daily   metoprolol tartrate  50 mg Oral BID   multivitamin with minerals  1 tablet Oral Daily   nicotine  21 mg Transdermal Daily   mouth rinse  15 mL Mouth Rinse 4 times per day   pantoprazole  40 mg Oral Daily    [START ON 05/21/2023] predniSONE  30 mg Oral Q breakfast   Followed by   Melene Muller ON 05/22/2023] predniSONE  25 mg Oral Q breakfast   Followed by   Melene Muller ON 05/23/2023] predniSONE  20 mg Oral Q breakfast   Followed by   Melene Muller ON 05/24/2023] predniSONE  15 mg Oral Q breakfast   Followed by   Melene Muller ON 05/25/2023] predniSONE  10 mg Oral Q breakfast   Followed by   Melene Muller ON 05/26/2023] predniSONE  5 mg Oral Q breakfast   QUEtiapine  25 mg Oral QPM   sodium chloride flush  10-40 mL Intracatheter Q12H   thiamine  100 mg Oral Daily   Continuous Infusions:    PRN Meds: acetaminophen, acetaminophen, guaiFENesin, haloperidol lactate, hydrALAZINE, labetalol, metoprolol tartrate, ondansetron (ZOFRAN) IV, mouth rinse, polyethylene glycol, sodium chloride flush  Time spent: 40 minutes  Author: Gillis Santa. MD [facility] 05/20/2023 3:11 PM  To reach On-call, see care teams to locate the attending and reach out to them via [email or web address]. If 7PM-7AM, please contact night-coverage If you still have difficulty reaching the attending provider, please page the [facility] (Director on Call) for [facility] on amion for assistance.

## 2023-05-20 NOTE — Progress Notes (Signed)
2/3 Patient unable to understand or sign the IMM Letter. I spoke to patient's aunt Pattricia Boss) @[phone] via telephone and explained the consent of the IMM Letter and received verbal consent and acknowledgement.

## 2023-05-21 DIAGNOSIS — G9341 Metabolic encephalopathy: Secondary | ICD-10-CM | POA: Diagnosis not present

## 2023-05-21 LAB — BASIC METABOLIC PANEL
Anion gap: 8 (ref 5–15)
BUN: 12 mg/dL (ref 8–23)
CO2: 24 mmol/L (ref 22–32)
Calcium: 8.7 mg/dL — ABNORMAL LOW (ref 8.9–10.3)
Chloride: 104 mmol/L (ref 98–111)
Creatinine, Ser: 0.84 mg/dL (ref 0.61–1.24)
GFR, Estimated: >60 mL/min (ref >60)
Glucose, Bld: 106 mg/dL — ABNORMAL HIGH (ref 70–99)
Potassium: 3.5 mmol/L (ref 3.5–5.1)
Sodium: 136 mmol/L (ref 135–145)

## 2023-05-21 LAB — CBC
HCT: 30.2 % — ABNORMAL LOW (ref 39.0–52.0)
Hemoglobin: 9.9 g/dL — ABNORMAL LOW (ref 13.0–17.0)
MCH: 32.4 pg (ref 26.0–34.0)
MCHC: 32.8 g/dL (ref 30.0–36.0)
MCV: 98.7 fL (ref 80.0–100.0)
Platelets: 309 10*3/uL (ref 150–400)
RBC: 3.06 MIL/uL — ABNORMAL LOW (ref 4.22–5.81)
RDW: 14.9 % (ref 11.5–15.5)
WBC: 10 10*3/uL (ref 4.0–10.5)
nRBC: 0 % (ref 0.0–0.2)

## 2023-05-21 LAB — PHOSPHORUS: Phosphorus: 3.3 mg/dL (ref 2.5–4.6)

## 2023-05-21 LAB — GLUCOSE, CAPILLARY
Glucose-Capillary: 86 mg/dL (ref 70–99)
Glucose-Capillary: 93 mg/dL (ref 70–99)
Glucose-Capillary: 95 mg/dL (ref 70–99)
Glucose-Capillary: 154 mg/dL — ABNORMAL HIGH (ref 70–99)
Glucose-Capillary: 191 mg/dL — ABNORMAL HIGH (ref 70–99)

## 2023-05-21 LAB — MAGNESIUM: Magnesium: 1.5 mg/dL — ABNORMAL LOW (ref 1.7–2.4)

## 2023-05-21 MED ORDER — METOPROLOL TARTRATE 50 MG PO TABS: 50.0000 mg | ORAL_TABLET | Freq: Two times a day (BID) | ORAL | Status: AC

## 2023-05-21 MED ORDER — QUETIAPINE FUMARATE 25 MG PO TABS: 25.0000 mg | ORAL_TABLET | Freq: Every evening | ORAL | Status: AC

## 2023-05-21 MED ORDER — ENSURE ENLIVE PO LIQD
237.0000 mL | Freq: Three times a day (TID) | ORAL | Status: DC
  Administered 2023-05-21: 237 mL via ORAL

## 2023-05-21 MED ORDER — PANTOPRAZOLE SODIUM 40 MG PO TBEC: 40.0000 mg | DELAYED_RELEASE_TABLET | Freq: Every day | ORAL | Status: AC

## 2023-05-21 MED ORDER — FLUTICASONE FUROATE-VILANTEROL 200-25 MCG/ACT IN AEPB: 1.0000 | INHALATION_SPRAY | Freq: Every day | RESPIRATORY_TRACT | Status: AC

## 2023-05-21 MED ORDER — ADULT MULTIVITAMIN W/MINERALS CH: 1.0000 | ORAL_TABLET | Freq: Every day | ORAL | Status: AC

## 2023-05-21 MED ORDER — MAGNESIUM SULFATE 4 GM/100ML IV SOLN
4.0000 g | Freq: Once | INTRAVENOUS | Status: AC
Start: 2023-05-21 — End: 2023-05-21
  Administered 2023-05-21: 4 g via INTRAVENOUS
  Filled 2023-05-21: qty 100

## 2023-05-21 MED ORDER — POLYETHYLENE GLYCOL 3350 17 G PO PACK: 17.0000 g | PACK | Freq: Every day | ORAL | Status: AC | PRN

## 2023-05-21 MED ORDER — POTASSIUM CHLORIDE CRYS ER 20 MEQ PO TBCR
40.0000 meq | EXTENDED_RELEASE_TABLET | Freq: Once | ORAL | Status: AC
  Administered 2023-05-21: 40 meq via ORAL
  Filled 2023-05-21: qty 2

## 2023-05-21 MED ORDER — APIXABAN 5 MG PO TABS: 5.0000 mg | ORAL_TABLET | Freq: Two times a day (BID) | ORAL | Status: AC

## 2023-05-21 MED ORDER — VITAMIN B-1 100 MG PO TABS: 100.0000 mg | ORAL_TABLET | Freq: Every day | ORAL | Status: AC

## 2023-05-21 MED ORDER — AMLODIPINE BESYLATE 5 MG PO TABS: 5.0000 mg | ORAL_TABLET | Freq: Every day | ORAL | Status: DC

## 2023-05-21 MED ORDER — ALBUTEROL SULFATE HFA 108 (90 BASE) MCG/ACT IN AERS: 2.0000 | INHALATION_SPRAY | Freq: Four times a day (QID) | RESPIRATORY_TRACT | Status: AC | PRN

## 2023-05-21 MED ORDER — MAGNESIUM OXIDE 400 MG PO CAPS: 400.0000 mg | ORAL_CAPSULE | Freq: Two times a day (BID) | ORAL | Status: AC

## 2023-05-21 NOTE — Progress Notes (Signed)
 Physical Therapy Treatment Patient Details Name: Curtis [name] MRN: [id number] DOB: [birth year] Today's Date: 05/21/2023   History of Present Illness Patient is a 68 year old male found unresponsive, hypothermic, acute hypoxic respiratory failure with intubation required, extubated 05/07/23, re- intubated, and extubated again 05/13/23. History of inguinal hernia s/p repair, SBO, alcohol withdrawal seizure, EtOH abuse.    PT Comments  Patient is agreeable to PT session. He is confused but cooperative and able to follow single step commands with increased time. Patient able to stand x 2 bouts with Max A. Poor standing tolerance and poor standing balance. Recommend to continue PT to maximize independence and facilitate return to prior level of function. Rehabilitation < 3 hours/day recommended after this hospital stay.    If plan is discharge home, recommend the following: Two people to help with walking and/or transfers;Two people to help with bathing/dressing/bathroom;Assistance with cooking/housework;Help with stairs or ramp for entrance;Supervision due to cognitive status   Can travel by private vehicle     No  Equipment Recommendations   (to be determined at next level of care)    Recommendations for Other Services       Precautions / Restrictions Precautions Precautions: Fall Restrictions Weight Bearing Restrictions Per Provider Order: No     Mobility  Bed Mobility Overal bed mobility: Needs Assistance Bed Mobility: Supine to Sit, Sit to Supine     Supine to sit: Max assist Sit to supine: Max assist   General bed mobility comments: assistance for trunk and BLE support. cues for technique    Transfers Overall transfer level: Needs assistance Equipment used: Rolling walker (2 wheels) Transfers: Sit to/from Stand, Bed to chair/wheelchair/BSC Sit to Stand: Max assist           General transfer comment: 2 standing bouts performed. lifting and lowering assistance required     Ambulation/Gait             Pre-gait activities: patient able to take 2 smalls steps to the right with Max A. standing tolerance limited for progression of walking     Stairs             Wheelchair Mobility     Tilt Bed    Modified Rankin (Stroke Patients Only)       Balance Overall balance assessment: Needs assistance Sitting-balance support: Feet supported Sitting balance-Leahy Scale: Fair     Standing balance support: Bilateral upper extremity supported Standing balance-Leahy Scale: Poor Standing balance comment: external support required                            Cognition Arousal: Alert Behavior During Therapy: WFL for tasks assessed/performed Overall Cognitive Status: Impaired/Different from baseline Area of Impairment: Following commands, Orientation, Memory, Safety/judgement, Problem solving                 Orientation Level: Disoriented to, Situation, Time, Place   Memory: Decreased short-term memory Following Commands: Follows one step commands with increased time Safety/Judgement: Decreased awareness of deficits, Decreased awareness of safety   Problem Solving: Slow processing, Decreased initiation, Difficulty sequencing, Requires verbal cues          Exercises      General Comments        Pertinent Vitals/Pain Pain Assessment Pain Assessment: Faces Faces Pain Scale: Hurts a little bit Pain Location: lower back Pain Descriptors / Indicators: Discomfort, Grimacing, Sore Pain Intervention(s): Limited activity within patient's tolerance, Monitored during session, Repositioned  Home Living                          Prior Function            PT Goals (current goals can now be found in the care plan section) Acute Rehab PT Goals Patient Stated Goal: to go home PT Goal Formulation: With patient Time For Goal Achievement: 05/30/23 Potential to Achieve Goals: Good Progress towards PT goals:  Progressing toward goals    Frequency    Min 1X/week      PT Plan      Co-evaluation              AM-PAC PT 6 Clicks Mobility   Outcome Measure  Help needed turning from your back to your side while in a flat bed without using bedrails?: A Lot Help needed moving from lying on your back to sitting on the side of a flat bed without using bedrails?: A Lot Help needed moving to and from a bed to a chair (including a wheelchair)?: A Lot Help needed standing up from a chair using your arms (e.g., wheelchair or bedside chair)?: A Lot Help needed to walk in hospital room?: Total Help needed climbing 3-5 steps with a railing? : Total 6 Click Score: 10    End of Session   Activity Tolerance: Patient limited by fatigue Patient left: in bed;with call bell/phone within reach;with bed alarm set   PT Visit Diagnosis: Muscle weakness (generalized) (M62.81);Unsteadiness on feet (R26.81)     Time: 1056-1110 PT Time Calculation (min) (ACUTE ONLY): 14 min  Charges:    $Therapeutic Activity: 8-22 mins PT General Charges $$ ACUTE PT VISIT: 1 Visit                     Randine Essex, PT, MPT    Randine LULLA Essex 05/21/2023, 1:03 PM

## 2023-05-21 NOTE — TOC Transition Note (Signed)
 Transition of Care [facility]) - Discharge Note   Patient Details  Name: Curtis [name] MRN: [id number] Date of Birth: [birth year]  Transition of Care [facility]) CM/SW Contact:  Lauraine JAYSON Carpen, LCSW Phone Number: 05/21/2023, 4:45 PM   Clinical Narrative:  Patient has orders to discharge to [facility] today. RN will call report to [phone] (Room 708). EMS transport has been arranged and he is 3rd on the list. No further concerns. CSW signing off.   Final next level of care: Skilled Nursing Facility Barriers to Discharge: Continued Medical Work up   Patient Goals and CMS Choice   CMS Medicare.gov Compare Post Acute Care list provided to:: Patient Represenative (must comment) Choice offered to / list presented to : Patient (Family)      Discharge Placement PASRR number recieved: 05/18/23            Patient chooses bed at: [facility]  Patient to be transferred to facility by: EMS Name of family member notified: Zelda Chuck Patient and family notified of of transfer: 05/21/23  Discharge Plan and Services Additional resources added to the After Visit Summary for   In-house Referral: Clinical Social Work   Post Acute Care Choice: Skilled Nursing Facility                               Social Drivers of Health (SDOH) Interventions SDOH Screenings   Tobacco Use: High Risk (04/25/2023)     Readmission Risk Interventions     No data to display

## 2023-05-21 NOTE — Progress Notes (Signed)
 Speech Language Pathology Treatment: Dysphagia  Patient Details Name: Curtis [name] MRN: [id number] DOB: Jun 22, 1955 Today's Date: 05/21/2023 Time: 9074-9059 SLP Time Calculation (min) (ACUTE ONLY): 15 min  Assessment / Plan / Recommendation Clinical Impression  Pt seen at bedside for skilled ST intervention targeting goals for tolerance of least restrictive diet. Pt seen at breakfast of pureed solids and thin liquids. Extended oral prep of purees, with mild oral residue noted.  Liquid wash effectively cleared residue. Recommend continuing current diet (puree/thin) for energy conservation. Safe swallow precautions posted at [facility]. Lungs with Equal air entry bilaterally, no crackles and no wheezes. Pt is afebrile, WBC WNL. ST signing off at this time. Please reconsult if needs arise.    HPI HPI: Pt is a 68 y.o male with significant PMH of incarcerated inguinal hernia s/p repair, SBO, alcohol withdrawal seizure, EtOH abuse who presented to the ED with chief complaints of unresponsiveness.  Pt admitted w/ dxs including: Acute Metabolic Encephalopathy In the setting of severe metabolic acidosis, severe hypothermia and hypoglycemia  #Hx of seizure-likely EtOH withdrawal seizures; Multifocal Pneumonia; Acute Hypoxic Respiratory Failure.  Per ED report, EMS was dispatched to patient's residence by a neighbor who found the patient unconscious.  On EMS arrival, patient was found unresponsive on floor of bedroom. Neighbor report LKNW was 3 days ago.  Pt was intubated, admit to ICU extremely hypotensive; hypothermia.   CXR at admit: negative for acute issues.  CXR on 1/25: Bibasilar collapse/consolidation, right greater than left with  small right and tiny left pleural effusions.   During this admit, pt was orally intubated at admit on 04/25/23; extubated on 05/13/2023 with failed trial of extubation on 05/07/23.      SLP Plan  Discharge SLP treatment due to goals met      Recommendations for follow up therapy are  one component of a multi-disciplinary discharge planning process, led by the attending physician.  Recommendations may be updated based on patient status, additional functional criteria and insurance authorization.    Recommendations  Diet recommendations: Dysphagia 1 (puree);Thin liquid Liquids provided via: Straw;Cup Medication Administration: Crushed with puree Supervision: Staff to assist with self feeding;Full supervision/cueing for compensatory strategies Compensations: Minimize environmental distractions;Slow rate;Small sips/bites;Lingual sweep for clearance of pocketing;Follow solids with liquid Postural Changes and/or Swallow Maneuvers: Out of bed for meals;Seated upright 90 degrees;Upright 30-60 min after meal                  Oral care before and after PO;Staff/trained caregiver to provide oral care   Frequent or constant Supervision/Assistance Dysphagia, oropharyngeal phase (R13.12)     Discharge SLP treatment due to goals met    Marrian Bells B. Dory, MSP, CCC-SLP Speech Language Pathologist  Dory Caprice Daring 05/21/2023, 9:48 AM

## 2023-05-21 NOTE — Progress Notes (Signed)
 Occupational Therapy Treatment Patient Details Name: Curtis [name] MRN: [id number] DOB: September 15, 1955 Today's Date: 05/21/2023   History of present illness Patient is a 68 year old male found unresponsive, hypothermic, acute hypoxic respiratory failure with intubation required, extubated 05/07/23, re- intubated, and extubated again 05/13/23. History of inguinal hernia s/p repair, SBO, alcohol withdrawal seizure, EtOH abuse.   OT comments  Pt is supine in bed on arrival. Lethargic, but alert once seated at EOB. He did not report any pain to therapist. Pt performed bed mobility with Mod/Max A for trunk management as BLEs were already hanging off the bed. Pt able to reposition/scoot to EOB with CGA and max cueing. X1 STS performed to RW with Max/Mod A and cueing for hand placement with ~10 second tolerance before returning to EOB. SUP/SBA for seated balance and Min A needed to lateral scoot to [facility] and return to supine.  Lunch tray arrived and OT assisted pt with self feeding requiring max encouragement and Min A for safety/pacing. He was able to grasp cup and drink from it with SUP. Pt returned to bed with all needs in place and will cont to require skilled acute OT services to maximize his safety and IND to return to PLOF. He remains very weak with limited endurance throughout session.       If plan is discharge home, recommend the following:  Two people to help with walking and/or transfers;A lot of help with bathing/dressing/bathroom;Assistance with cooking/housework;Assist for transportation;Help with stairs or ramp for entrance;Direct supervision/assist for financial management;Supervision due to cognitive status;Direct supervision/assist for medications management   Equipment Recommendations  Other (comment) (defer to next venue)    Recommendations for Other Services      Precautions / Restrictions Precautions Precautions: Fall Restrictions Weight Bearing Restrictions Per Provider Order: No        Mobility Bed Mobility Overal bed mobility: Needs Assistance Bed Mobility: Supine to Sit, Sit to Supine     Supine to sit: Mod assist, Max assist Sit to supine: Min assist   General bed mobility comments: Max/Mod A for trunk management to reach EOB; Min A for BLE management to return to supine    Transfers Overall transfer level: Needs assistance Equipment used: Rolling walker (2 wheels) Transfers: Sit to/from Stand Sit to Stand: Mod assist, Max assist, From elevated surface           General transfer comment: Mod/Max A x1 for STS from EOB for ~10 seconds before returnin to seated EOB reporting I'm too weak pt able to lateral scoot to [facility] with Min A and verb cues     Balance Overall balance assessment: Needs assistance Sitting-balance support: Feet supported Sitting balance-Leahy Scale: Fair Sitting balance - Comments: SUP for seated balance at EOB with ability to reposition with cueing Postural control: Posterior lean Standing balance support: Bilateral upper extremity supported Standing balance-Leahy Scale: Poor Standing balance comment: RW use and support from therapist to maintain standing balance                           ADL either performed or assessed with clinical judgement   ADL Overall ADL's : Needs assistance/impaired Eating/Feeding: Minimal assistance;Bed level Eating/Feeding Details (indicate cue type and reason): min A d/t max encouragement and cuein for taking smaller sips/bites with increased time; able to pick cup up from table and drink from it with SUP  Extremity/Trunk Assessment              Vision       Perception     Praxis      Cognition Arousal: Alert Behavior During Therapy: WFL for tasks assessed/performed Overall Cognitive Status: Impaired/Different from baseline Area of Impairment: Following commands, Orientation, Memory, Safety/judgement, Problem  solving                 Orientation Level: Disoriented to, Situation, Time, Place   Memory: Decreased short-term memory Following Commands: Follows one step commands with increased time Safety/Judgement: Decreased awareness of deficits, Decreased awareness of safety   Problem Solving: Slow processing, Decreased initiation, Difficulty sequencing, Requires verbal cues          Exercises      Shoulder Instructions       General Comments weak with limited endurance noted; not eating well    Pertinent Vitals/ Pain       Pain Assessment Pain Assessment: Faces Faces Pain Scale: No hurt Pain Intervention(s): Monitored during session  Home Living                                          Prior Functioning/Environment              Frequency  Min 1X/week        Progress Toward Goals  OT Goals(current goals can now be found in the care plan section)  Progress towards OT goals: Progressing toward goals  Acute Rehab OT Goals Patient Stated Goal: get stronger OT Goal Formulation: With patient Time For Goal Achievement: 05/30/23 Potential to Achieve Goals: Fair  Plan      Co-evaluation                 AM-PAC OT 6 Clicks Daily Activity     Outcome Measure   Help from another person eating meals?: A Little Help from another person taking care of personal grooming?: A Little Help from another person toileting, which includes using toliet, bedpan, or urinal?: A Lot Help from another person bathing (including washing, rinsing, drying)?: A Lot Help from another person to put on and taking off regular upper body clothing?: A Little Help from another person to put on and taking off regular lower body clothing?: A Lot 6 Click Score: 15    End of Session Equipment Utilized During Treatment: Rolling walker (2 wheels)  OT Visit Diagnosis: Other abnormalities of gait and mobility (R26.89);Muscle weakness (generalized) (M62.81)   Activity  Tolerance Patient tolerated treatment well   Patient Left in bed;with call bell/phone within reach;with bed alarm set;with nursing/sitter in room   Nurse Communication Mobility status        Time: 8656-8581 OT Time Calculation (min): 35 min  Charges: OT General Charges $OT Visit: 1 Visit OT Treatments $Self Care/Home Management : 8-22 mins $Therapeutic Activity: 8-22 mins  Maeby Vankleeck, OTR/L  05/21/23, 3:23 PM   Darnella Zeiter E Winifred Bodiford 05/21/2023, 3:19 PM

## 2023-05-21 NOTE — Progress Notes (Signed)
 Nutrition Follow-up  DOCUMENTATION CODES:   Severe malnutrition in context of acute illness/injury  INTERVENTION:   -D/c Nepro -Ensure Enlive po TID, each supplement provides 350 kcal and 20 grams of protein.  -Continue MVI with minerals daily -Continue 500 mg vitamin C  BID -Feeding assistance with meals -Magic cup TID with meals, each supplement provides 290 kcal and 9 grams of protein   NUTRITION DIAGNOSIS:   Severe Malnutrition related to acute illness as evidenced by mild fat depletion, moderate fat depletion, mild muscle depletion, moderate muscle depletion, percent weight loss.  Progressing; advanced to PO diet on 05/14/23  GOAL:   Patient will meet greater than or equal to 90% of their needs  Progressing   MONITOR:   PO intake, Supplement acceptance, Diet advancement  REASON FOR ASSESSMENT:   Consult Enteral/tube feeding initiation and management  ASSESSMENT:   68 y/o male with h/o SBO secondary to strangulated inguinal hernia s/p repair 2018, etoh abuse and seizures who is admitted with AMS, aspiration pneumonia, sepsis, AKI and  rhabdomyolysis.  1/17- s/p SBT- extubation held off secondary to poor mentation and significant thick secretions 1/19- rectal tube placed, per GI no signs of GIB 1/21- extubated 1/22- reintubated, unasyn  started for aspiration pneumonia 1/27- extubated 1/28- s/p BSE- advanced to dysphagia 1 diet with nectar thick liquids 1/30- s/p BSE- advanced to dysphagia 1 diet with thin liquids 2/1- rectal tube removed  Reviewed I/O's: -400 ml x 24 hours and -6.2 L since 05/07/23  UOP: 400 ml x 24 hours  Pt sleeping soundly at time of visit. No family at bedside. Pt awoke briefly during exam and was eager to engage RD in conversation. Pt reports feeling better today and only complaint is back pain when he moves around in the bed; he denies current pain. Per chart review, pt requires feeding assistance at meals.   Pt reports he is tolerating  diet well and reports improved appetite. Noted meal completions 25-75%. Noted that pt's liquids were advanced to thin liquids; pt is very appreciative of this. He reports that he is drinking the Nepro, but would prefer a chocolate flavored supplement. RD discussed that pt could have chocolate Ensure due to diet being liberalized. Discussed importance of good meal and supplement intake to promote healing. Pt amenable to plan and expressed appreciation for visit.   Reviewed wt history; noted pt has experienced a 11.6% wt loss over the past week and a 11.9% wt loss since admission. Pt also -6.9 L since 05/07/23. Suspect edema may have been masking true weight loss and muscle depletions, prohibiting prior identification of malnutrition. Suspect pt may have had malnutrition at baseline PTA, however, acute illness likely exacerbated degree of malnutrition.   Per TOC notes, pt awaiting insurance authorization for SNF placement.   Per palliative care notes, pt family understanding of poor prognosis. Pt underwent one way extubation on 05/13/23; plan to medically optimize and transition to comfort care if pt declines. Pt is a DNR with limited interventions.   Medications reviewed and include vitamin C , folic acid , prednisone , and thiamine .   Labs reviewed: CBGS: 86-141 (inpatient orders for glycemic control are 0-9 units insulin  aspart TID before meals and at bedtime).    NUTRITION - FOCUSED PHYSICAL EXAM:  Flowsheet Row Most Recent Value  Orbital Region Moderate depletion  Upper Arm Region Mild depletion  Thoracic and Lumbar Region Mild depletion  Buccal Region Mild depletion  Temple Region Moderate depletion  Clavicle Bone Region Moderate depletion  Clavicle and Acromion Bone  Region Moderate depletion  Scapular Bone Region Moderate depletion  Dorsal Hand Moderate depletion  Patellar Region Moderate depletion  Anterior Thigh Region Moderate depletion  Posterior Calf Region Moderate depletion  Edema  (RD Assessment) Mild  Hair Reviewed  Eyes Reviewed  Mouth Reviewed  Skin Reviewed  Nails Reviewed       Diet Order:   Diet Order             DIET - DYS 1 Room service appropriate? No; Fluid consistency: Thin  Diet effective now                   EDUCATION NEEDS:   Education needs have been addressed  Skin:  Skin Assessment: Reviewed RN Assessment (Stage II buttocks and anus) Skin Integrity Issues:: Stage II Stage II: buttocks, mid anus  Last BM:  05/20/23 (type 6)  Height:   Ht Readings from Last 1 Encounters:  05/03/23 5' 7.01 (1.702 m)    Weight:   Wt Readings from Last 1 Encounters:  05/21/23 69.6 kg    Ideal Body Weight:  67.2 kg  BMI:  Body mass index is 24.03 kg/m.  Estimated Nutritional Needs:   Kcal:  2100-2300  Protein:  105-120 grams  Fluid:  > 2 L    Margery ORN, RD, LDN, CDCES Registered Dietitian III Certified Diabetes Care and Education Specialist If unable to reach this RD, please use RD Inpatient group chat on secure chat between hours of 8am-4 pm daily

## 2023-05-21 NOTE — Discharge Summary (Signed)
 [facility] Discharge Summary   Patient: Curtis [name] FMW:[id number]  PCP: Patient, No Pcp Per  Date of admission: 04/25/2023   Date of discharge:  05/21/2023     Discharge Diagnoses:  Principal Problem:   Acute metabolic encephalopathy Active Problems:   Shock circulatory (HCC)   Sepsis with encephalopathy (HCC)   Hypothermia   Non-traumatic rhabdomyolysis   AKI (acute kidney injury) (HCC)   NSTEMI (non-ST elevated myocardial infarction) (HCC)   Urinary tract infection, acute   Demand ischemia (HCC)   Atrial flutter (HCC)   Admitted From: Home Disposition:  SNF   Recommendations for Outpatient Follow-up:  Follow with PCP, patient should be seen by an MD in 1 to 2 days, continue monitor BP and titrate medications accordingly.  Patient had A-fib with RVR, started on metoprolol , Eliquis .  Due to high blood pressure start abdominal pain. Continue multivitamins, thiamine  for 30 days. Due to respiratory distress, started Breo Ellipta  inhaler and albuterol  as needed. Started Seroquel  for sundowning. Follow-up with cardiology for A-fib management as an outpatient in 1 to 2 weeks Follow up LABS/TEST:  CBC, BMP, Mag and Phos in 1 wk   Contact information for follow-up providers     Perla Evalene PARAS, MD Follow up in 1 week(s).   Specialty: Cardiology Contact information: [address] [place] [address] [phone]         PCP Follow up in 1 week(s).               Contact information for after-discharge care     Destination     [facility] Preferred SNF .   Service: Skilled Nursing Contact information: [address] [place]   [address] [phone]                    Diet recommendation: Dysphagia type 1 Thin Liquid  Activity: The patient is advised to gradually reintroduce usual activities, as tolerated  Discharge Condition: stable  Code Status: DNR/DNI -Limited   History of present  illness: As per the H and P dictated on admission Hospital Course:  68 year old male patient with a past medical history of incarcerated inguinal hernia s/p repair, SBO, alcohol withdrawal seizures, EtOH abuse who presented to [facility] on 04/24/2022 for unresponsiveness. He was found to be profoundly hypothermic with temperature measuring 77 F. Subsequently was intubated on 04/24/2022. He was actively rewarmed and started on stress dose steroids. Currently normothermic. Course also complicated by rhabdomyolysis with CK peaking at 6000 and currently downtrending. Finally course complicated by acute hypoxic respiratory failure suspecting aspiration for which she is on Zosyn  and doxycycline . Tracheal aspirate w/ Moraxella Cat Patient obstacle to extubation is mental status remains agitated with SAT.    Per ED report, EMS was dispatched to patient's residence by a neighbor who found the patient unconscious.  On EMS arrival, patient was found unresponsive on floor of bedroom. Neighbor report LKNW was 3 days ago. Patient was initially combative and would tense resist any treatment. EMS report they were unable to obtain a temperature or spo2 due to hypothermia and unable to obtain bp due to combative resistance. patient initial cbg registered at 32 treated with 1mg  glucagon repeat reading were in the upper 20's   ED Course: Initial vital signs showed HR of 55 beats/minute, BP 99/47 mm Hg, the RR 22 breaths/minute, and the oxygen saturation 100% on NRB and a temperature of 50F (25C). Patient intubated in for airway protection. Pertinent Labs/Diagnostics Findings:  Na+/ K+:131/4.8  Glucose: 286 BUN/Cr.: 31/1.91 CO2 1, Anion Gap 23, AST/ALT:284/83 WBC: 15.7K/L with neutrophil predominance  Lactic acid: >9.0 COVID PCR: Negative,  troponin: 32  CK 5249 ETOH:177 VBG: pO2 46.2; pCO2 72; pH 6.96;  HCO3 16.2, %O2 Sat 46.6.  CXR> CTH> CTA Chest> CT Abd/pelvis>see results below Medication administered in the  ZI:Ejupzwu given 30 cc/kg of fluids and started on broad-spectrum antibiotics with Ceftriaxone  for suspected sepsis with septic shock.    05/15/23- patient on diet without aspiration events.  Nonfebrile. Had episode of Afrvr overnight, now rate controlled on amio.  He does have few electrolyte abnormalities with repletion ongoing. He seems to have development of alcohol withdrawal.  Rectal tube active.  Weaned to 6L/min [place].  Starting beta blockade for af.   05/16/23- patient with signs of Tardive dykinesia, have discussed with phamacist regarding therapy.  He is less anxious/aggitated with librium  taper for EtOH withdrawal. AF is rate controlled.  Switching to po prednisone  and eliquis  . 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    Micro Data:  1/9: SARS-CoV-2/RSV/influenza PCR>> negative 1/9: HIV screen>> nonreactive 1/9: Blood culture x 2>> no growth 1/9: MRSA PCR>> negative 1/10: Respiratory viral panel>> negative 1/10: Mycoplasma pneumoniae IgM>> less than 770 1/10: Strep pneumo and Legionella urinary antigens>> negative 1/10: Tracheal aspirate>> Proteus mirabilis, Moraxella Catarrhalis (beta lactamase +) 1/11: Acute viral hepatitis panel>> nonreactive 1/13: Blood culture x 2>> no growth 1/22: RVP>> negative   Significant Hospital Events:  04/24/2022 - Intubated, admit to ICU extremely hypotensive.  04/28/2022 - Started on Librium  taper and Seroquel  BID.  04/29/2022 - Remains with significant agitation on SAT and unable to proceed with extubation.  04/30/2022 Mental status with significant improvement. Following commands. Hgb trending down now at 7.3g/dl. Stools brown. NG tube to suction and no signs of bleeding.  05/01/2022 1pRBC given. Improved mental status.  05/02/2022 Patient H&H stable. Did well on SBT however mentation remains extremley poor with significant thick secretions therefore held off on extubation.   05/03/2022 Remains with poor mental status, not doing well with SBT. My concern is that we are looking at a trach if no improvement in mental status in the next 48hrs.  05/05/2023: Initially tolerated WUA and SBT, however with BM and turning, agitated and severe hypoxia. 05/06/2023: On minimal vent settings, FAILED SBT.  Increase Seroquel  to help with agitation.  Decrease free water  flushes to 200 q4h. 05/07/2023: On minimal vent settings, perform SBT as tolerated. Diurese with 60 mg IV Lasix  x1 dose.  EXTUBATED. 05/08/2023: Pt failed extubation due to worsening acute respiratory failure secondary to excessive secretions requiring reintubation. Pt febrile with worsening leukocytosis, unasyn  started for aspiration pneumonia  05/09/2023: Pt remains mechanically intubated FiO2 55%/PEEP 5.  Currently sedated with precedex  and low dose fentanyl  gtt.  Pt bradycardic will discontinue precedex  gtt and restart propofol  gtt 05/10/2023: On 50% FiO2.  Diurese with 40 mg IV Lasix  x1 dose.  Triglycerides 702, d/c Propofol  and change to Dilaudid , Precedex  with prn versed  pushes.  Developed A.fib with RVR, converted back to NSR with Amiodarone  bous. 05/11/2023: Increased WOB on WUA. 05/12/2023: On minimal vent settings, plan for WUA and SBT when family arrives.  Will diurese with 40 mg IV Lasix  x1 dose.   05/13/23- for SBT today, opening eyes but not consistently following verbal communication.  Family conference today.  Patient re-evaluated during SBT with passing parameters and liberated from MV.  Extubated  05/13/23 - Met with family reviewed overall comorbid and poor long term prognosis.  05/14/23- patient had slp with diet ordered, behavior is better mentation imrproved   Assessment and Plan: # Acute metabolic encephalopathy, Improving  Avoid sedating medications as able,Daily wake up assessment S/p thiamine , MVI, folic acid  due to alcoholism hx. continue thiamine  and multivitamin for 30 days   # Septic shock ~  RESOLVED # Intermittent atrial fibrillation with rvr ~ CONVERTED TO NSR # Elevated Troponin, suspect demand ischemia Echocardiogram 04/26/23: LVEF >55%, diastolic function unable to be evaluated, RV systolic function mildly reduced, RV size is normal Troponin peaked at 885, TSH normal at 1.2 on 04/26/23 Seen by cardiology, started on metoprolol  and Eliquis .  Monitor BP and titrate medication accordingly follow-up with cardiology in 1 to 2 weeks.   # Acute hypoxic respiratory failure, Resolved  Extubated on 05/13/23 On 2/2 noticed worsening of shortness of breath and COPD exacerbation Patient was already on tapering dose of prednisone . So Started Brovana  and budesonide  nebulizer twice daily, DuoNeb every 6 hourly scheduled.  Supplemental O2 inhalation weaned off, currently saturating well on room air Started Breo Ellipta  inhaler and albuterol  as needed on discharge.  No need of more oral steroids.   # Aspiration pneumonia ((Trach aspirate with Moraxella Ctarrhalis and proteus Mirabilis, gram stain w/ Gram + Cocci ) ~ TREATED, s/p Zosyn  as per sensitivity report, completed course   # Paroxysmal A-fib with RVR: CHA2DS2/VAS Stroke Risk Points=2  Continue Lopressor  50 mg p.o. twice daily and Started Eliquis  5 mg p.o. twice daily Cardiology consulted, recommended to continue Lopressor  and Eliquis  and follow-up as an outpatient, signed off. # Hypertension: Continue Lopressor  50 mg p.o. twice daily, Started amlodipine  5 mg p.o. daily Monitor BP and titrate medications accordingly # Alcohol withdrawal syndrome- resolved  On librium  taper . S/p IV ativan .  No more withdrawal symptoms # AKI~Resolved  # Hypernatremia~Resolved # Hypokalemia, Resolved  # Hypomagnesemia, mag repleted today.  Started magnesium  oxide 40 mg p.o. twice daily for 2 weeks.  Repeat magnesium  level after 1 week.   # Anemia without obvious signs of bleeding  CT Abd/Pelvis 05/02/2023: without any sign of intraperitoneal or  retroperitoneal hematoma GI consulted, appreciate input: No indication for EGD currently, continue PPI BID for SUP, if further drop in Hgb recommends Tagged RBC scan. 2/4 Hb 9.9 today, stable.  Continue pantoprazole  40 mg p.o. daily.  Repeat CBC after 1 week # Hyperglycemia and Hypoglycemia~resolved     Body mass index is 24.03 kg/m.  Nutrition Problem: Severe Malnutrition Etiology: acute illness Nutrition Interventions: Interventions: Ensure Enlive (each supplement provides 350kcal and 20 grams of protein), MVI, Magic cup  Pressure Injury 05/07/23 Buttocks Right Stage 2 -  Partial thickness loss of dermis presenting as a shallow open injury with a red, pink wound bed without slough. (Active)  05/07/23 1139  Location: Buttocks  Location Orientation: Right  Staging: Stage 2 -  Partial thickness loss of dermis presenting as a shallow open injury with a red, pink wound bed without slough.  Wound Description (Comments):   Present on Admission:   Dressing Type Foam - Lift dressing to assess site every shift 05/20/23 0800     Pressure Injury 05/07/23 Anus Mid Stage 2 -  Partial thickness loss of dermis presenting as a shallow open injury with a red, pink wound bed without slough. (Active)  05/07/23 1810  Location: Anus  Location Orientation: Mid  Staging: Stage 2 -  Partial thickness loss of dermis presenting  as a shallow open injury with a red, pink wound bed without slough.  Wound Description (Comments):   Present on Admission:   Dressing Type None 05/20/23 2100     Patient was seen by physical therapy, who recommended Therapy, SNF placement, which was arranged. On the day of the discharge the patient's vitals were stable, and no other acute medical condition were reported by patient. the patient was felt safe to be discharge at [facility].  Consultants: PCCM, cardiology Procedures: s/p intubation, patient was extubated on 05/13/2023  Discharge Exam: General: Appear in no distress, no Rash;  Oral Mucosa Clear, moist. Cardiovascular: S1 and S2 Present, no Murmur, Respiratory: normal respiratory effort, Bilateral Air entry present and no Crackles, no wheezes Abdomen: Bowel Sound present, Soft and no tenderness, no hernia Extremities: no Pedal edema, no calf tenderness Neurology: AAOx 2-3, intermittent confusion, no focal deficits. affect appropriate.  Filed Weights   05/19/23 0649 05/20/23 0500 05/21/23 0517  Weight: 71.9 kg 71.4 kg 69.6 kg   Vitals:   05/21/23 0808 05/21/23 1147  BP: (!) 158/63 128/60  Pulse: 73 62  Resp:    Temp: 98.6 F (37 C) (!) 97 F (36.1 C)  SpO2: 96% 99%    DISCHARGE MEDICATION: Allergies as of 05/21/2023       Reactions   Robinul  [glycopyrrolate ] Other (See Comments)   Arrhythmia's        Medication List     TAKE these medications    albuterol  108 (90 Base) MCG/ACT inhaler Commonly known as: VENTOLIN  HFA Inhale 2 puffs into the lungs every 6 (six) hours as needed for wheezing or shortness of breath.   amLODipine  5 MG tablet Commonly known as: NORVASC  Take 1 tablet (5 mg total) by mouth daily. Start taking on: May 22, 2023   apixaban  5 MG Tabs tablet Commonly known as: ELIQUIS  Take 1 tablet (5 mg total) by mouth 2 (two) times daily.   fluticasone  furoate-vilanterol 200-25 MCG/ACT Aepb Commonly known as: Breo Ellipta  Inhale 1 puff into the lungs daily.   Magnesium  Oxide 400 MG Caps Take 1 capsule (400 mg total) by mouth 2 (two) times daily for 14 days.   metoprolol  tartrate 50 MG tablet Commonly known as: LOPRESSOR  Take 1 tablet (50 mg total) by mouth 2 (two) times daily.   multivitamin with minerals Tabs tablet Take 1 tablet by mouth daily. Start taking on: May 22, 2023   pantoprazole  40 MG tablet Commonly known as: PROTONIX  Take 1 tablet (40 mg total) by mouth daily. Start taking on: May 22, 2023   polyethylene glycol 17 g packet Commonly known as: MIRALAX  / GLYCOLAX  Take 17 g by mouth daily as  needed for moderate constipation.   QUEtiapine  25 MG tablet Commonly known as: SEROQUEL  Take 1 tablet (25 mg total) by mouth every evening.   thiamine  100 MG tablet Commonly known as: Vitamin B-1 Take 1 tablet (100 mg total) by mouth daily. Start taking on: May 22, 2023               Discharge Care Instructions  (From admission, onward)           Start     Ordered   05/21/23 0000  Discharge wound care:       Comments: As above   05/21/23 1511           Allergies  Allergen Reactions   Robinul  [Glycopyrrolate ] Other (See Comments)    Arrhythmia's   Discharge Instructions  Call MD for:  difficulty breathing, headache or visual disturbances   Complete by: As directed    Call MD for:  extreme fatigue   Complete by: As directed    Call MD for:  persistant dizziness or light-headedness   Complete by: As directed    Call MD for:  persistant nausea and vomiting   Complete by: As directed    Call MD for:  severe uncontrolled pain   Complete by: As directed    Call MD for:  temperature >100.4   Complete by: As directed    Discharge instructions   Complete by: As directed    Follow with PCP, patient should be seen by an MD in 1 to 2 days, continue monitor BP and titrate medications accordingly.  Patient had A-fib with RVR, started on metoprolol , Eliquis .  Due to high blood pressure start abdominal pain. Continue multivitamins, thiamine  for 30 days. Due to respiratory distress, started Breo Ellipta  inhaler and albuterol  as needed. Started Seroquel  for sundowning. Follow-up with cardiology for A-fib management as an outpatient in 1 to 2 weeks   Discharge wound care:   Complete by: As directed    As above   Increase activity slowly   Complete by: As directed        The results of significant diagnostics from this hospitalization (including imaging, microbiology, ancillary and laboratory) are listed below for reference.    Significant Diagnostic  Studies: DG Chest Port 1 View Result Date: 05/11/2023 CLINICAL DATA:  Respiratory failure.  Ventilator dependence. EXAM: PORTABLE CHEST 1 VIEW COMPARISON:  05/08/2023 FINDINGS: Endotracheal tube tip is 5.3 cm above the base of the carina. The NG tube passes into the stomach although the distal tip position is not included on the film. Right PICC line tip overlies the distal SVC level. The cardio pericardial silhouette is enlarged. Bibasilar collapse/consolidation noted, right greater than left with small right and tiny left pleural effusions. No acute bony abnormality. Telemetry leads overlie the chest. IMPRESSION: 1. Bibasilar collapse/consolidation, right greater than left with small right and tiny left pleural effusions. 2. Support apparatus as described. Electronically Signed   By: Camellia Candle M.D.   On: 05/11/2023 08:51   DG Abd 1 View Result Date: 05/08/2023 CLINICAL DATA:  Enteric tube placement EXAM: ABDOMEN - 1 VIEW limited for tube placement COMPARISON:  None Available. FINDINGS: Limited x-ray for tube placement of the upper abdomen has a enteric tube with tip overlying the midbody of the stomach. Minimal bowel gas elsewhere in the visualized portions of the upper abdomen. IMPRESSION: Enteric tube overlying the mid stomach. Electronically Signed   By: Ranell Bring M.D.   On: 05/08/2023 10:18   DG Chest Port 1 View Result Date: 05/08/2023 CLINICAL DATA:  ET tube placement EXAM: PORTABLE CHEST 1 VIEW COMPARISON:  05/07/2023 FINDINGS: New ET tube in place with the tip seen 4 cm above the carina. Enteric tube with the tip extending beneath the diaphragm. Stable left IJ catheter. Persistent pleural effusions, increasing bilaterally but still right-greater-than-left. Adjacent mild opacity. No pneumothorax. Stable enlarged cardiopericardial silhouette with some prominent central vasculature. Overlapping cardiac leads. IMPRESSION: New ET tube and enteric tube. Increasing pleural effusions and opacities,  right-greater-than-left Electronically Signed   By: Ranell Bring M.D.   On: 05/08/2023 10:17   US  EKG SITE RITE Result Date: 05/08/2023 If [facility] image not attached, placement could not be confirmed due to current cardiac rhythm.  DG Chest Port 1 View Result Date: 05/07/2023 CLINICAL DATA:  Hypoxia EXAM: PORTABLE CHEST 1 VIEW COMPARISON:  05/07/2023, 05/03/2023 FINDINGS: Endotracheal tube is removed. Left-sided central venous catheter tip over the SVC. Similar appearance of right pleural effusion and right basilar airspace disease. Vascular congestion. Aortic atherosclerosis. Stable cardiac size IMPRESSION: Similar appearance of right pleural effusion and right basilar airspace disease. Vascular congestion. Electronically Signed   By: Luke Bun M.D.   On: 05/07/2023 23:01   DG Chest Port 1 View Result Date: 05/07/2023 CLINICAL DATA:  [id number] Acute respiratory failure with hypoxia (HCC) [id number] EXAM: PORTABLE CHEST 1 VIEW COMPARISON:  Radiographs 05/03/2023 and 04/30/2023. CT 04/25/2023 and 05/02/2023. FINDINGS: 0501 hours. Two views submitted. The endotracheal tube, left IJ central venous catheter and enteric tube are unchanged in position. The heart size and mediastinal contours are stable. Further worsening of the right basilar consolidation and associated small right pleural effusion. Probable mild left basilar atelectasis. No evidence of pneumothorax or acute osseous abnormality. IMPRESSION: Further worsening of right basilar consolidation and associated small right pleural effusion, suspicious for pneumonia. Stable support system. Electronically Signed   By: Elsie Perone M.D.   On: 05/07/2023 09:19   MR BRAIN WO CONTRAST Result Date: 05/03/2023 CLINICAL DATA:  Mental status change, unresponsive EXAM: MRI HEAD WITHOUT CONTRAST TECHNIQUE: Multiplanar, multiecho pulse sequences of the brain and surrounding structures were obtained without intravenous contrast. COMPARISON:  06/27/2016 MRI head,  04/25/2023 CT head FINDINGS: Brain: No restricted diffusion to suggest acute or subacute infarct. No acute hemorrhage, mass, mass effect, or midline shift. No hydrocephalus or extra-axial collection. Pituitary and craniocervical junction within normal limits. No hemosiderin deposition to suggest remote hemorrhage. This scattered T2 hyperintense signal in the periventricular white matter, likely the sequela of mild chronic small vessel ischemic disease. Mildly advanced cerebral atrophy for age, without lobar predominance. Vascular: Normal arterial flow voids. Skull and upper cervical spine: Normal marrow signal. Sinuses/Orbits: Mucosal thickening throughout the paranasal sinuses, most advanced in the right maxillary sinuses. Air-fluid level in the left sphenoid sinus and right maxillary sinus, which may be secondary to intubation. No acute finding in the orbits. Remote left lamina papyracea defect. Other: Fluid in the mastoid air cells and air-fluid level in the nasopharynx, also secondary to intubation. IMPRESSION: No acute intracranial process. No evidence of acute or subacute infarct. No evidence of hypoxic ischemic injury. Electronically Signed   By: Donald Campion M.D.   On: 05/03/2023 14:09   DG Chest Port 1 View Result Date: 05/03/2023 CLINICAL DATA:  Acute respiratory failure with hypoxia. EXAM: PORTABLE CHEST 1 VIEW COMPARISON:  April 30, 2023. FINDINGS: The heart size and mediastinal contours are within normal limits. Endotracheal and nasogastric tubes are in grossly good position. Increased right basilar atelectasis or infiltrate is noted with small pleural effusion. Left lung is clear. Left internal jugular catheter is unchanged. The visualized skeletal structures are unremarkable. IMPRESSION: Increased right basilar opacity or infiltrate is noted with small right pleural effusion. Electronically Signed   By: Lynwood Landy Raddle M.D.   On: 05/03/2023 08:09   CT ABDOMEN PELVIS WO CONTRAST Result Date:  05/02/2023 CLINICAL DATA:  Concern for retroperitoneal hemorrhage. EXAM: CT ABDOMEN AND PELVIS WITHOUT CONTRAST TECHNIQUE: Multidetector CT imaging of the abdomen and pelvis was performed following the standard protocol without IV contrast. RADIATION DOSE REDUCTION: This exam was performed according to the departmental dose-optimization program which includes automated exposure control, adjustment of the mA and/or kV according to patient size and/or use of iterative reconstruction technique. COMPARISON:  CT of the chest abdomen  pelvis dated 04/25/2023. FINDINGS: Evaluation of this exam is limited in the absence of intravenous contrast. Lower chest: Partially visualized small bilateral pleural effusions. There is complete consolidation of the visualized right lower lobe, significantly progressed since the prior CT and most consistent with pneumonia. Follow-up to resolution recommended. There is coronary vascular calcification. No intra-abdominal free air.  Small perihepatic ascites. Hepatobiliary: Fatty liver. No biliary ductal dilatation. The gallbladder is predominantly contracted. No calcified gallstone. Pancreas: Unremarkable. No pancreatic ductal dilatation or surrounding inflammatory changes. Spleen: Normal in size without focal abnormality. Adrenals/Urinary Tract: The adrenal glands are unremarkable. There is no hydronephrosis or nephrolithiasis on either side. Small right renal upper pole hypodense lesion, suboptimally characterized on this CT, likely a cyst. The visualized ureters appear unremarkable. The urinary bladder is decompressed around a Foley catheter. Stomach/Bowel: Enteric tube with tip in the body of the stomach. There is colonic diverticulosis without active inflammatory changes. There is diffuse thickening of small bowel loops suspicious for enteritis. There is no bowel obstruction. Large right inguinal hernia containing loops of small bowel. The appendix is normal. Vascular/Lymphatic: Advanced  aortoiliac atherosclerotic disease. The IVC is unremarkable. No portal venous gas. There is no adenopathy. Reproductive: The prostate and seminal vesicles are grossly remarkable. No pelvic mass. Other: There is diffuse subcutaneous edema. No intraperitoneal or retroperitoneal hematoma. Musculoskeletal: Degenerative changes of the spine. No acute osseous pathology. IMPRESSION: 1. No intraperitoneal or retroperitoneal hematoma. 2. Findings likely represent an ileus. 3. Large right inguinal hernia containing loops of small bowel. No bowel obstruction. Normal appendix. 4. Colonic diverticulosis. 5. Fatty liver. 6. Complete consolidation of the visualized right lower lobe, significantly progressed since the prior CT and most consistent with pneumonia. Follow-up to resolution recommended. 7.  Aortic Atherosclerosis (ICD10-I70.0). Electronically Signed   By: Vanetta Chou M.D.   On: 05/02/2023 11:28   DG Chest Port 1 View Result Date: 04/30/2023 CLINICAL DATA:  Acute respiratory failure. EXAM: PORTABLE CHEST 1 VIEW COMPARISON:  April 28, 2023. FINDINGS: The heart size and mediastinal contours are within normal limits. Endotracheal and nasogastric tubes are in grossly good position. Left internal jugular catheter is unchanged. Left lung is clear. Minimal right basilar subsegmental atelectasis or infiltrate is noted. The visualized skeletal structures are unremarkable. IMPRESSION: Stable support apparatus. Minimal right basilar subsegmental atelectasis or infiltrate. Electronically Signed   By: Lynwood Landy Raddle M.D.   On: 04/30/2023 09:51   DG Abd Portable 1V Result Date: 04/28/2023 CLINICAL DATA:  OG tube placement EXAM: PORTABLE ABDOMEN - 1 VIEW COMPARISON:  Chest radiograph 10 minutes earlier. Abdominal radiograph dated 04/26/2023. FINDINGS: New enteric tube (not visualized on recent chest radiograph), terminating in the mid gastric body. IMPRESSION: Enteric tube terminating in the mid gastric body.  Electronically Signed   By: Pinkie Pebbles M.D.   On: 04/28/2023 22:37   DG Chest Port 1 View Result Date: 04/28/2023 CLINICAL DATA:  Endotracheal tube EXAM: PORTABLE CHEST 1 VIEW COMPARISON:  04/28/2023 at 0746 hours FINDINGS: Endotracheal tube terminates 5.8 cm above the carina. Lungs are essentially clear No pleural effusion or pneumothorax. The heart is normal in size. Left IJ venous catheter terminates at the cavoatrial junction. IMPRESSION: Endotracheal tube terminates 5.8 cm above the carina. No acute cardiopulmonary disease. Electronically Signed   By: Pinkie Pebbles M.D.   On: 04/28/2023 22:36   DG Chest Port 1 View Result Date: 04/28/2023 CLINICAL DATA:  Respiratory failure. EXAM: PORTABLE CHEST 1 VIEW COMPARISON:  04/27/2023 FINDINGS: Endotracheal tube tip is 4.9 cm  above the base of the carina. NG tube tip is in the stomach. Left IJ central line tip overlies expected region of the distal SVC. Interval increase in right base collapse/consolidation. Interval improvement in aeration at the left base. Cardiopericardial silhouette is at upper limits of normal for size. Telemetry leads overlie the chest. IMPRESSION: 1. Interval increase in right base collapse/consolidation. 2. Support apparatus as described. Electronically Signed   By: Camellia Candle M.D.   On: 04/28/2023 08:03   DG Chest Port 1 View Result Date: 04/27/2023 CLINICAL DATA:  Acute respiratory failure with hypoxia. EXAM: PORTABLE CHEST 1 VIEW COMPARISON:  04/26/2023 FINDINGS: Endotracheal tube tip is 4.1 cm above the base of the carina. NG tube tip is in the gastric fundus. Left IJ central line tip overlies the low SVC near the junction with the RA. Mild asymmetric elevation right hemidiaphragm with right base collapse/consolidation tiny right pleural effusion, more prominent in the interval. Trace retrocardiac atelectasis or infiltrate noted. Telemetry leads overlie the chest. IMPRESSION: 1. Interval increase in right base  collapse/consolidation with tiny right pleural effusion. 2. Trace retrocardiac atelectasis or infiltrate. Electronically Signed   By: Camellia Candle M.D.   On: 04/27/2023 08:13   US  SCROTUM W/DOPPLER Result Date: 04/26/2023 CLINICAL DATA:  Right testicular swelling. EXAM: SCROTAL ULTRASOUND DOPPLER ULTRASOUND OF THE TESTICLES TECHNIQUE: Complete ultrasound examination of the testicles, epididymis, and other scrotal structures was performed. Color and spectral Doppler ultrasound were also utilized to evaluate blood flow to the testicles. COMPARISON:  CT abdomen pelvis from yesterday. Scrotal ultrasound dated June 22, 2016. FINDINGS: Right testicle Measurements: 4.3 x 2.1 x 3.0 cm. No mass or microlithiasis visualized. Left testicle Measurements: 3.9 x 1.7 x 2.5 cm. No mass or microlithiasis visualized. Right epididymis:  Normal in size and appearance. Left epididymis:  Hypervascular. Hydrocele:  Small right hydrocele. Varicocele:  None visualized. Pulsed Doppler interrogation of both testes demonstrates normal low resistance arterial and venous waveforms bilaterally. Large right inguinal hernia containing bowel and fat. IMPRESSION: 1. Large right inguinal hernia containing bowel and fat. 2. Small right hydrocele. 3. Left epididymitis. Electronically Signed   By: Elsie ONEIDA Shoulder M.D.   On: 04/26/2023 18:46   DG Abd 1 View Result Date: 04/26/2023 CLINICAL DATA:  NG placement. EXAM: ABDOMEN - 1 VIEW COMPARISON:  Abdominal radiograph dated 11/01/2016. FINDINGS: Enteric tube with tip in the left upper abdomen in the region of the gastric fundus. Persistent dilated small bowel loops measure up to 5.5 cm in caliber. IMPRESSION: Enteric tube with tip in the gastric fundus. Electronically Signed   By: Vanetta Chou M.D.   On: 04/26/2023 15:22   ECHOCARDIOGRAM COMPLETE Result Date: 04/26/2023    ECHOCARDIOGRAM REPORT   Patient Name:   NATALE THOMA Date of Exam: 04/26/2023 Medical Rec #:  [id number]    Height:        67.0 in Accession #:    [id number]   Weight:       158.1 lb Date of Birth:  1956/04/02   BSA:          1.830 m Patient Age:    67 years     BP:           129/50 mmHg Patient Gender: M            HR:           91 bpm. Exam Location:  [facility] Procedure: 2D Echo, Cardiac Doppler and Color Doppler STAT ECHO Indications:     Acute  myocardial infarction -unspecified I21.9  History:         Patient has no prior history of Echocardiogram examinations.                  ETOH dependence.  Sonographer:     Christopher Furnace Referring Phys:  [id number] KHABIB DGAYLI Diagnosing Phys: Lonni Hanson MD  Sonographer Comments: Echo performed with patient supine and on artificial respirator and no apical window. IMPRESSIONS  1. Left ventricular ejection fraction, by estimation, is >55%. The left ventricle has normal function. Left ventricular endocardial border not optimally defined to evaluate regional wall motion. Left ventricular diastolic function could not be evaluated.  2. Right ventricular systolic function is mildly reduced. The right ventricular size is normal. Mildly increased right ventricular wall thickness.  3. The mitral valve is degenerative. Trivial mitral valve regurgitation. Unable to assess for mitral stenosis.  4. The aortic valve was not well visualized. Aortic valve regurgitation is not visualized. Aortic valve gradient not assessed. FINDINGS  Left Ventricle: Left ventricular ejection fraction, by estimation, is >55%. The left ventricle has normal function. Left ventricular endocardial border not optimally defined to evaluate regional wall motion. The left ventricular internal cavity size was  normal in size. There is no left ventricular hypertrophy. Left ventricular diastolic function could not be evaluated. Right Ventricle: The right ventricular size is normal. Mildly increased right ventricular wall thickness. Right ventricular systolic function is mildly reduced. Left Atrium: Left atrial size was not well visualized.  Right Atrium: Right atrial size was not well visualized. Pericardium: Trivial pericardial effusion is present. Mitral Valve: The mitral valve is degenerative in appearance. There is mild thickening of the mitral valve leaflet(s). Trivial mitral valve regurgitation. Unable to assess for mitral valve stenosis. Tricuspid Valve: The tricuspid valve is not well visualized. Tricuspid valve regurgitation is trivial. Aortic Valve: The aortic valve was not well visualized. Aortic valve regurgitation is not visualized. Aortic valve gradient not assessed. Pulmonic Valve: The pulmonic valve was not well visualized. Pulmonic valve regurgitation is not visualized. No evidence of pulmonic stenosis. Aorta: The aortic root is normal in size and structure. Pulmonary Artery: The pulmonary artery is not well seen. IAS/Shunts: No atrial level shunt detected by color flow Doppler.  LEFT VENTRICLE PLAX 2D LVIDd:         3.50 cm LVIDs:         2.10 cm LV PW:         0.90 cm LV IVS:        1.00 cm LVOT diam:     2.00 cm LVOT Area:     3.14 cm  LEFT ATRIUM         Index LA diam:    2.40 cm 1.31 cm/m   AORTA Ao Root diam: 2.40 cm  SHUNTS Systemic Diam: 2.00 cm Lonni Hanson MD Electronically signed by Lonni Hanson MD Signature Date/Time: 04/26/2023/12:34:06 PM    Final    DG Chest Port 1 View Result Date: 04/26/2023 CLINICAL DATA:  Central line placement. EXAM: PORTABLE CHEST 1 VIEW COMPARISON:  04/25/2023 FINDINGS: Endotracheal tube tip is approximately 3.9 cm above the base of the carina. NG tube tip is in the stomach. Left IJ central line tip overlies the mid to distal SVC level. The lungs are clear without focal pneumonia, edema, pneumothorax or pleural effusion. Interstitial markings are diffusely coarsened with chronic features. The cardiopericardial silhouette is within normal limits for size. Telemetry leads overlie the chest. IMPRESSION: 1. Left IJ central line  tip overlies the mid to distal SVC level. No pneumothorax. 2.  Endotracheal tube tip is approximately 3.9 cm above the base of the carina. Electronically Signed   By: Camellia Candle M.D.   On: 04/26/2023 05:55   CT CHEST ABDOMEN PELVIS WO CONTRAST Result Date: 04/25/2023 CLINICAL DATA:  Sepsis unresponsive EXAM: CT CHEST, ABDOMEN AND PELVIS WITHOUT CONTRAST TECHNIQUE: Multidetector CT imaging of the chest, abdomen and pelvis was performed following the standard protocol without IV contrast. RADIATION DOSE REDUCTION: This exam was performed according to the departmental dose-optimization program which includes automated exposure control, adjustment of the mA and/or kV according to patient size and/or use of iterative reconstruction technique. COMPARISON:  Chest x-ray 04/25/2023 FINDINGS: CT CHEST FINDINGS Cardiovascular: Limited assessment without intravenous contrast. Moderate aortic atherosclerosis. No aneurysm. Normal cardiac size. No pericardial effusion. Mediastinum/Nodes: Endotracheal tube tip is just above the carina. Enteric tube tip in the gastric fundus. No suspicious lymph nodes. Lungs/Pleura: Emphysema. Multifocal bilateral ground-glass densities consistent with respiratory infection/pneumonia. 4 mm right upper lobe pulmonary nodule on series 4, image 76. There are other small pulmonary nodules noted. Diffuse bilateral bronchial wall thickening. Mild mucous plugging in the right lower lobe. Musculoskeletal: No acute or suspicious osseous abnormality CT ABDOMEN PELVIS FINDINGS Hepatobiliary: Hepatic steatosis. Diffuse increased density in the gallbladder without calcified stone. No biliary dilatation Pancreas: Unremarkable. No pancreatic ductal dilatation or surrounding inflammatory changes. Spleen: Normal in size without focal abnormality. Adrenals/Urinary Tract: Adrenal glands are within normal limits. Kidneys show no hydronephrosis. Bladder is decompressed by Foley catheter. Stomach/Bowel: Moderate air distension of the stomach. Mild air and fluid-filled small  bowel without obstruction or acute bowel wall thickening. Negative appendix. Vascular/Lymphatic: Advanced aortic atherosclerosis. No aneurysm. No suspicious lymph nodes Reproductive: Negative prostate Other: Negative for pelvic effusion or free air. Large right inguinal hernia containing mesenteric fat and small bowel but no obstruction or incarceration. Musculoskeletal: Degenerative changes at L5-S1. No acute osseous abnormality. IMPRESSION: 1. Diffuse bilateral bronchial wall thickening with mild mucous plugging in the right lower lobe. Scattered bilateral ground-glass opacities consistent with respiratory infection/pneumonia, to include atypical organisms. 2. Emphysema. 3. Hepatic steatosis. 4. Large right inguinal hernia containing mesenteric fat and small bowel but no obstruction or incarceration. 5. Aortic atherosclerosis. 6. 4 mm right upper lobe pulmonary nodule. No follow-up needed if patient is low-risk (and has no known or suspected primary neoplasm). Non-contrast chest CT can be considered in 12 months if patient is high-risk. This recommendation follows the consensus statement: Guidelines for Management of Incidental Pulmonary Nodules Detected on CT Images: From the Fleischner Society 2017; Radiology 2017; 284:228-243. Aortic Atherosclerosis (ICD10-I70.0) and Emphysema (ICD10-J43.9). Electronically Signed   By: Luke Bun M.D.   On: 04/25/2023 21:39   CT Cervical Spine Wo Contrast Result Date: 04/25/2023 CLINICAL DATA:  Found unresponsive EXAM: CT CERVICAL SPINE WITHOUT CONTRAST TECHNIQUE: Multidetector CT imaging of the cervical spine was performed without intravenous contrast. Multiplanar CT image reconstructions were also generated. RADIATION DOSE REDUCTION: This exam was performed according to the departmental dose-optimization program which includes automated exposure control, adjustment of the mA and/or kV according to patient size and/or use of iterative reconstruction technique. COMPARISON:   None Available. FINDINGS: Alignment: Within normal limits. Skull base and vertebrae: 7 cervical segments are well visualized. Extensive disc space narrowing and osteophytic changes are seen. No acute fracture or acute facet abnormality is noted. Degenerative facet hypertrophic changes are noted as well. Soft tissues and spinal canal: Surrounding soft tissue structures are within normal limits.  Endotracheal tube and gastric catheter are seen in satisfactory position. Upper chest: Visualized lung apices are unremarkable. Other: None IMPRESSION: Extensive osteophytic and facet hypertrophic changes are noted without acute abnormality. Electronically Signed   By: Oneil Devonshire M.D.   On: 04/25/2023 21:26   CT Head Wo Contrast Result Date: 04/25/2023 CLINICAL DATA:  Mental status change, persistent or worsening. Found unresponsive EXAM: CT HEAD WITHOUT CONTRAST TECHNIQUE: Contiguous axial images were obtained from the base of the skull through the vertex without intravenous contrast. RADIATION DOSE REDUCTION: This exam was performed according to the departmental dose-optimization program which includes automated exposure control, adjustment of the mA and/or kV according to patient size and/or use of iterative reconstruction technique. COMPARISON:  05/14/2017 FINDINGS: Brain: Mild age related volume loss. Mild chronic small vessel disease throughout the deep white matter. No acute intracranial abnormality. Specifically, no hemorrhage, hydrocephalus, mass lesion, acute infarction, or significant intracranial injury. Vascular: No hyperdense vessel or unexpected calcification. Skull: No acute calvarial abnormality. Sinuses/Orbits: Mucosal thickening throughout the paranasal sinuses. 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 This is unchanged since prior study. Other: None IMPRESSION: Atrophy, chronic microvascular disease. No acute intracranial abnormality. Chronic sinusitis. Electronically Signed   By: Franky Crease M.D.   On: 04/25/2023 21:24   DG Chest Port 1 View Result Date: 04/25/2023 CLINICAL DATA:  Questionable sepsis. EXAM: PORTABLE CHEST 1 VIEW COMPARISON:  Chest x-ray 06/21/2016 FINDINGS: Endotracheal tube tip is 2.6 cm above the carina. The cardiomediastinal silhouette is within normal limits. The lungs are clear. There is no pleural effusion or pneumothorax. No acute fractures are seen. IMPRESSION: 1. Endotracheal tube tip is 2.6 cm above the carina. 2. No acute cardiopulmonary process. Electronically Signed   By: Greig Pique M.D.   On: 04/25/2023 18:53    Microbiology: No results found for this or any previous visit (from the past 240 hours).   Labs: CBC: Recent Labs  Lab 05/17/23 0500 05/18/23 0428 05/18/23 1227 05/19/23 0944 05/20/23 0520 05/21/23 0515  WBC 11.2* 8.8  --  12.9* 9.1 10.0  HGB 9.5* 7.7* 9.8* 9.2* 9.2* 9.9*  HCT 29.7* 24.9* 31.2* 29.1* 29.2* 30.2*  MCV 101.4* 103.8*  --  102.1* 100.7* 98.7  PLT 349 287  --  377 348 309   Basic Metabolic Panel: Recent Labs  Lab 05/17/23 0500 05/18/23 0428 05/19/23 0944 05/20/23 0520 05/20/23 1012 05/21/23 0515  NA 148* 146* 141 143  --  136  K 3.4* 3.0* 3.5 3.9  --  3.5  CL 112* 117* 108 108  --  104  CO2 24 20* 22 25  --  24  GLUCOSE 115* 104* 121* 94  --  106*  BUN 11 11 13 12   --  12  CREATININE 0.80 0.70 0.81 0.84  --  0.84  CALCIUM  9.0 7.8* 9.1 9.3  --  8.7*  MG 1.9 1.4* 1.5*  --  1.8 1.5*  PHOS 3.3 2.6 2.7  --  3.5 3.3   Liver Function Tests: No results for input(s): AST, ALT, ALKPHOS, BILITOT, PROT, ALBUMIN  in the last 168 hours. No results for input(s): LIPASE, AMYLASE in the last 168 hours. No results for input(s): AMMONIA in the last 168 hours. Cardiac Enzymes: Recent Labs  Lab 05/18/23 0428  CKTOTAL 25*   BNP (last 3 results) Recent Labs    05/07/23 0445  BNP 864.2*   CBG: Recent Labs  Lab 05/20/23 2005 05/21/23 0106 05/21/23 0426 05/21/23 0803 05/21/23 1140   GLUCAP 117*  86 93 95 154*    Time spent: 35 minutes  Signed:  Elvan Sor  [facility] 05/21/2023 3:14 PM

## 2023-05-21 NOTE — Progress Notes (Addendum)
 Pt just had an assisted fall in pt room at 1949. Pt stable on assessment. VSS. Per Nurse Alexandro' I assisted patient on the floor and did not hit head or his body parts .NP Modou made aware. Will continue to monitor.  Update 2024: Per NP Modou okay to proceed to discharge when EMS comes.SABRA  Update 2035: Called Zelda Chuck aunt, but went to voice mail. Spoke with Sammye Gin Daughter and made aware of the assisted fall. No concerns at this time.Daughter made aware that were still waiting for EMS to come pick pt. Will continue to monitor.  Update 2047: EMS came and pick up patient.

## 2023-05-27 ENCOUNTER — Encounter: Payer: Self-pay | Admitting: Cardiology

## 2023-05-27 ENCOUNTER — Ambulatory Visit: Payer: 59 | Attending: Cardiology | Admitting: Cardiology

## 2023-05-27 VITALS — BP 118/42 | HR 62

## 2023-05-27 DIAGNOSIS — I480 Paroxysmal atrial fibrillation: Secondary | ICD-10-CM

## 2023-05-27 DIAGNOSIS — G9341 Metabolic encephalopathy: Secondary | ICD-10-CM

## 2023-05-27 DIAGNOSIS — I2489 Other forms of acute ischemic heart disease: Secondary | ICD-10-CM | POA: Diagnosis not present

## 2023-05-27 DIAGNOSIS — I214 Non-ST elevation (NSTEMI) myocardial infarction: Secondary | ICD-10-CM

## 2023-05-27 DIAGNOSIS — I10 Essential (primary) hypertension: Secondary | ICD-10-CM

## 2023-05-27 DIAGNOSIS — D649 Anemia, unspecified: Secondary | ICD-10-CM

## 2023-05-27 MED ORDER — AMLODIPINE BESYLATE 2.5 MG PO TABS: 2.5000 mg | ORAL_TABLET | Freq: Every day | ORAL | 1 refills | Status: AC

## 2023-05-27 NOTE — Progress Notes (Signed)
 Cardiology Office Note:  .   Date:  05/27/2023  ID:  Clemetine Cypher, DOB [birth year], MRN [id number] PCP: Patient, No Pcp Per  [facility] Providers Cardiologist:  Sammy Crisp, MD    History of Present Illness: .   Loghan Markel is a 68 y.o. male with past medical history of EtOH abuse, who was recently hospitalized and found to have elevated high-sensitivity troponin level in the setting of hypothermia, lactic acidosis, and respiratory failure that required intubation, who presents today for hospital follow-up.   Admitted to [facility] on 04/25/2023 was intubated and sedated.  Notes indicated on 1/9 he was found by a neighbor and is on heated home that was unresponsive.  EMS was unable to obtain a temperature SpO2 due to hypothermia.  Initial CBG was 34.  He was transported to the emergency department where arrival temperature was 77 F, blood pressure 100/71, SpO2 of 100% on nonrebreather.  Initial pH was 7.07.  He was intubated for airway protection.  ECG revealed sinus bradycardia with a rate of 53 with first-degree AV block and IVCD with prolonged QT with QTc 596.  Labs were notable for sodium of 131, chloride 92, CO2 16, BUN/creatinine were elevated above prior baseline at 31-1.91, AST/ALT 284/83, lactic acid greater than 9, CK 5249, high-sensitivity troponin 32, 573, 665, COVID-negative, and ethanol alcohol of 177.  Echocardiogram that was performed revealed normal LV/RV function without significant valvular disease.  Troponin elevation was most likely consistent with demand ischemia.  Troponin was continue to trend until peak and he was started on heparin  infusion that would run for 48 hours.  He continued to be rewarmed once became normal Thursday because course was complicated by rhabdomyolysis with a CK peaking at 6000 and started to downtrend.  His course was complicated by acute hypoxic respiratory failure suspecting aspiration for which he was treated with Zosyn  and doxycycline .  Tracheal  aspirate grew Proteus Mirabella's, Moraxella Catarrhalis.  115 his hemoglobin down trended to 7.3 and he was transfused 1 unit of packed red blood cells on 1/16.  On 1/17 hemoglobin was stable.  He did well on SBT however mentation remains extremely poor with significant thick secretions to extubation was held all food.  He was diuresed on 1/21 with 60 mg of IV Lasix  x 1 dose and was able to be extubated.  1/22 he failed extubation due to worsening acute respiratory failure secondary to excessive secretions requiring reintubation.  He was then extubated on 1/27 and there was a meeting with family and it was decided he had a poor long-term prognosis.  He had been intermittent in atrial fibrillation with RVR that converted to sinus rhythm.  For CHA2DS2-VASc score of 2 he was started on apixaban  5 mg twice daily and he was continued on metoprolol  50 mg twice daily.  Eventually had to be started on amlodipine  5 mg for blood pressure.  Alcohol withdrawal symptoms were treated with Librium  taper and IV Ativan .  He had electrolyte derangements that required supplementation of magnesium  and potassium.  GI was consulted for his anemia without signs of obvious bleeding and there was no indication for EGD he was currently treated with PPI twice daily if there was further drops in hemoglobin they recommended tagged RBC scan.  He was considered stable and was discharged to SNF on 05/21/23.   He returns to clinic today dropped off by transportation from SNF swelling and weight close.  He states that he is tired and uncomfortable.  Denies any chest  pain or shortness of breath.  States that he has been compliant with his medication as it was given to him at the facility.  Likely has not missed any of his apixaban .  He denies any bleeding or noted blood in his stool or urine.  Unfortunately he is not very talkative today and there is no family available with him.  ROS: 10 point review of systems has been reviewed and considered  negative with exception was been listed in the HPI  Studies Reviewed: Aaron Aas   EKG Interpretation Date/Time:  Monday May 27 2023 10:50:17 EST Ventricular Rate:  62 PR Interval:  148 QRS Duration:  78 QT Interval:  412 QTC Calculation: 418 R Axis:   42  Text Interpretation: Normal sinus rhythm Normal ECG When compared with ECG of 18-May-2023 14:23, Confirmed by Ronald Cockayne ([id number]) on 05/27/2023 10:59:07 AM    2D echo 04/26/2023 1. Left ventricular ejection fraction, by estimation, is >55%. The left  ventricle has normal function. Left ventricular endocardial border not  optimally defined to evaluate regional wall motion. Left ventricular  diastolic function could not be  evaluated.   2. Right ventricular systolic function is mildly reduced. The right  ventricular size is normal. Mildly increased right ventricular wall  thickness.   3. The mitral valve is degenerative. Trivial mitral valve regurgitation.  Unable to assess for mitral stenosis.   4. The aortic valve was not well visualized. Aortic valve regurgitation  is not visualized. Aortic valve gradient not assessed.   Risk Assessment/Calculations:    CHA2DS2-VASc Score = 2   This indicates a 2.2% annual risk of stroke. The patient's score is based upon: CHF History: 0 HTN History: 1 Diabetes History: 0 Stroke History: 0 Vascular Disease History: 0 Age Score: 1 Gender Score: 0            Physical Exam:   VS:  BP (!) 118/42 (BP Location: Left Arm, Patient Position: Sitting, Cuff Size: Normal)   Pulse 62    Wt Readings from Last 3 Encounters:  05/21/23 153 lb 7 oz (69.6 kg)  02/06/22 163 lb 2.3 oz (74 kg)  05/14/17 163 lb 2.3 oz (74 kg)    GEN: Well nourished, well developed in no acute distress NECK: No JVD; No carotid bruits CARDIAC: RRR, no murmurs, rubs, gallops RESPIRATORY:  Clear to auscultation without rales, wheezing or rhonchi  ABDOMEN: Soft, non-tender, non-distended EXTREMITIES:  No edema; No  deformity   ASSESSMENT AND PLAN: .   Elevated high-sensitivity troponin during recent hospitalization likely demand ischemia as patient was admitted with unresponsiveness in the setting of profound hypothermia, hypoglycemia, hypotension, and alcohol intoxication.  ECG had IVCD with a first-degree AV block and prolonged QT.  High-sensitivity troponins peaked at 665 and had a CK of 50-49.  Echocardiogram revealed normal LV/RV function without significant valvular abnormalities.  Her troponin was likely consistent with demand ischemia and rhabdo likely playing a role.  Heparin  and aspirin  had to be held secondary to thrombocytopenia.  Pressures were soft but stable as he required no beta-blocker therapy.  He could also not be started on statin in the setting of LFT abnormalities with an LDL of only 46.  It was decided that pending recovery can consider outpatient ischemic evaluation for restratification.  EKG today revealed sinus rhythm with a rate of 62 with no ischemic changes noted.  Patient is in no condition to undergo any further ischemic testing at this time.  He is continued on apixaban  5 mg  twice daily and lieu of aspirin .  In attempting to have the labs drawn today for CBC BMP mag and phosphorus if LFTs have improved can consider starting him on low-dose statin therapy.  Paroxysmal atrial fibrillation with RVR that converted to sinus rhythm.  He has been continued on apixaban  5 mg twice daily for CHA2DS2-VASc score of at least 2 for stroke prophylaxis and tartrate 50 mg twice daily for rate control.  EKG today reveals sinus rhythm.  Hypertension where he is continued on Lopressor  50 mg twice daily previously was started on amlodipine  5 mg daily which is being decreased to 2.5 mg daily for blood pressure of 118/42.  Blood pressures are recommended to be taken 1 to 2 hours postmedication administration at the facility.  Further titration of medication depending on blood pressures.  Anemia without  obvious signs of bleeding with the last hemoglobin of 9.9.  It had remained stable.  He is being scheduled for hospital follow-up CBC today if he had any continued drop in hemoglobin it was recommended that he have scans completed to determine if there is any bleeding.  He is continued on Protonix  40 mg daily.  Will likely need follow-up with GI if continued drop in hemoglobin.  Acute metabolic encephalopathy continues to improve but he is still not very interactive today during his visit.  Avoid sedating medications as able.  He has been continued magnesium , multivitamin with minerals, thiamine .  These should be continued at least for 30 days postdischarge with his alcoholism history.       Dispo: Patient return to clinic to see MD/APP in 6 weeks or sooner if needed for further evaluation.  Signed, Katsumi Wisler, NP

## 2023-05-27 NOTE — Patient Instructions (Signed)
 Medication Instructions:  DECREASE the Amlodipine  to 2.5 mg once daily  *If you need a refill on your cardiac medications before your next appointment, please call your pharmacy*   Lab Work: Your provider would like for you to have the following labs today: CBC, BMET, Magnesium , Phosphorus  If you have labs (blood work) drawn today and your tests are completely normal, you will receive your results only by: MyChart Message (if you have MyChart) OR A paper copy in the mail If you have any lab test that is abnormal or we need to change your treatment, we will call you to review the results.   Testing/Procedures: None ordered   Follow-Up: At [facility], you and your health needs are our priority.  As part of our continuing mission to provide you with exceptional heart care, we have created designated Provider Care Teams.  These Care Teams include your primary Cardiologist (physician) and Advanced Practice Providers (APPs -  Physician Assistants and Nurse Practitioners) who all work together to provide you with the care you need, when you need it.  We recommend signing up for the patient portal called "MyChart".  Sign up information is provided on this After Visit Summary.  MyChart is used to connect with patients for Virtual Visits (Telemedicine).  Patients are able to view lab/test results, encounter notes, upcoming appointments, etc.  Non-urgent messages can be sent to your provider as well.   To learn more about what you can do with MyChart, go to [email or web address].    Your next appointment:   6 week(s)  Provider:   You may see Sammy Crisp, MD or one of the following Advanced Practice Providers on your designated Care Team:   Ronald Cockayne, NP

## 2023-06-17 ENCOUNTER — Telehealth: Payer: Self-pay | Admitting: Internal Medicine

## 2023-06-17 NOTE — Telephone Encounter (Signed)
 error

## 2023-06-27 ENCOUNTER — Telehealth: Payer: Self-pay | Admitting: Cardiology

## 2023-06-27 NOTE — Telephone Encounter (Signed)
 Left a message to call back. Forms have not been received.

## 2023-06-27 NOTE — Telephone Encounter (Signed)
 Tiffany from [facility] calling to see if we received plan of care faxed over on 3/4. Requesting cb

## 2023-07-02 NOTE — Telephone Encounter (Signed)
 Left a message to call back. Forms have not been received.

## 2023-07-16 ENCOUNTER — Ambulatory Visit: Payer: 59 | Attending: Cardiology | Admitting: Cardiology

## 2023-07-16 ENCOUNTER — Encounter: Payer: Self-pay | Admitting: Cardiology

## 2023-07-16 VITALS — BP 132/56 | HR 71 | Ht 67.0 in | Wt 149.2 lb

## 2023-07-16 DIAGNOSIS — I10 Essential (primary) hypertension: Secondary | ICD-10-CM | POA: Diagnosis not present

## 2023-07-16 DIAGNOSIS — I480 Paroxysmal atrial fibrillation: Secondary | ICD-10-CM

## 2023-07-16 DIAGNOSIS — G9341 Metabolic encephalopathy: Secondary | ICD-10-CM

## 2023-07-16 DIAGNOSIS — F1011 Alcohol abuse, in remission: Secondary | ICD-10-CM

## 2023-07-16 DIAGNOSIS — D649 Anemia, unspecified: Secondary | ICD-10-CM

## 2023-07-16 NOTE — Patient Instructions (Signed)
 Medication Instructions:  Your physician recommends that you continue on your current medications as directed. Please refer to the Current Medication list given to you today.  *If you need a refill on your cardiac medications before your next appointment, please call your pharmacy*  Lab Work: Your provider would like for you to have following labs drawn today CBC, BMP,Mag,PO4.   If you have labs (blood work) drawn today and your tests are completely normal, you will receive your results only by: MyChart Message (if you have MyChart) OR A paper copy in the mail If you have any lab test that is abnormal or we need to change your treatment, we will call you to review the results.  Testing/Procedures: No test ordered today   Follow-Up: At [facility], you and your health needs are our priority.  As part of our continuing mission to provide you with exceptional heart care, our providers are all part of one team.  This team includes your primary Cardiologist (physician) and Advanced Practice Providers or APPs (Physician Assistants and Nurse Practitioners) who all work together to provide you with the care you need, when you need it.  Your next appointment:   3 month(s)  Provider:   You may see Yvonne Kendall, MD or one of the following Advanced Practice Providers on your designated Care Team:   Nicolasa Ducking, NP Ames Dura, PA-C Eula Listen, PA-C Cadence Harold, PA-C Charlsie Quest, NP Carlos Levering, NP    We recommend signing up for the patient portal called "MyChart".  Sign up information is provided on this After Visit Summary.  MyChart is used to connect with patients for Virtual Visits (Telemedicine).  Patients are able to view lab/test results, encounter notes, upcoming appointments, etc.  Non-urgent messages can be sent to your provider as well.   To learn more about what you can do with MyChart, go to [email or web address].

## 2023-07-16 NOTE — Progress Notes (Signed)
 Cardiology Office Note:  .   Date:  07/16/2023  ID:  Curtis [name], DOB March 19, 1956, MRN [id number] PCP: Curtis Leatherwood, FNP  [facility] Providers Cardiologist:  Curtis Kendall, MD    History of Present Illness: .   Curtis [name] is a 68 y.o. male with a past medical history of EtOH abuse, paroxysmal atrial fibrillation, primary hypertension, anemia, seizures, who is here today to follow-up on his paroxysmal atrial fibrillation.   Admitted to [facility] on 04/25/2023 was intubated and sedated.  Notes indicated on 1/9 he was found by a neighbor and is on heated home that was unresponsive.  EMS was unable to obtain a temperature SpO2 due to hypothermia.  Initial CBG was 34.  He was transported to the emergency department where arrival temperature was 77 F, blood pressure 100/71, SpO2 of 100% on nonrebreather.  Initial pH was 7.07.  He was intubated for airway protection.  ECG revealed sinus bradycardia with a rate of 53 with first-degree AV block and IVCD with prolonged QT with QTc 596.  Labs were notable for sodium of 131, chloride 92, CO2 16, BUN/creatinine were elevated above prior baseline at 31-1.91, AST/ALT 284/83, lactic acid greater than 9, CK 5249, high-sensitivity troponin 32, 573, 665, COVID-negative, and ethanol alcohol of 177.  Echocardiogram that was performed revealed normal LV/RV function without significant valvular disease.  Troponin elevation was most likely consistent with demand ischemia.  Troponin was continue to trend until peak and he was started on heparin infusion that would run for 48 hours.  He continued to be rewarmed once became normal Thursday because course was complicated by rhabdomyolysis with a CK peaking at 6000 and started to downtrend.  His course was complicated by acute hypoxic respiratory failure suspecting aspiration for which he was treated with Zosyn and doxycycline.  Tracheal aspirate grew Proteus Mirabella's, Moraxella Catarrhalis.  115 his hemoglobin down  trended to 7.3 and he was transfused 1 unit of packed red blood cells on 1/16.  On 1/17 hemoglobin was stable.  He did well on SBT however mentation remains extremely poor with significant thick secretions to extubation was held all food.  He was diuresed on 1/21 with 60 mg of IV Lasix x 1 dose and was able to be extubated.  1/22 he failed extubation due to worsening acute respiratory failure secondary to excessive secretions requiring reintubation.  He was then extubated on 1/27 and there was a meeting with family and it was decided he had a poor long-term prognosis.  He had been intermittent in atrial fibrillation with RVR that converted to sinus rhythm.  For CHA2DS2-VASc score of 2 he was started on apixaban 5 mg twice daily and he was continued on metoprolol 50 mg twice daily.  Eventually had to be started on amlodipine 5 mg for blood pressure.  Alcohol withdrawal symptoms were treated with Librium taper and IV Ativan.  He had electrolyte derangements that required supplementation of magnesium and potassium.  GI was consulted for his anemia without signs of obvious bleeding and there was no indication for EGD he was currently treated with PPI twice daily if there was further drops in hemoglobin they recommended tagged RBC scan.  He was considered stable and was discharged to SNF on 05/21/23.   He was last seen in clinic 05/27/2023 for hospital follow-up.  He had denied any chest pain or shortness of breath.  Had likely been compliant with his medications to be given to him at the facility.  He was  not very talkative and there was no family available to assist.  He was sent for follow-up labs.  And his amlodipine was decreased from 5 mg daily to 2.5 mg daily for softer blood pressures.  He returns to clinic today stating that he has been discharged from rehab and is back at home.  He states that he has been compliant with his medications and his sister has been helping him.  He does have upcoming appointment  with a new PCP but does not have an appointment until June but was given refills on his medication to bridge him until that time.  He denies any chest pain, shortness breath, or peripheral edema.  He states that occasionally when he wakes in the morning he does have a sore throat.  He states that he has been compliant with his current medications and has not missed any doses of his apixaban.  He denies any bleeding with no blood noted in his urine or stool.  Denies any recurrent hospitalizations or visits to the emergency department.  ROS: 10 point review of system has been reviewed and considered negative except what is been listed in the HPI  Studies Reviewed: .        2D echo 04/26/2023 1. Left ventricular ejection fraction, by estimation, is >55%. The left  ventricle has normal function. Left ventricular endocardial border not  optimally defined to evaluate regional wall motion. Left ventricular  diastolic function could not be  evaluated.   2. Right ventricular systolic function is mildly reduced. The right  ventricular size is normal. Mildly increased right ventricular wall  thickness.   3. The mitral valve is degenerative. Trivial mitral valve regurgitation.  Unable to assess for mitral stenosis.   4. The aortic valve was not well visualized. Aortic valve regurgitation  is not visualized. Aortic valve gradient not assessed.    Risk Assessment/Calculations:    CHA2DS2-VASc Score = 2   This indicates a 2.2% annual risk of stroke. The patient's score is based upon: CHF History: 0 HTN History: 1 Diabetes History: 0 Stroke History: 0 Vascular Disease History: 0 Age Score: 1 Gender Score: 0            Physical Exam:   VS:  BP (!) 132/56   Pulse 71   Ht 5\' 7"  (1.702 m)   Wt 149 lb 3.2 oz (67.7 kg)   SpO2 97%   BMI 23.37 kg/m    Wt Readings from Last 3 Encounters:  07/16/23 149 lb 3.2 oz (67.7 kg)  05/21/23 153 lb 7 oz (69.6 kg)  02/06/22 163 lb 2.3 oz (74 kg)    GEN:  Well nourished, well developed in no acute distress NECK: No JVD; No carotid bruits CARDIAC: RRR, no murmurs, rubs, gallops RESPIRATORY:  Clear to auscultation without rales, wheezing or rhonchi  ABDOMEN: Soft, non-tender, non-distended EXTREMITIES:  No edema; No deformity   ASSESSMENT AND PLAN: .   Paroxysmal atrial fibrillation-converted to sinus rhythm.  Remains with a regular rate and rhythm today on exam.  He is continued on rivaroxaban 5 mg twice daily for CHA2DS2-VASc of at least 2 for stroke prophylaxis and continued on metoprolol titrate 50 mg twice daily.  Denies any palpitations or bleeding and is tolerating apixaban without adverse events.  Primary hypertension with blood pressure today 132/56. Blood pressure has remained stable. He has ben continued on amlodipine 2.5 daily and metoprolol titrate 50 mg twice daily.  He is encouraged to continue to take his medication  followed by monitoring his blood pressure 1 to 2 hours.  Anemia without obvious signs of bleeding with his last hemoglobin of 9.9.  He has been sent for an updated CBC today as he has been on blood thinners.  He denies any blood or noted blood in his urine or stool.  Previously had been ordered for the last with a manage on prior to him leaving the office will have his labs repeated today.  Metabolic encephalopathy that has improved.  He is talkative and alert and oriented today.  He is being sent for a BMP magnesium and phosphorus today that were previously ordered at his last visit that were not drawn.  He is no longer on magnesium supplements multivitamins with minerals or thiamine.  History of alcohol abuse where he states he is not drinking any further alcohol since his hospital discharge.  Encouraged to continue with this practice and congratulated on abstaining.       Dispo: Patient return to see MD/APP in 3 months or sooner if needed for further evaluation  Signed, Carlosdaniel Grob, NP

## 2023-07-17 LAB — BASIC METABOLIC PANEL WITH GFR
BUN/Creatinine Ratio: 17 (ref 10–24)
BUN: 12 mg/dL (ref 8–27)
CO2: 24 mmol/L (ref 20–29)
Calcium: 10.2 mg/dL (ref 8.6–10.2)
Chloride: 101 mmol/L (ref 96–106)
Creatinine, Ser: 0.71 mg/dL — ABNORMAL LOW (ref 0.76–1.27)
Glucose: 83 mg/dL (ref 70–99)
Potassium: 4.6 mmol/L (ref 3.5–5.2)
Sodium: 138 mmol/L (ref 134–144)
eGFR: 99 mL/min/{1.73_m2} (ref >59)

## 2023-07-17 LAB — CBC
Hematocrit: 42.3 % (ref 37.5–51.0)
Hemoglobin: 13.7 g/dL (ref 13.0–17.7)
MCH: 30.8 pg (ref 26.6–33.0)
MCHC: 32.4 g/dL (ref 31.5–35.7)
MCV: 95 fL (ref 79–97)
Platelets: 227 10*3/uL (ref 150–450)
RBC: 4.45 x10E6/uL (ref 4.14–5.80)
RDW: 13 % (ref 11.6–15.4)
WBC: 8 10*3/uL (ref 3.4–10.8)

## 2023-07-17 LAB — MAGNESIUM: Magnesium: 1.7 mg/dL (ref 1.6–2.3)

## 2023-07-17 LAB — ALKALINE PHOSPHATASE: Alkaline Phosphatase: 91 IU/L (ref 44–121)

## 2023-07-17 NOTE — Progress Notes (Signed)
 Kidney function has remained stable.  No continued concerns for bleeding as anemia has resolved.  Continue with current medication regimen with no further changes at this time.

## 2023-07-19 ENCOUNTER — Encounter: Payer: Self-pay | Admitting: *Deleted

## 2023-09-11 ENCOUNTER — Telehealth: Payer: Self-pay

## 2023-09-11 NOTE — Telephone Encounter (Signed)
 Received faxed request for Home Health orders for patient. Called to advise that orders need to go to PCP Laymond Priestly, FNP - left message to return call to the office

## 2023-10-15 ENCOUNTER — Ambulatory Visit: Attending: Cardiology | Admitting: Cardiology

## 2023-10-15 NOTE — Progress Notes (Deleted)
 Cardiology Office Note   Date:  10/15/2023  ID:  Curtis [name], DOB 08/09/1955, MRN [id number] PCP: Camelia Sherwood BIRCH, FNP  [facility] Providers Cardiologist:  Lonni Hanson, MD { Click to update primary MD,subspecialty MD or APP then REFRESH:1}    History of Present Illness Curtis [name] is a 68 y.o. male with a past medical history of EtOH abuse, paroxysmal atrial fibrillation, primary hypertension, anemia, seizure, who presents today for follow-up of his paroxysmal atrial fibrillation.   Admitted to [facility] on 04/25/2023 was intubated and sedated.  Notes indicated on 1/9 he was found by a neighbor and is on heated home that was unresponsive.  EMS was unable to obtain a temperature SpO2 due to hypothermia.  Initial CBG was 34.  He was transported to the emergency department where arrival temperature was 77 F, blood pressure 100/71, SpO2 of 100% on nonrebreather.  Initial pH was 7.07.  He was intubated for airway protection.  ECG revealed sinus bradycardia with a rate of 53 with first-degree AV block and IVCD with prolonged QT with QTc 596.  Labs were notable for sodium of 131, chloride 92, CO2 16, BUN/creatinine were elevated above prior baseline at 31-1.91, AST/ALT 284/83, lactic acid greater than 9, CK 5249, high-sensitivity troponin 32, 573, 665, COVID-negative, and ethanol alcohol of 177.  Echocardiogram that was performed revealed normal LV/RV function without significant valvular disease.  Troponin elevation was most likely consistent with demand ischemia.  Troponin was continue to trend until peak and he was started on heparin  infusion that would run for 48 hours.  He continued to be rewarmed once became normal Thursday because course was complicated by rhabdomyolysis with a CK peaking at 6000 and started to downtrend.  His course was complicated by acute hypoxic respiratory failure suspecting aspiration for which he was treated with Zosyn  and doxycycline .  Tracheal aspirate grew Proteus  Mirabella's, Moraxella Catarrhalis.  115 his hemoglobin down trended to 7.3 and he was transfused 1 unit of packed red blood cells on 1/16.  On 1/17 hemoglobin was stable.  He did well on SBT however mentation remains extremely poor with significant thick secretions to extubation was held all food.  He was diuresed on 1/21 with 60 mg of IV Lasix  x 1 dose and was able to be extubated.  1/22 he failed extubation due to worsening acute respiratory failure secondary to excessive secretions requiring reintubation.  He was then extubated on 1/27 and there was a meeting with family and it was decided he had a poor long-term prognosis.  He had been intermittent in atrial fibrillation with RVR that converted to sinus rhythm.  For CHA2DS2-VASc score of 2 he was started on apixaban  5 mg twice daily and he was continued on metoprolol  50 mg twice daily.  Eventually had to be started on amlodipine  5 mg for blood pressure.  Alcohol withdrawal symptoms were treated with Librium  taper and IV Ativan .  He had electrolyte derangements that required supplementation of magnesium  and potassium.  GI was consulted for his anemia without signs of obvious bleeding and there was no indication for EGD he was currently treated with PPI twice daily if there was further drops in hemoglobin they recommended tagged RBC scan.  He was considered stable and was discharged to SNF on 05/21/23.  He was seen in clinic 05/27/2023 for hospital follow-up.  Denied chest pain or shortness of breath.  Amlodipine  was decreased from 5 mg daily to 2.5 mg daily for softer blood pressures.   He  was last seen in clinic July 16, 2023 where he had been discharged from rehab and was back.  He states that he been compliant with his medications with him.  He had been compliant with his apixaban  and had not noted any bleeding.  Had not had any further hospitalizations or visits to the emergency department since being discharged back home.  He returns to clinic  today  ROS: 10 point review of system has been reviewed and considered negative except ones are listed in the HPI  Studies Reviewed      2D echo 04/26/2023 1. Left ventricular ejection fraction, by estimation, is >55%. The left  ventricle has normal function. Left ventricular endocardial border not  optimally defined to evaluate regional wall motion. Left ventricular  diastolic function could not be  evaluated.   2. Right ventricular systolic function is mildly reduced. The right  ventricular size is normal. Mildly increased right ventricular wall  thickness.   3. The mitral valve is degenerative. Trivial mitral valve regurgitation.  Unable to assess for mitral stenosis.   4. The aortic valve was not well visualized. Aortic valve regurgitation  is not visualized. Aortic valve gradient not assessed.  Risk Assessment/Calculations  CHA2DS2-VASc Score = 2  {Confirm score is correct.  If not, click here to update score.  REFRESH note.  :1} This indicates a 2.2% annual risk of stroke. The patient's score is based upon: CHF History: 0 HTN History: 1 Diabetes History: 0 Stroke History: 0 Vascular Disease History: 0 Age Score: 1 Gender Score: 0   {This patient has a significant risk of stroke if diagnosed with atrial fibrillation.  Please consider VKA or DOAC agent for anticoagulation if the bleeding risk is acceptable.   You can also use the SmartPhrase .[email or web address] for documentation.   :[id number]} No BP recorded.  {Refresh Note OR Click here to enter BP  :1}***       Physical Exam VS:  There were no vitals taken for this visit.       Wt Readings from Last 3 Encounters:  07/16/23 149 lb 3.2 oz (67.7 kg)  05/21/23 153 lb 7 oz (69.6 kg)  02/06/22 163 lb 2.3 oz (74 kg)    GEN: Well nourished, well developed in no acute distress NECK: No JVD; No carotid bruits CARDIAC: ***RRR, no murmurs, rubs, gallops RESPIRATORY:  Clear to auscultation without rales, wheezing or rhonchi   ABDOMEN: Soft, non-tender, non-distended EXTREMITIES:  No edema; No deformity   ASSESSMENT AND PLAN Paroxysmal atrial fibrillation Primary hypertension Anemia History of alcohol abuse    {Are you ordering a CV Procedure (e.g. stress test, cath, DCCV, TEE, etc)?   Press F2        :[id number]}  Dispo: ***  Signed, Carmilla Granville, NP

## 2023-11-25 ENCOUNTER — Telehealth: Payer: Self-pay | Admitting: Internal Medicine

## 2023-11-25 NOTE — Telephone Encounter (Signed)
 Pt's HH nurse would like a c/b regarding if provider is willing to sign off on pt's [facility] Plan of Care Order. Please advise

## 2023-11-25 NOTE — Telephone Encounter (Signed)
 Advised that orders should be sent to his PCP.
